# Patient Record
Sex: Female | Born: 1950
Health system: Southern US, Community
[De-identification: ages and names within clinical notes are randomized; demographics above are authoritative.]

## PROBLEM LIST (undated history)

## (undated) DIAGNOSIS — I10 Essential (primary) hypertension: Secondary | ICD-10-CM

## (undated) DIAGNOSIS — I509 Heart failure, unspecified: Secondary | ICD-10-CM

## (undated) HISTORY — PX: COLONOSCOPY: SHX174

## (undated) HISTORY — PX: ROTATOR CUFF REPAIR: SHX139

---

## 2000-04-16 DIAGNOSIS — I119 Hypertensive heart disease without heart failure: Secondary | ICD-10-CM | POA: Insufficient documentation

## 2003-12-29 ENCOUNTER — Ambulatory Visit: Payer: Self-pay | Admitting: Unknown Physician Specialty

## 2004-04-03 ENCOUNTER — Emergency Department: Payer: Self-pay | Admitting: Emergency Medicine

## 2004-06-02 ENCOUNTER — Emergency Department: Payer: Self-pay | Admitting: Unknown Physician Specialty

## 2004-06-03 ENCOUNTER — Emergency Department: Payer: Self-pay | Admitting: Emergency Medicine

## 2004-08-29 ENCOUNTER — Emergency Department: Payer: Self-pay | Admitting: Emergency Medicine

## 2004-09-04 ENCOUNTER — Emergency Department: Payer: Self-pay | Admitting: Emergency Medicine

## 2004-12-11 ENCOUNTER — Emergency Department: Payer: Self-pay | Admitting: Emergency Medicine

## 2004-12-20 ENCOUNTER — Other Ambulatory Visit: Payer: Self-pay

## 2005-01-31 ENCOUNTER — Ambulatory Visit: Payer: Self-pay | Admitting: General Practice

## 2005-04-20 ENCOUNTER — Emergency Department: Payer: Self-pay | Admitting: Emergency Medicine

## 2006-08-27 ENCOUNTER — Emergency Department: Payer: Self-pay

## 2006-08-27 ENCOUNTER — Other Ambulatory Visit: Payer: Self-pay

## 2006-10-29 ENCOUNTER — Ambulatory Visit: Payer: Self-pay | Admitting: Family Medicine

## 2006-11-06 ENCOUNTER — Ambulatory Visit: Payer: Self-pay | Admitting: Family Medicine

## 2007-04-27 ENCOUNTER — Other Ambulatory Visit: Payer: Self-pay

## 2007-04-27 ENCOUNTER — Emergency Department: Payer: Self-pay | Admitting: Emergency Medicine

## 2007-04-28 ENCOUNTER — Emergency Department: Payer: Self-pay | Admitting: Emergency Medicine

## 2007-09-18 ENCOUNTER — Other Ambulatory Visit: Payer: Self-pay

## 2007-09-18 ENCOUNTER — Emergency Department: Payer: Self-pay | Admitting: Emergency Medicine

## 2007-12-21 ENCOUNTER — Emergency Department: Payer: Self-pay | Admitting: Unknown Physician Specialty

## 2009-01-26 ENCOUNTER — Ambulatory Visit: Payer: Self-pay

## 2009-03-04 ENCOUNTER — Emergency Department: Payer: Self-pay | Admitting: Emergency Medicine

## 2009-03-07 ENCOUNTER — Emergency Department: Payer: Self-pay | Admitting: Emergency Medicine

## 2009-05-24 ENCOUNTER — Emergency Department: Payer: Self-pay | Admitting: Emergency Medicine

## 2009-06-28 ENCOUNTER — Ambulatory Visit: Payer: Self-pay | Admitting: Orthopedic Surgery

## 2010-03-11 ENCOUNTER — Ambulatory Visit: Payer: Self-pay

## 2010-05-24 ENCOUNTER — Emergency Department: Payer: Self-pay | Admitting: Emergency Medicine

## 2010-10-17 DIAGNOSIS — I5022 Chronic systolic (congestive) heart failure: Secondary | ICD-10-CM | POA: Insufficient documentation

## 2011-01-16 ENCOUNTER — Emergency Department: Payer: Self-pay | Admitting: *Deleted

## 2011-01-16 LAB — COMPREHENSIVE METABOLIC PANEL
Albumin: 3.7 g/dL (ref 3.4–5.0)
Alkaline Phosphatase: 86 U/L (ref 50–136)
Anion Gap: 5 — ABNORMAL LOW (ref 7–16)
BUN: 20 mg/dL — ABNORMAL HIGH (ref 7–18)
Bilirubin,Total: 0.4 mg/dL (ref 0.2–1.0)
Calcium, Total: 9.2 mg/dL (ref 8.5–10.1)
Chloride: 108 mmol/L — ABNORMAL HIGH (ref 98–107)
Co2: 29 mmol/L (ref 21–32)
Creatinine: 1.03 mg/dL (ref 0.60–1.30)
EGFR (African American): >60
EGFR (Non-African Amer.): 58 — ABNORMAL LOW
Glucose: 98 mg/dL (ref 65–99)
Osmolality: 286 (ref 275–301)
Potassium: 3.9 mmol/L (ref 3.5–5.1)
SGOT(AST): 19 U/L (ref 15–37)
SGPT (ALT): 21 U/L
Sodium: 142 mmol/L (ref 136–145)
Total Protein: 8 g/dL (ref 6.4–8.2)

## 2011-01-16 LAB — CBC
HCT: 33.2 % — ABNORMAL LOW (ref 35.0–47.0)
HGB: 11.1 g/dL — ABNORMAL LOW (ref 12.0–16.0)
MCH: 28.4 pg (ref 26.0–34.0)
MCHC: 33.5 g/dL (ref 32.0–36.0)
MCV: 85 fL (ref 80–100)
Platelet: 296 10*3/uL (ref 150–440)
RBC: 3.92 10*6/uL (ref 3.80–5.20)
RDW: 13.5 % (ref 11.5–14.5)
WBC: 6.9 10*3/uL (ref 3.6–11.0)

## 2011-01-16 LAB — URINALYSIS, COMPLETE
Bilirubin,UR: NEGATIVE
Blood: NEGATIVE
Glucose,UR: NEGATIVE mg/dL (ref 0–75)
Ketone: NEGATIVE
Nitrite: NEGATIVE
Ph: 6 (ref 4.5–8.0)
Protein: NEGATIVE
RBC,UR: 3 /HPF (ref 0–5)
Specific Gravity: 1.028 (ref 1.003–1.030)
Squamous Epithelial: 16
WBC UR: 8 /HPF (ref 0–5)

## 2011-01-16 LAB — TROPONIN I
Troponin-I: <0.02 ng/mL
Troponin-I: <0.02 ng/mL

## 2011-01-16 LAB — CK TOTAL AND CKMB (NOT AT ARMC)
CK, Total: 146 U/L (ref 21–215)
CK, Total: 118 U/L (ref 21–215)
CK-MB: 0.7 ng/mL (ref 0.5–3.6)
CK-MB: 0.8 ng/mL (ref 0.5–3.6)

## 2011-01-25 IMAGING — CT CT OF THE RIGHT SHOULDER WITHOUT CONTRAST
2 series · 10 of 14 positions shown, 12 images · non-contrast
Comparison: none

REASON FOR EXAM: right shoulder pain   eval rotator cuff tear
COMMENTS:

[Series 2: shoulder 3mm · axial · 0.39mm/px · z∈[-464,-388]mm · 4 of 43 slices shown]
[im 9/43  bone]
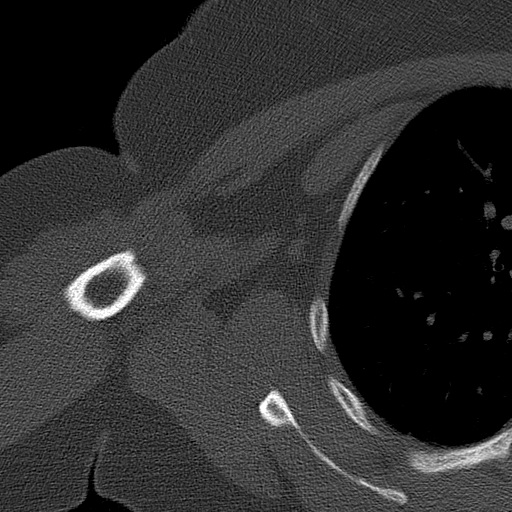
[im 17/43  bone]
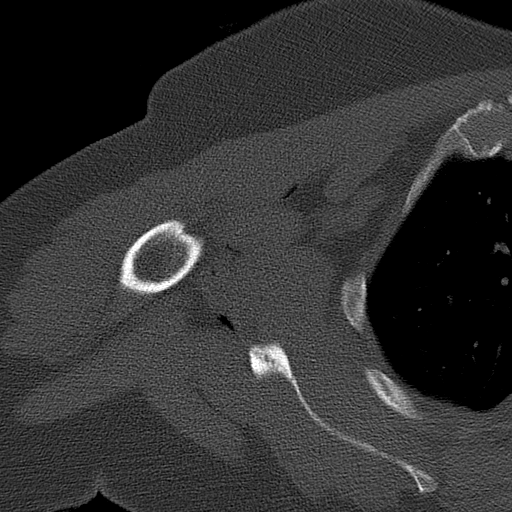
[im 26/43  bone]
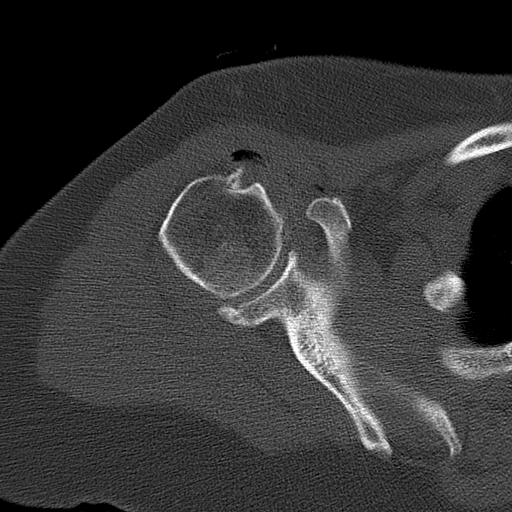
[im 34/43  bone]
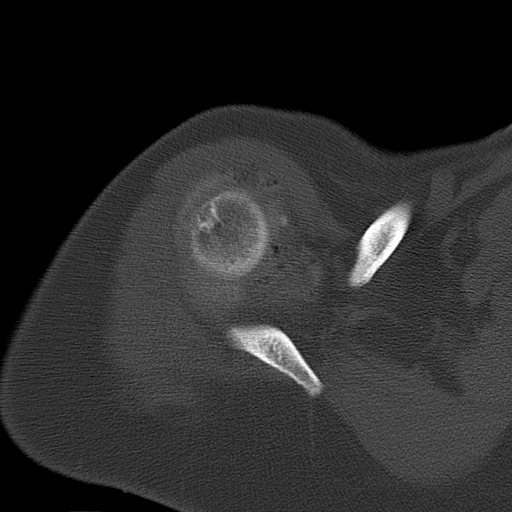

[Series 5: shoulder 2mm · axial · 0.39mm/px · z∈[-399,-320]mm · 6 of 61 slices shown, 8 images]
[im 9/61  soft-tissue]
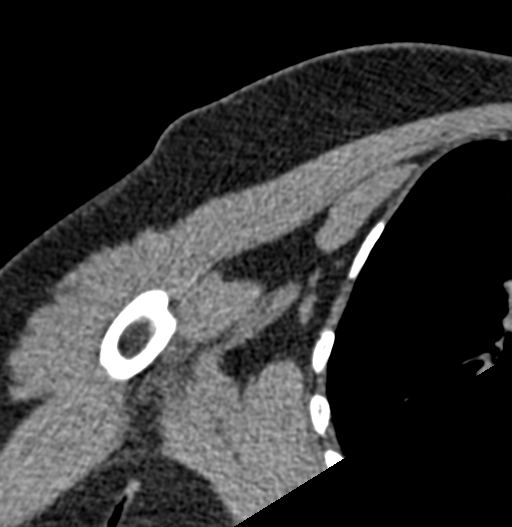
[im 9/61  bone]
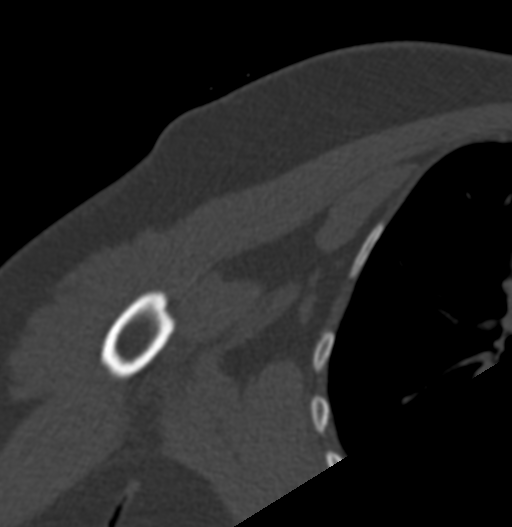
[im 18/61  bone]
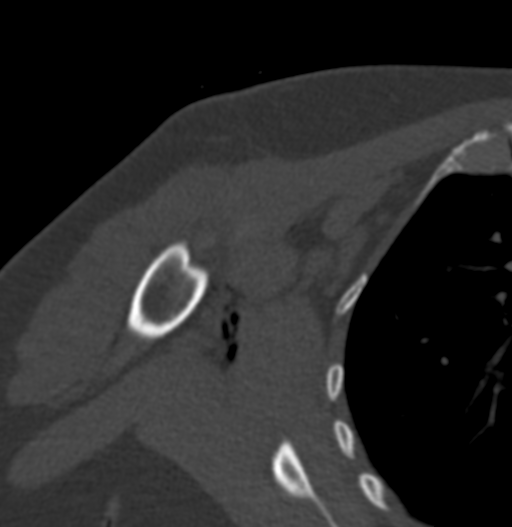
[im 26/61  bone]
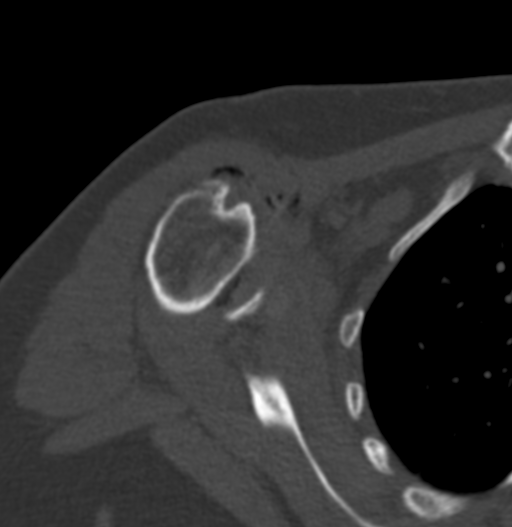
[im 35/61  bone]
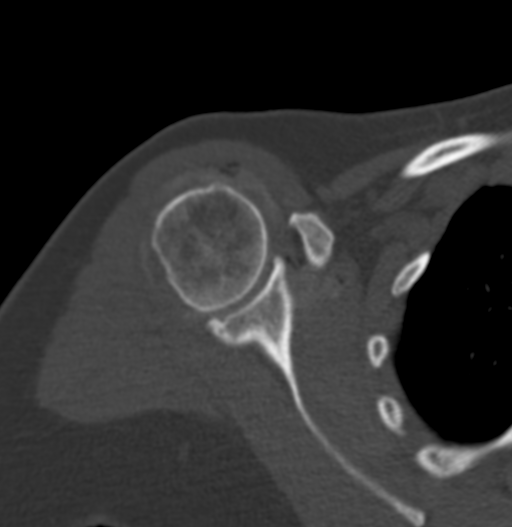
[im 43/61  soft-tissue]
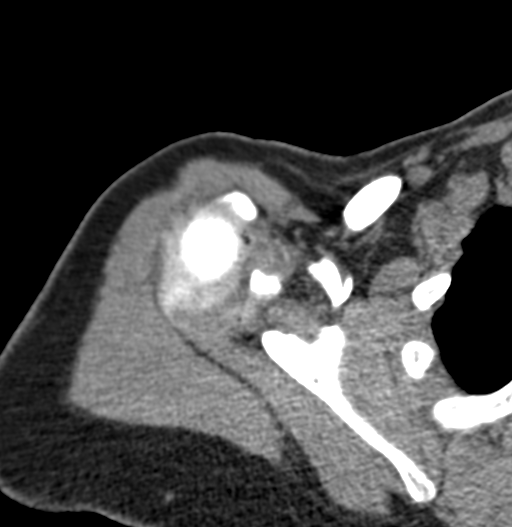
[im 43/61  bone]
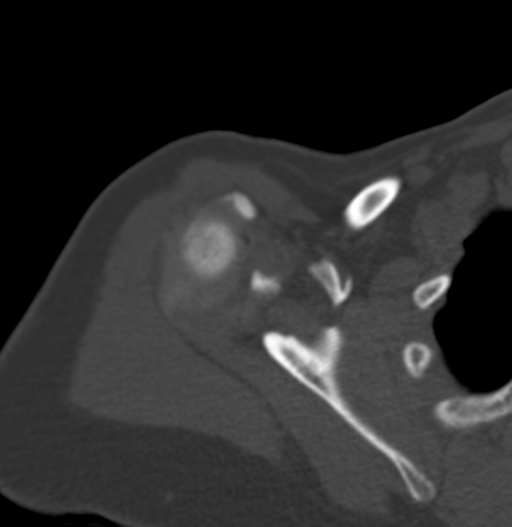
[im 52/61  bone]
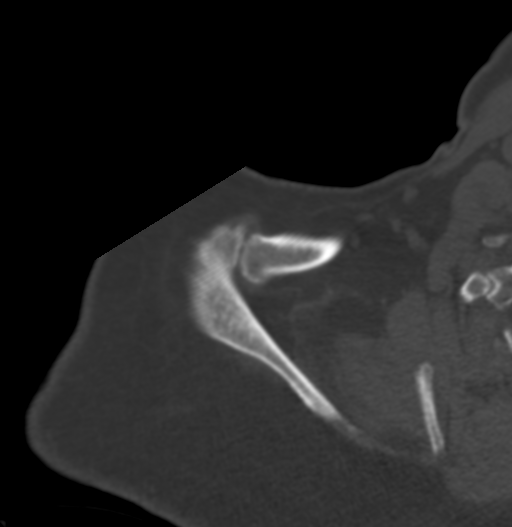

[10 of 14 positions shown; findings below may reference images not displayed]

PROCEDURE:     CT  - CT SHOULDER RIGHT WO  - June 28, 2009 [DATE]

RESULT:     Right shoulder CT was performed after arthrography. There
appears to be a complete rotator cuff tear. Acromioclavicular degenerative
change and subacromial spurring is present. The shoulder is high-riding
suggesting the possibility of chronicity of rotator cuff tear. No acute bony
abnormality identified.
IMPRESSION: Complete rotator cuff tear. Isolation of specific tendon
abnormalities is difficult on this study.

## 2011-09-11 ENCOUNTER — Emergency Department (HOSPITAL_COMMUNITY)
Admission: EM | Admit: 2011-09-11 | Discharge: 2011-09-11 | Disposition: A | Discharge status: Home / Self Care | Payer: Self-pay | Attending: Emergency Medicine | Admitting: Emergency Medicine

## 2011-09-11 ENCOUNTER — Emergency Department (HOSPITAL_COMMUNITY): Payer: Self-pay

## 2011-09-11 ENCOUNTER — Encounter (HOSPITAL_COMMUNITY): Payer: Self-pay | Admitting: *Deleted

## 2011-09-11 DIAGNOSIS — I10 Essential (primary) hypertension: Secondary | ICD-10-CM | POA: Insufficient documentation

## 2011-09-11 DIAGNOSIS — J020 Streptococcal pharyngitis: Secondary | ICD-10-CM | POA: Insufficient documentation

## 2011-09-11 HISTORY — DX: Essential (primary) hypertension: I10

## 2011-09-11 LAB — CBC WITH DIFFERENTIAL/PLATELET
Basophils Absolute: 0 10*3/uL (ref 0.0–0.1)
Basophils Relative: 0 % (ref 0–1)
Eosinophils Absolute: 0 10*3/uL (ref 0.0–0.7)
Eosinophils Relative: 0 % (ref 0–5)
HCT: 36 % (ref 36.0–46.0)
Hemoglobin: 12.4 g/dL (ref 12.0–15.0)
Lymphocytes Relative: 9 % — ABNORMAL LOW (ref 12–46)
Lymphs Abs: 1.1 10*3/uL (ref 0.7–4.0)
MCH: 28.8 pg (ref 26.0–34.0)
MCHC: 34.4 g/dL (ref 30.0–36.0)
MCV: 83.7 fL (ref 78.0–100.0)
Monocytes Absolute: 0.5 10*3/uL (ref 0.1–1.0)
Monocytes Relative: 4 % (ref 3–12)
Neutro Abs: 11.6 10*3/uL — ABNORMAL HIGH (ref 1.7–7.7)
Neutrophils Relative %: 87 % — ABNORMAL HIGH (ref 43–77)
Platelets: 343 10*3/uL (ref 150–400)
RBC: 4.3 MIL/uL (ref 3.87–5.11)
RDW: 13.8 % (ref 11.5–15.5)
WBC: 13.3 10*3/uL — ABNORMAL HIGH (ref 4.0–10.5)

## 2011-09-11 LAB — COMPREHENSIVE METABOLIC PANEL
ALT: 12 U/L (ref 0–35)
AST: 14 U/L (ref 0–37)
Albumin: 3.9 g/dL (ref 3.5–5.2)
Alkaline Phosphatase: 87 U/L (ref 39–117)
BUN: 13 mg/dL (ref 6–23)
CO2: 27 mEq/L (ref 19–32)
Calcium: 10.1 mg/dL (ref 8.4–10.5)
Chloride: 102 mEq/L (ref 96–112)
Creatinine, Ser: 1 mg/dL (ref 0.50–1.10)
GFR calc Af Amer: 70 mL/min — ABNORMAL LOW (ref >90)
GFR calc non Af Amer: 60 mL/min — ABNORMAL LOW (ref >90)
Glucose, Bld: 131 mg/dL — ABNORMAL HIGH (ref 70–99)
Potassium: 3.7 mEq/L (ref 3.5–5.1)
Sodium: 138 mEq/L (ref 135–145)
Total Bilirubin: 0.6 mg/dL (ref 0.3–1.2)
Total Protein: 8.4 g/dL — ABNORMAL HIGH (ref 6.0–8.3)

## 2011-09-11 LAB — RAPID STREP SCREEN (MED CTR MEBANE ONLY): Streptococcus, Group A Screen (Direct): POSITIVE — AB

## 2011-09-11 MED ORDER — ACETAMINOPHEN 325 MG PO TABS
650.0000 mg | ORAL_TABLET | Freq: Once | ORAL | Status: AC
  Administered 2011-09-11: 650 mg via ORAL
  Filled 2011-09-11: qty 2

## 2011-09-11 MED ORDER — PENICILLIN G BENZATHINE 1200000 UNIT/2ML IM SUSP
1.2000 10*6.[IU] | Freq: Once | INTRAMUSCULAR | Status: AC
  Administered 2011-09-11: 1.2 10*6.[IU] via INTRAMUSCULAR
  Filled 2011-09-11: qty 2

## 2011-09-11 MED ORDER — HYDROCODONE-ACETAMINOPHEN 5-325 MG PO TABS: ORAL_TABLET | ORAL | Status: AC

## 2011-09-11 NOTE — ED Notes (Signed)
Pt unable to void at present

## 2011-09-11 NOTE — ED Notes (Signed)
Pt c/o fever and chills, dry cough and a sore throat since Sunday. Pt reports her throat has gotten worse since last night.

## 2011-09-11 NOTE — ED Provider Notes (Signed)
History     CSN: [id number]  Arrival date & time 09/11/11  1610   First MD Initiated Contact with Patient 09/11/11 1848      Chief Complaint  Patient presents with  . cold symptoms     (Consider location/radiation/quality/duration/timing/severity/associated sxs/prior treatment) HPI Comments: Patient presents today with chief complaint of sore throat and chills. These symptoms began Sunday (2 days ago), she reports feeling feverish with chills and night sweats. Her sore throat also began Sunday and has gradually gotten worse. She reports having a non-productive cough without shortness of breath. She denies ear pain, rhinorrhea, congestion, nausea, vomiting, abdominal pain, shortness of breath, diarrhea, constipation and dysuria. She has not tried any medications to help with her symptoms. She has a friend from church with similar symptoms.   The history is provided by the patient.    Past Medical History  Diagnosis Date  . Hypertension     History reviewed. No pertinent past surgical history.  No family history on file.  History  Substance Use Topics  . Smoking status: Never Smoker   . Smokeless tobacco: Not on file  . Alcohol Use: No    OB History    Grav Para Term Preterm Abortions TAB SAB Ect Mult Living                  Review of Systems  Constitutional: Positive for fever (feeling feverish), chills and appetite change (decreased appetite).  HENT: Positive for sore throat and voice change. Negative for ear pain, congestion, rhinorrhea, sneezing and sinus pressure.   Eyes: Negative for redness.  Respiratory: Positive for cough (non productive).   Gastrointestinal: Negative for nausea, vomiting, abdominal pain, diarrhea and constipation.  Genitourinary: Negative for dysuria.  Musculoskeletal: Negative for myalgias.  Skin: Negative for rash.  Neurological: Positive for headaches.    Allergies  Review of patient's allergies indicates no known allergies.  Home  Medications   Current Outpatient Rx  Name Route Sig Dispense Refill  . CARVEDILOL 12.5 MG PO TABS Oral Take 12.5 mg by mouth 2 (two) times daily with a meal.    . HYDROCHLOROTHIAZIDE 25 MG PO TABS Oral Take 25 mg by mouth daily.    Marland Kitchen LISINOPRIL 10 MG PO TABS Oral Take 10 mg by mouth at bedtime.      BP 125/67  Pulse 97  Temp 99 F (37.2 C) (Oral)  Resp 14  SpO2 98%  Physical Exam  Nursing note and vitals reviewed. Constitutional: She appears well-developed and well-nourished.       In no acute distress  HENT:  Head: Normocephalic and atraumatic.  Right Ear: Hearing, tympanic membrane, external ear and ear canal normal.  Left Ear: Hearing, tympanic membrane, external ear and ear canal normal.  Mouth/Throat: Uvula is midline and mucous membranes are normal. Mucous membranes are not dry. No uvula swelling. Posterior oropharyngeal erythema present. No posterior oropharyngeal edema or tonsillar abscesses.  Eyes: Conjunctivae are normal. Right eye exhibits no discharge. Left eye exhibits no discharge.  Neck: Normal range of motion. Neck supple.  Cardiovascular: Normal rate, regular rhythm and normal heart sounds.   No murmur heard.      Tachycardic  Pulmonary/Chest: Effort normal and breath sounds normal. No respiratory distress. She has no wheezes. She has no rales.  Abdominal: Soft. There is no tenderness.  Lymphadenopathy:    She has no cervical adenopathy.  Neurological: She is alert.  Skin: Skin is warm and dry.  Psychiatric: She has a normal  mood and affect.    ED Course  Procedures (including critical care time)  Labs Reviewed  CBC WITH DIFFERENTIAL - Abnormal; Notable for the following:    WBC 13.3 (*)     Neutrophils Relative 87 (*)     Neutro Abs 11.6 (*)     Lymphocytes Relative 9 (*)     All other components within normal limits  COMPREHENSIVE METABOLIC PANEL - Abnormal; Notable for the following:    Glucose, Bld 131 (*)     Total Protein 8.4 (*)     GFR calc  non Af Amer 60 (*)     GFR calc Af Amer 70 (*)     All other components within normal limits  RAPID STREP SCREEN - Abnormal; Notable for the following:    Streptococcus, Group A Screen (Direct) POSITIVE (*)     All other components within normal limits  URINALYSIS, ROUTINE W REFLEX MICROSCOPIC   Dg Chest 2 View  09/11/2011  *RADIOLOGY REPORT*  Clinical Data: Temperature and cough.  CHEST - 2 VIEW  Comparison: None.  Findings: Upper normal heart size.  Mildly tortuous thoracic aorta. Normal pulmonary vascularity.  Low lung volumes.  No focal airspace disease is identified.  There is no pleural effusion or pneumothorax.  No acute osseous abnormality.  IMPRESSION: No acute cardiac pulmonary disease.  Heart size is upper normal.   Original Report Authenticated By: Britta Mccreedy, M.D.      1. Streptococcal pharyngitis     7:26 PM Patient seen and examined. Informed of results. Will treat with penicillin. Medications ordered.   Vital signs reviewed and are as follows: Filed Vitals:   09/11/11 1828  BP: 125/67  Pulse: 97  Temp: 99 F (37.2 C)  Resp: 14   Patient counseled on use of narcotic pain medications. Counseled not to combine these medications with others containing tylenol. Urged not to drink alcohol, drive, or perform any other activities that requires focus while taking these medications. The patient verbalizes understanding and agrees with the plan.  Patient urged to follow up with her primary care physician if not improved in 3 days. Urged to return with worsening symptoms, persistent high fever, trouble breathing, or any other concerns. She verbalizes understanding and agrees with the plan.   MDM  Patient with fever, chills, malaise, sore throat-positive strep test. Will treat for streptococcal pharyngitis. Patient has a cough, chest x-ray negative. No concern for pulmonary edema or congestive heart failure.        Renne Crigler, [place] 09/11/11 1941

## 2011-09-11 NOTE — ED Notes (Signed)
The pt  Is c/o being hot and cold since Sunday.  She has a cough and her throat is sore.  Non-productive cough

## 2011-09-13 NOTE — ED Provider Notes (Signed)
Medical screening examination/treatment/procedure(s) were performed by non-physician practitioner and as supervising physician I was immediately available for consultation/collaboration.   Dione Booze, MD 09/13/11 [phone]

## 2012-04-11 ENCOUNTER — Emergency Department (HOSPITAL_COMMUNITY)
Admission: EM | Admit: 2012-04-11 | Discharge: 2012-04-11 | Disposition: A | Discharge status: Home / Self Care | Payer: Medicare Other | Attending: Emergency Medicine | Admitting: Emergency Medicine

## 2012-04-11 ENCOUNTER — Emergency Department (HOSPITAL_COMMUNITY): Payer: Medicare Other

## 2012-04-11 ENCOUNTER — Encounter (HOSPITAL_COMMUNITY): Payer: Self-pay | Admitting: *Deleted

## 2012-04-11 DIAGNOSIS — Y9289 Other specified places as the place of occurrence of the external cause: Secondary | ICD-10-CM | POA: Insufficient documentation

## 2012-04-11 DIAGNOSIS — W010XXA Fall on same level from slipping, tripping and stumbling without subsequent striking against object, initial encounter: Secondary | ICD-10-CM | POA: Insufficient documentation

## 2012-04-11 DIAGNOSIS — I10 Essential (primary) hypertension: Secondary | ICD-10-CM | POA: Insufficient documentation

## 2012-04-11 DIAGNOSIS — Z79899 Other long term (current) drug therapy: Secondary | ICD-10-CM | POA: Insufficient documentation

## 2012-04-11 DIAGNOSIS — W19XXXA Unspecified fall, initial encounter: Secondary | ICD-10-CM

## 2012-04-11 DIAGNOSIS — Y9389 Activity, other specified: Secondary | ICD-10-CM | POA: Insufficient documentation

## 2012-04-11 DIAGNOSIS — M549 Dorsalgia, unspecified: Secondary | ICD-10-CM

## 2012-04-11 DIAGNOSIS — IMO0002 Reserved for concepts with insufficient information to code with codable children: Secondary | ICD-10-CM | POA: Insufficient documentation

## 2012-04-11 MED ORDER — METHOCARBAMOL 500 MG PO TABS: 500.0000 mg | ORAL_TABLET | Freq: Two times a day (BID) | ORAL | Status: DC

## 2012-04-11 MED ORDER — HYDROCODONE-ACETAMINOPHEN 5-325 MG PO TABS
2.0000 | ORAL_TABLET | Freq: Once | ORAL | Status: AC
  Administered 2012-04-11: 2 via ORAL
  Filled 2012-04-11: qty 2

## 2012-04-11 NOTE — ED Provider Notes (Signed)
Medical screening examination/treatment/procedure(s) were performed by non-physician practitioner and as supervising physician I was immediately available for consultation/collaboration.  Martha K Linker, MD 04/11/12 2339 

## 2012-04-11 NOTE — ED Notes (Signed)
The pt fell in the kitchen this am  She tripped over carpet and now has pain in her coccyx

## 2012-04-11 NOTE — ED Provider Notes (Signed)
History    This chart was scribed for non-physician practitioner working with Deborah Chick, MD by [name], ED Scribe. This patient was seen in room TR05C/TR05C and the patient's care was started at 8:51 PM.  CSN: [id number]  Arrival date & time 04/11/12  2026   First MD Initiated Contact with Patient 04/11/12 2038      Chief Complaint  Patient presents with  . Tailbone Pain    The history is provided by the patient. No language interpreter was used.    Deborah Holmes is a 62 y.o. female with h/o HTN, who presents to the ED c/o 4 hours of sudden onset, constant, severe coccyx pain. Pain is 10/10, worse with pressure, and alleviated by nothing. Per Pt, onset occurred after pushing a heavy object at work. Pain has increased acutely since onset. No loss of bowel or bladder function. Symptoms have not been treated PTA. No fever, chills, cough, congestion, rhinorrhea, chest pain, SOB, or n/v/d. Pt denies use of tobacco, alcohol, and illicit drug use. No pertinent surgical Hx is denoted.    Past Medical History  Diagnosis Date  . Hypertension     History reviewed. No pertinent past surgical history.  No family history on file.  History  Substance Use Topics  . Smoking status: Never Smoker   . Smokeless tobacco: Not on file  . Alcohol Use: No    Review of Systems  Musculoskeletal: Positive for back pain.  All other systems reviewed and are negative.    Allergies  Review of patient's allergies indicates no known allergies.  Home Medications   Current Outpatient Rx  Name  Route  Sig  Dispense  Refill  . Aspirin-Salicylamide-Caffeine (ARTHRITIS STRENGTH BC POWDER PO)   Oral   Take 1 packet by mouth daily as needed. For pain         . carvedilol (COREG) 12.5 MG tablet   Oral   Take 12.5 mg by mouth 2 (two) times daily with a meal.         . hydrochlorothiazide (HYDRODIURIL) 25 MG tablet   Oral   Take 25 mg by mouth daily.         Marland Kitchen lisinopril  (PRINIVIL,ZESTRIL) 10 MG tablet   Oral   Take 10 mg by mouth at bedtime.           BP 116/65  Pulse 100  Temp(Src) 98 F (36.7 C) (Oral)  Resp 16  SpO2 98%  Physical Exam  Nursing note and vitals reviewed. Constitutional: She is oriented to person, place, and time. She appears well-developed and well-nourished. No distress.  HENT:  Head: Normocephalic and atraumatic.  Eyes: EOM are normal.  Neck: Neck supple. No tracheal deviation present.  Cardiovascular: Normal rate.   Pulmonary/Chest: Effort normal. No respiratory distress.  Musculoskeletal: Normal range of motion.  No bony tenderness of the spine. No step-offs or deformities. Left hip mildly tender to palpation over the lateral aspect.   Neurological: She is alert and oriented to person, place, and time.  Skin: Skin is warm and dry.  Psychiatric: She has a normal mood and affect. Her behavior is normal.    ED Course  Procedures DIAGNOSTIC STUDIES: Oxygen Saturation is 98% on room air, normal by my interpretation.    COORDINATION OF CARE: 20:50- Evaluated Pt. Pt is awake, alert, and without distress. 20:53- Patient understand and agree with initial ED impression and plan with expectations set for ED visit. 20:54- Ordered DG Sacrum/Coccyx and DG  Hip complete left 1 time imaging.  Plan: Home Medications- Muscle relaxer ; Home Treatments- Cold compress; Recommended follow up- PCP if symptoms worsen.   Labs Reviewed - No data to display Dg Sacrum/coccyx  04/11/2012  *RADIOLOGY REPORT*  Clinical Data: Fall.  Sacrococcygeal pain.  SACRUM AND COCCYX - 2+ VIEW  Comparison: None.  Findings: No acute fracture identified.  No other significant bone abnormality demonstrated.  IMPRESSION: No acute findings.   Original Report Authenticated By: Myles Rosenthal, M.D.    Dg Hip Complete Left  04/11/2012  *RADIOLOGY REPORT*  Clinical Data: Fall.  Left hip injury and pain.  LEFT HIP - COMPLETE 2+ VIEW  Comparison: None.  Findings: No  evidence of fracture or dislocation.  Mild degenerative spurring is seen without significant joint space narrowing.  No other significant bone abnormality identified involving the hip.  Lower lumbar spine degenerative changes noted.  IMPRESSION:  1.  No acute findings. 2.  Mild left hip osteoarthritis. 3.  Lower lumbar spine degenerative changes.   Original Report Authenticated By: Myles Rosenthal, M.D.      1. Fall, initial encounter   2. Back pain       MDM  Patient with back pain.  No neurological deficits and normal neuro exam.  Patient can walk but states is painful.  No loss of bowel or bladder control.  No concern for cauda equina.  No fever, night sweats, weight loss, h/o cancer, IVDU.  RICE protocol and pain medicine indicated and discussed with patient.        I personally performed the services described in this documentation, which was scribed in my presence. The recorded information has been reviewed and is accurate.    Roxy Horseman, PA-C 04/11/12 2332

## 2012-04-26 DIAGNOSIS — M75101 Unspecified rotator cuff tear or rupture of right shoulder, not specified as traumatic: Secondary | ICD-10-CM | POA: Insufficient documentation

## 2012-04-26 DIAGNOSIS — Z90710 Acquired absence of both cervix and uterus: Secondary | ICD-10-CM | POA: Insufficient documentation

## 2012-04-26 DIAGNOSIS — Z9079 Acquired absence of other genital organ(s): Secondary | ICD-10-CM | POA: Insufficient documentation

## 2012-04-26 DIAGNOSIS — E669 Obesity, unspecified: Secondary | ICD-10-CM | POA: Insufficient documentation

## 2014-04-20 ENCOUNTER — Emergency Department (HOSPITAL_COMMUNITY): Payer: Medicare Other

## 2014-04-20 ENCOUNTER — Encounter (HOSPITAL_COMMUNITY): Payer: Self-pay | Admitting: *Deleted

## 2014-04-20 ENCOUNTER — Emergency Department (HOSPITAL_COMMUNITY)
Admission: EM | Admit: 2014-04-20 | Discharge: 2014-04-20 | Disposition: A | Discharge status: Home / Self Care | Payer: Medicare Other | Attending: Emergency Medicine | Admitting: Emergency Medicine

## 2014-04-20 DIAGNOSIS — Z79899 Other long term (current) drug therapy: Secondary | ICD-10-CM | POA: Diagnosis not present

## 2014-04-20 DIAGNOSIS — I509 Heart failure, unspecified: Secondary | ICD-10-CM | POA: Diagnosis not present

## 2014-04-20 DIAGNOSIS — Z7982 Long term (current) use of aspirin: Secondary | ICD-10-CM | POA: Insufficient documentation

## 2014-04-20 DIAGNOSIS — I10 Essential (primary) hypertension: Secondary | ICD-10-CM | POA: Insufficient documentation

## 2014-04-20 DIAGNOSIS — R0602 Shortness of breath: Secondary | ICD-10-CM | POA: Diagnosis present

## 2014-04-20 LAB — BASIC METABOLIC PANEL
Anion gap: 7 (ref 5–15)
BUN: 12 mg/dL (ref 6–23)
CO2: 27 mmol/L (ref 19–32)
Calcium: 9.1 mg/dL (ref 8.4–10.5)
Chloride: 108 mmol/L (ref 96–112)
Creatinine, Ser: 0.84 mg/dL (ref 0.50–1.10)
GFR calc Af Amer: 84 mL/min — ABNORMAL LOW (ref >90)
GFR calc non Af Amer: 72 mL/min — ABNORMAL LOW (ref >90)
Glucose, Bld: 106 mg/dL — ABNORMAL HIGH (ref 70–99)
Potassium: 4 mmol/L (ref 3.5–5.1)
Sodium: 142 mmol/L (ref 135–145)

## 2014-04-20 LAB — CBC
HCT: 32 % — ABNORMAL LOW (ref 36.0–46.0)
Hemoglobin: 10.4 g/dL — ABNORMAL LOW (ref 12.0–15.0)
MCH: 27.4 pg (ref 26.0–34.0)
MCHC: 32.5 g/dL (ref 30.0–36.0)
MCV: 84.4 fL (ref 78.0–100.0)
Platelets: 280 10*3/uL (ref 150–400)
RBC: 3.79 MIL/uL — ABNORMAL LOW (ref 3.87–5.11)
RDW: 13.6 % (ref 11.5–15.5)
WBC: 4.7 10*3/uL (ref 4.0–10.5)

## 2014-04-20 LAB — I-STAT TROPONIN, ED: Troponin i, poc: 0.01 ng/mL (ref 0.00–0.08)

## 2014-04-20 LAB — BRAIN NATRIURETIC PEPTIDE: B Natriuretic Peptide: 838.8 pg/mL — ABNORMAL HIGH (ref 0.0–100.0)

## 2014-04-20 MED ORDER — ASPIRIN 81 MG PO CHEW
324.0000 mg | CHEWABLE_TABLET | Freq: Once | ORAL | Status: AC
  Administered 2014-04-20: 324 mg via ORAL
  Filled 2014-04-20: qty 4

## 2014-04-20 MED ORDER — FUROSEMIDE 20 MG PO TABS: 20.0000 mg | ORAL_TABLET | Freq: Every day | ORAL | Status: DC

## 2014-04-20 MED ORDER — FUROSEMIDE 10 MG/ML IJ SOLN
40.0000 mg | Freq: Once | INTRAMUSCULAR | Status: AC
  Administered 2014-04-20: 40 mg via INTRAVENOUS
  Filled 2014-04-20: qty 4

## 2014-04-20 NOTE — ED Notes (Signed)
Pt. Reports SOB is getting better after lasix.

## 2014-04-20 NOTE — Discharge Instructions (Signed)
Please follow with your primary care doctor in the next 2 days for a check-up. They must obtain records for further management.  ° °Do not hesitate to return to the Emergency Department for any new, worsening or concerning symptoms.  ° °

## 2014-04-20 NOTE — ED Notes (Signed)
Pt staets that she has had cough, SOB and burning in chest for several day. Pt states that that SOB is made worse when flat.

## 2014-04-20 NOTE — ED Provider Notes (Signed)
CSN: [id number]     Arrival date & time 04/20/14  1152 History   First MD Initiated Contact with Patient 04/20/14 1324     Chief Complaint  Patient presents with  . Cough  . Shortness of Breath     (Consider location/radiation/quality/duration/timing/severity/associated sxs/prior Treatment) HPI  Deborah Holmes is a 64 y.o. female complaining of dry cough, wheezing when she lies down shortness of breath over the course of several weeks significantly worsening over the last 24 hours. Patient states that she has a burning sensation in her upper chest when she coughs but denies chest pain, increasing peripheral edema, paroxysmal nocturnal dyspnea, orthopnea, fever, chills, abdominal pain, nausea, vomiting, change in bowel or bladder habits. Reports single episode of dyspnea on exertion when walking a long distance yesterday and required a wheelchair. She is seen at [facility] for CHF (last EF was 35- 40% in 2015).  PCP [place]  Past Medical History  Diagnosis Date  . Hypertension    History reviewed. No pertinent past surgical history. No family history on file. History  Substance Use Topics  . Smoking status: Never Smoker   . Smokeless tobacco: Not on file  . Alcohol Use: No   OB History    No data available     Review of Systems  10 systems reviewed and found to be negative, except as noted in the HPI.   Allergies  Review of patient's allergies indicates no known allergies.  Home Medications   Prior to Admission medications   Medication Sig Start Date End Date Taking? Authorizing Provider  aspirin EC 81 MG tablet Take 81 mg by mouth daily.   Yes Historical Provider, MD  carvedilol (COREG) 12.5 MG tablet Take 12.5 mg by mouth 2 (two) times daily with a meal.   Yes Historical Provider, MD  lisinopril (PRINIVIL,ZESTRIL) 10 MG tablet Take 10 mg by mouth at bedtime.   Yes Historical Provider, MD  Menthol, Topical Analgesic, (BENGAY EX) Apply 1 application topically daily as  needed (pain).   Yes Historical Provider, MD  naproxen sodium (ANAPROX) 220 MG tablet Take 440 mg by mouth daily as needed (knee pain).   Yes Historical Provider, MD  OVER THE COUNTER MEDICATION Apply 1 application topically daily as needed (knee pain). OTC pain cream   Yes Historical Provider, MD  furosemide (LASIX) 20 MG tablet Take 1 tablet (20 mg total) by mouth daily. 04/20/14   Harrison Paulson, PA-C  methocarbamol (ROBAXIN) 500 MG tablet Take 1 tablet (500 mg total) by mouth 2 (two) times daily. Patient not taking: Reported on 04/20/2014 04/11/12   Montine Circle, PA-C   BP 156/94 mmHg  Pulse 92  Temp(Src) 98.2 F (36.8 C) (Oral)  Resp 21  SpO2 97% Physical Exam  Constitutional: She is oriented to person, place, and time. She appears well-developed and well-nourished. No distress.  HENT:  Head: Normocephalic.  Mouth/Throat: Oropharynx is clear and moist.  Eyes: Conjunctivae and EOM are normal. Pupils are equal, round, and reactive to light.  Cardiovascular: Normal rate, regular rhythm and intact distal pulses.   Pulmonary/Chest: Effort normal and breath sounds normal. No stridor. No respiratory distress. She has no wheezes. She has no rales. She exhibits no tenderness.  Abdominal: Soft. Bowel sounds are normal. She exhibits no distension and no mass. There is no tenderness. There is no rebound and no guarding.  Musculoskeletal: Normal range of motion.  Neurological: She is alert and oriented to person, place, and time.  Psychiatric: She has a  normal mood and affect.  Nursing note and vitals reviewed.   ED Course  Procedures (including critical care time) Labs Review Labs Reviewed  CBC - Abnormal; Notable for the following:    RBC 3.79 (*)    Hemoglobin 10.4 (*)    HCT 32.0 (*)    All other components within normal limits  BASIC METABOLIC PANEL - Abnormal; Notable for the following:    Glucose, Bld 106 (*)    GFR calc non Af Amer 72 (*)    GFR calc Af Amer 84 (*)    All  other components within normal limits  BRAIN NATRIURETIC PEPTIDE - Abnormal; Notable for the following:    B Natriuretic Peptide 838.8 (*)    All other components within normal limits  I-STAT TROPOININ, ED    Imaging Review Dg Chest 2 View  04/20/2014   CLINICAL DATA:  Dry cough, shortness of breath, wheezing, sternal chest pain and chest tightness for 3 weeks, extreme shortness of breath with exertion, history hypertension  EXAM: CHEST  2 VIEW  COMPARISON:  09/11/2011  FINDINGS: Enlargement of cardiac silhouette.  Slight pulmonary vascular congestion.  Mild tortuosity of thoracic aorta.  Minimal peribronchial thickening.  No definite infiltrate, pleural effusion or pneumothorax.  Osseous structures unremarkable.  IMPRESSION: Enlargement of cardiac silhouette.  Minimal bronchitic changes without infiltrate.   Electronically Signed   By: Lavonia Dana M.D.   On: 04/20/2014 13:17     EKG Interpretation   Date/Time:  Tuesday April 20 2014 12:13:06 EDT Ventricular Rate:  87 PR Interval:  148 QRS Duration: 88 QT Interval:  394 QTC Calculation: 474 R Axis:   22 Text Interpretation:  Normal sinus rhythm Left ventricular hypertrophy  with repolarization abnormality Abnormal ECG Sinus rhythm T wave  abnormality Abnormal ekg Confirmed by Carmin Muskrat  MD ([id number]) on  04/20/2014 12:21:50 PM      MDM   Final diagnoses:  CHF exacerbation    Filed Vitals:   04/20/14 1445 04/20/14 1503 04/20/14 1530 04/20/14 1555  BP: 151/96 154/95 141/85 156/94  Pulse: 103 90 89 92  Temp:      TempSrc:      Resp: 26 20 18 21   SpO2: 98% 96% 100% 97%    Medications  aspirin chewable tablet 324 mg (324 mg Oral Given 04/20/14 1408)  furosemide (LASIX) injection 40 mg (40 mg Intravenous Given 04/20/14 1408)    [name] is a pleasant 64 y.o. female presenting with shortness of breath worsening over the course of 2 weeks, patient had a severe episode of dyspnea on exertion yesterday. Clinically  patient is not volume overloaded. Chest x-ray shows cardiomegaly with no frank pulmonary edema. Pro BNP is elevated at over 800. Discussed case with attending her recommend Lasix, Lasix is administered and patient has reduced a significant amount of urine and she reports improvement in her shortness of breath. Attending physician Dr. Mingo Amber has talked to her cardiologist at [facility] Dr. Ishmael Holter who has been advised of the patient's clinical condition and that we are starting her on Lasix to go home with. He will expedite an appointment.  This is a shared visit with the attending physician who personally evaluated the patient and agrees with the care plan.   Evaluation does not show pathology that would require ongoing emergent intervention or inpatient treatment. Pt is hemodynamically stable and mentating appropriately. Discussed findings and plan with patient/guardian, who agrees with care plan. All questions answered. Return precautions discussed and outpatient follow  up given.   New Prescriptions   FUROSEMIDE (LASIX) 20 MG TABLET    Take 1 tablet (20 mg total) by mouth daily.         Monico Blitz, PA-C 04/20/14 1601  Evelina Bucy, MD 04/20/14 [phone]

## 2014-04-20 NOTE — ED Notes (Signed)
Pt. Reports progressively increased SOB especially with exertion and laying flat

## 2014-05-26 ENCOUNTER — Encounter (HOSPITAL_COMMUNITY): Payer: Self-pay | Admitting: Emergency Medicine

## 2014-05-26 ENCOUNTER — Emergency Department (HOSPITAL_COMMUNITY)
Admission: EM | Admit: 2014-05-26 | Discharge: 2014-05-26 | Disposition: A | Discharge status: Home / Self Care | Payer: Medicare Other | Attending: Emergency Medicine | Admitting: Emergency Medicine

## 2014-05-26 ENCOUNTER — Emergency Department (HOSPITAL_COMMUNITY): Payer: Medicare Other

## 2014-05-26 DIAGNOSIS — Z79899 Other long term (current) drug therapy: Secondary | ICD-10-CM | POA: Diagnosis not present

## 2014-05-26 DIAGNOSIS — R05 Cough: Secondary | ICD-10-CM | POA: Diagnosis present

## 2014-05-26 DIAGNOSIS — I10 Essential (primary) hypertension: Secondary | ICD-10-CM | POA: Insufficient documentation

## 2014-05-26 DIAGNOSIS — Z7982 Long term (current) use of aspirin: Secondary | ICD-10-CM | POA: Diagnosis not present

## 2014-05-26 DIAGNOSIS — J4520 Mild intermittent asthma, uncomplicated: Secondary | ICD-10-CM

## 2014-05-26 DIAGNOSIS — J4521 Mild intermittent asthma with (acute) exacerbation: Secondary | ICD-10-CM | POA: Insufficient documentation

## 2014-05-26 DIAGNOSIS — R059 Cough, unspecified: Secondary | ICD-10-CM

## 2014-05-26 MED ORDER — ACETAMINOPHEN 500 MG PO TABS
1000.0000 mg | ORAL_TABLET | Freq: Once | ORAL | Status: AC
  Administered 2014-05-26: 1000 mg via ORAL
  Filled 2014-05-26: qty 2

## 2014-05-26 MED ORDER — DEXAMETHASONE SODIUM PHOSPHATE 10 MG/ML IJ SOLN
10.0000 mg | Freq: Once | INTRAMUSCULAR | Status: AC
  Administered 2014-05-26: 10 mg via INTRAMUSCULAR
  Filled 2014-05-26: qty 1

## 2014-05-26 MED ORDER — ALBUTEROL SULFATE (2.5 MG/3ML) 0.083% IN NEBU
5.0000 mg | INHALATION_SOLUTION | Freq: Once | RESPIRATORY_TRACT | Status: AC
  Administered 2014-05-26: 5 mg via RESPIRATORY_TRACT
  Filled 2014-05-26: qty 6

## 2014-05-26 MED ORDER — AZITHROMYCIN 250 MG PO TABS: ORAL_TABLET | ORAL | Status: DC

## 2014-05-26 MED ORDER — PREDNISONE 20 MG PO TABS: 40.0000 mg | ORAL_TABLET | Freq: Every day | ORAL | Status: DC

## 2014-05-26 MED ORDER — GUAIFENESIN-CODEINE 100-10 MG/5ML PO SOLN: 5.0000 mL | Freq: Four times a day (QID) | ORAL | Status: DC | PRN

## 2014-05-26 NOTE — Discharge Instructions (Signed)
Cough, Adult  A cough is a reflex that helps clear your throat and airways. It can help heal the body or may be a reaction to an irritated airway. A cough may only last 2 or 3 weeks (acute) or may last more than 8 weeks (chronic).  CAUSES Acute cough: 1. Viral or bacterial infections. Chronic cough: 1. Infections. 2. Allergies. 3. Asthma. 4. Post-nasal drip. 5. Smoking. 6. Heartburn or acid reflux. 7. Some medicines. 8. Chronic lung problems (COPD). 9. Cancer. SYMPTOMS   Cough.  Fever.  Chest pain.  Increased breathing rate.  High-pitched whistling sound when breathing (wheezing).  Colored mucus that you cough up (sputum). TREATMENT   A bacterial cough may be treated with antibiotic medicine.  A viral cough must run its course and will not respond to antibiotics.  Your caregiver may recommend other treatments if you have a chronic cough. HOME CARE INSTRUCTIONS   Only take over-the-counter or prescription medicines for pain, discomfort, or fever as directed by your caregiver. Use cough suppressants only as directed by your caregiver.  Use a cold steam vaporizer or humidifier in your bedroom or home to help loosen secretions.  Sleep in a semi-upright position if your cough is worse at night.  Rest as needed.  Stop smoking if you smoke. SEEK IMMEDIATE MEDICAL CARE IF:   You have pus in your sputum.  Your cough starts to worsen.  You cannot control your cough with suppressants and are losing sleep.  You begin coughing up blood.  You have difficulty breathing.  You develop pain which is getting worse or is uncontrolled with medicine.  You have a fever. MAKE SURE YOU:   Understand these instructions.  Will watch your condition.  Will get help right away if you are not doing well or get worse. Document Released: 06/16/2010 Document Revised: 03/12/2011 Document Reviewed: 06/16/2010 [facility] Patient Information 2015 [place], [place]. This information is not  intended to replace advice given to you by your health care provider. Make sure you discuss any questions you have with your health care provider.  Fever, Adult A fever is a higher than normal body temperature. In an adult, an oral temperature around 98.6 F (37 C) is considered normal. A temperature of 100.4 F (38 C) or higher is generally considered a fever. Mild or moderate fevers generally have no long-term effects and often do not require treatment. Extreme fever (greater than or equal to 106 F or 41.1 C) can cause seizures. The sweating that may occur with repeated or prolonged fever may cause dehydration. Elderly people can develop confusion during a fever. A measured temperature can vary with: 2. Age. 3. Time of day. 4. Method of measurement (mouth, underarm, rectal, or ear). The fever is confirmed by taking a temperature with a thermometer. Temperatures can be taken different ways. Some methods are accurate and some are not. 10. An oral temperature is used most commonly. Electronic thermometers are fast and accurate. 11. An ear temperature will only be accurate if the thermometer is positioned as recommended by the manufacturer. 12. A rectal temperature is accurate and done for those adults who have a condition where an oral temperature cannot be taken. 13. An underarm (axillary) temperature is not accurate and not recommended. Fever is a symptom, not a disease.  CAUSES   Infections commonly cause fever.  Some noninfectious causes for fever include:  Some arthritis conditions.  Some thyroid or adrenal gland conditions.  Some immune system conditions.  Some types of cancer.  A medicine reaction.  High doses of certain street drugs such as methamphetamine.  Dehydration.  Exposure to high outside or room temperatures.  Occasionally, the source of a fever cannot be determined. This is sometimes called a "fever of unknown origin" (FUO).  Some situations may lead to a  temporary rise in body temperature that may go away on its own. Examples are:  Childbirth.  Surgery.  Intense exercise. HOME CARE INSTRUCTIONS   Take appropriate medicines for fever. Follow dosing instructions carefully. If you use acetaminophen to reduce the fever, be careful to avoid taking other medicines that also contain acetaminophen. Do not take aspirin for a fever if you are younger than age 39. There is an association with Reye's syndrome. Reye's syndrome is a rare but potentially deadly disease.  If an infection is present and antibiotics have been prescribed, take them as directed. Finish them even if you start to feel better.  Rest as needed.  Maintain an adequate fluid intake. To prevent dehydration during an illness with prolonged or recurrent fever, you may need to drink extra fluid.Drink enough fluids to keep your urine clear or pale yellow.  Sponging or bathing with room temperature water may help reduce body temperature. Do not use ice water or alcohol sponge baths.  Dress comfortably, but do not over-bundle. SEEK MEDICAL CARE IF:   You are unable to keep fluids down.  You develop vomiting or diarrhea.  You are not feeling at least partly better after 3 days.  You develop new symptoms or problems. SEEK IMMEDIATE MEDICAL CARE IF:   You have shortness of breath or trouble breathing.  You develop excessive weakness.  You are dizzy or you faint.  You are extremely thirsty or you are making little or no urine.  You develop new pain that was not there before (such as in the head, neck, chest, back, or abdomen).  You have persistent vomiting and diarrhea for more than 1 to 2 days.  You develop a stiff neck or your eyes become sensitive to light.  You develop a skin rash.  You have a fever or persistent symptoms for more than 2 to 3 days.  You have a fever and your symptoms suddenly get worse. MAKE SURE YOU:   Understand these instructions.  Will watch  your condition.  Will get help right away if you are not doing well or get worse. Document Released: 06/13/2000 Document Revised: 05/04/2013 Document Reviewed: 10/19/2010 [facility] Patient Information 2015 [place], [place]. This information is not intended to replace advice given to you by your health care provider. Make sure you discuss any questions you have with your health care provider.  How to Use an Inhaler Proper inhaler technique is very important. Good technique ensures that the medicine reaches the lungs. Poor technique results in depositing the medicine on the tongue and back of the throat rather than in the airways. If you do not use the inhaler with good technique, the medicine will not help you. STEPS TO FOLLOW IF USING AN INHALER WITHOUT AN EXTENSION TUBE 5. Remove the cap from the inhaler. 6. If you are using the inhaler for the first time, you will need to prime it. Shake the inhaler for 5 seconds and release four puffs into the air, away from your face. Ask your health care provider or pharmacist if you have questions about priming your inhaler. 7. Shake the inhaler for 5 seconds before each breath in (inhalation). 8. Position the inhaler so that the top  of the canister faces up. 9. Put your index finger on the top of the medicine canister. Your thumb supports the bottom of the inhaler. 10. Open your mouth. 11. Either place the inhaler between your teeth and place your lips tightly around the mouthpiece, or hold the inhaler 1-2 inches away from your open mouth. If you are unsure of which technique to use, ask your health care provider. 12. Breathe out (exhale) normally and as completely as possible. 13. Press the canister down with your index finger to release the medicine. 14. At the same time as the canister is pressed, inhale deeply and slowly until your lungs are completely filled. This should take 4-6 seconds. Keep your tongue down. 15. Hold the medicine in your lungs for  5-10 seconds (10 seconds is best). This helps the medicine get into the small airways of your lungs. 16. Breathe out slowly, through pursed lips. Whistling is an example of pursed lips. 17. Wait at least 15-30 seconds between puffs. Continue with the above steps until you have taken the number of puffs your health care provider has ordered. Do not use the inhaler more than your health care provider tells you. 18. Replace the cap on the inhaler. 19. Follow the directions from your health care provider or the inhaler insert for cleaning the inhaler. STEPS TO FOLLOW IF USING AN INHALER WITH AN EXTENSION (SPACER) 14. Remove the cap from the inhaler. 15. If you are using the inhaler for the first time, you will need to prime it. Shake the inhaler for 5 seconds and release four puffs into the air, away from your face. Ask your health care provider or pharmacist if you have questions about priming your inhaler. 16. Shake the inhaler for 5 seconds before each breath in (inhalation). 17. Place the open end of the spacer onto the mouthpiece of the inhaler. 18. Position the inhaler so that the top of the canister faces up and the spacer mouthpiece faces you. 19. Put your index finger on the top of the medicine canister. Your thumb supports the bottom of the inhaler and the spacer. 20. Breathe out (exhale) normally and as completely as possible. 21. Immediately after exhaling, place the spacer between your teeth and into your mouth. Close your lips tightly around the spacer. 22. Press the canister down with your index finger to release the medicine. 23. At the same time as the canister is pressed, inhale deeply and slowly until your lungs are completely filled. This should take 4-6 seconds. Keep your tongue down and out of the way. 24. Hold the medicine in your lungs for 5-10 seconds (10 seconds is best). This helps the medicine get into the small airways of your lungs. Exhale. 25. Repeat inhaling deeply  through the spacer mouthpiece. Again hold that breath for up to 10 seconds (10 seconds is best). Exhale slowly. If it is difficult to take this second deep breath through the spacer, breathe normally several times through the spacer. Remove the spacer from your mouth. 26. Wait at least 15-30 seconds between puffs. Continue with the above steps until you have taken the number of puffs your health care provider has ordered. Do not use the inhaler more than your health care provider tells you. 27. Remove the spacer from the inhaler, and place the cap on the inhaler. 28. Follow the directions from your health care provider or the inhaler insert for cleaning the inhaler and spacer. If you are using different kinds of inhalers, use your  quick relief medicine to open the airways 10-15 minutes before using a steroid if instructed to do so by your health care provider. If you are unsure which inhalers to use and the order of using them, ask your health care provider, nurse, or respiratory therapist. If you are using a steroid inhaler, always rinse your mouth with water after your last puff, then gargle and spit out the water. Do not swallow the water. AVOID:  Inhaling before or after starting the spray of medicine. It takes practice to coordinate your breathing with triggering the spray.  Inhaling through the nose (rather than the mouth) when triggering the spray. HOW TO DETERMINE IF YOUR INHALER IS FULL OR NEARLY EMPTY You cannot know when an inhaler is empty by shaking it. A few inhalers are now being made with dose counters. Ask your health care provider for a prescription that has a dose counter if you feel you need that extra help. If your inhaler does not have a counter, ask your health care provider to help you determine the date you need to refill your inhaler. Write the refill date on a calendar or your inhaler canister. Refill your inhaler 7-10 days before it runs out. Be sure to keep an adequate supply  of medicine. This includes making sure it is not expired, and that you have a spare inhaler.  SEEK MEDICAL CARE IF:   Your symptoms are only partially relieved with your inhaler.  You are having trouble using your inhaler.  You have some increase in phlegm. SEEK IMMEDIATE MEDICAL CARE IF:   You feel little or no relief with your inhalers. You are still wheezing and are feeling shortness of breath or tightness in your chest or both.  You have dizziness, headaches, or a fast heart rate.  You have chills, fever, or night sweats.  You have a noticeable increase in phlegm production, or there is blood in the phlegm. MAKE SURE YOU:   Understand these instructions.  Will watch your condition.  Will get help right away if you are not doing well or get worse. Document Released: 12/16/1999 Document Revised: 10/08/2012 Document Reviewed: 07/17/2012 [facility] Patient Information 2015 [place], [place]. This information is not intended to replace advice given to you by your health care provider. Make sure you discuss any questions you have with your health care provider.

## 2014-05-26 NOTE — ED Notes (Signed)
Pt. reports persistent dry cough with chills and low grade fever onset last week .

## 2014-05-26 NOTE — ED Provider Notes (Signed)
CSN: [id number]     Arrival date & time 05/26/14  1945 History  This chart was scribed for non-physician practitioner, Margarita Mail, PA-C working with Ernestina Patches, MD by Hansel Feinstein, ED scribe. This patient was seen in room TR05C/TR05C and the patient's care was started at 9:00 PM     Chief Complaint  Patient presents with  . Cough   The history is provided by the patient. No language interpreter was used.    HPI Comments: Deborah Holmes is a 64 y.o. female with no Hx of asthma who presents to the Emergency Department complaining of intermittent dry cough onset 4 days PTA. She also reports associated subjective fever. She states she is not a smoker, but she has smoke contact at home. She states she has sick contact at home from a grandchild with similar Sx. She states she has a Hx of HTN and CHF. She denies Hx of DM.   Past Medical History  Diagnosis Date  . Hypertension    Past Surgical History  Procedure Laterality Date  . Rotator cuff repair     No family history on file. History  Substance Use Topics  . Smoking status: Never Smoker   . Smokeless tobacco: Not on file  . Alcohol Use: No   OB History    No data available     Review of Systems  Constitutional: Positive for fever.  Respiratory: Positive for cough.   Ten systems reviewed and are negative for acute change, except as noted in the HPI.      Allergies  Review of patient's allergies indicates no known allergies.  Home Medications   Prior to Admission medications   Medication Sig Start Date End Date Taking? Authorizing Provider  aspirin EC 81 MG tablet Take 81 mg by mouth daily.    Historical Provider, MD  carvedilol (COREG) 12.5 MG tablet Take 12.5 mg by mouth 2 (two) times daily with a meal.    Historical Provider, MD  furosemide (LASIX) 20 MG tablet Take 1 tablet (20 mg total) by mouth daily. 04/20/14   Nicole Pisciotta, PA-C  lisinopril (PRINIVIL,ZESTRIL) 10 MG tablet Take 10 mg by mouth at bedtime.     Historical Provider, MD  Menthol, Topical Analgesic, (BENGAY EX) Apply 1 application topically daily as needed (pain).    Historical Provider, MD  methocarbamol (ROBAXIN) 500 MG tablet Take 1 tablet (500 mg total) by mouth 2 (two) times daily. Patient not taking: Reported on 04/20/2014 04/11/12   Montine Circle, PA-C  naproxen sodium (ANAPROX) 220 MG tablet Take 440 mg by mouth daily as needed (knee pain).    Historical Provider, MD  OVER THE COUNTER MEDICATION Apply 1 application topically daily as needed (knee pain). OTC pain cream    Historical Provider, MD   BP 147/88 mmHg  Pulse 112  Temp(Src) 100.1 F (37.8 C) (Oral)  Resp 18  SpO2 96% Physical Exam  Constitutional: She is oriented to person, place, and time. She appears well-developed and well-nourished. No distress.  HENT:  Head: Normocephalic and atraumatic.  Eyes: Conjunctivae and EOM are normal. No scleral icterus.  Neck: Normal range of motion. Neck supple.  Cardiovascular: Normal rate, regular rhythm and normal heart sounds.  Exam reveals no gallop and no friction rub.   No murmur heard. Pulmonary/Chest: Effort normal. No respiratory distress. She has wheezes.  Abdominal: Soft. Bowel sounds are normal. She exhibits no distension and no mass. There is no tenderness. There is no guarding.  Neurological: She  is alert and oriented to person, place, and time.  Skin: Skin is warm and dry. She is not diaphoretic.  Psychiatric: Her behavior is normal.  Nursing note and vitals reviewed.   ED Course  Procedures (including critical care time) DIAGNOSTIC STUDIES: Oxygen Saturation is 96% on RA, adequate by my interpretation.    COORDINATION OF CARE: 9:06 PM Discussed treatment plan with pt at bedside which includes pain relief and nebulizer treatments. Pt agreed to plan.   Labs Review Labs Reviewed - No data to display  Imaging Review No results found.   EKG Interpretation None      MDM   Final diagnoses:  RAD  (reactive airway disease), mild intermittent, uncomplicated    Patient with out signs of pneumonia on her chest x-ray. She appears to have mild reactive airway. Her granddaughter has similar symptoms. Fever. Treated him emergency department. She will be discharged with antibiotics, prednisone and albuterol. She appears safe for discharge at this time. Follow up with PCP in the next 2 days. I discussed return precautions with the patient. Seen in shared visit with Dr. Tawnya Crook I personally performed the services described in this documentation, which was scribed in my presence. The recorded information has been reviewed and is accurate.      Margarita Mail, PA-C 06/01/14 2015  Ernestina Patches, MD 06/03/14 1055

## 2014-10-14 ENCOUNTER — Emergency Department (HOSPITAL_COMMUNITY)
Admission: EM | Admit: 2014-10-14 | Discharge: 2014-10-14 | Disposition: A | Discharge status: Home / Self Care | Payer: Medicare Other | Attending: Emergency Medicine | Admitting: Emergency Medicine

## 2014-10-14 ENCOUNTER — Emergency Department (HOSPITAL_COMMUNITY): Payer: Medicare Other

## 2014-10-14 ENCOUNTER — Encounter (HOSPITAL_COMMUNITY): Payer: Self-pay | Admitting: Emergency Medicine

## 2014-10-14 DIAGNOSIS — J40 Bronchitis, not specified as acute or chronic: Secondary | ICD-10-CM

## 2014-10-14 DIAGNOSIS — I10 Essential (primary) hypertension: Secondary | ICD-10-CM | POA: Insufficient documentation

## 2014-10-14 DIAGNOSIS — J209 Acute bronchitis, unspecified: Secondary | ICD-10-CM | POA: Insufficient documentation

## 2014-10-14 DIAGNOSIS — Z79899 Other long term (current) drug therapy: Secondary | ICD-10-CM | POA: Insufficient documentation

## 2014-10-14 DIAGNOSIS — Z7982 Long term (current) use of aspirin: Secondary | ICD-10-CM | POA: Diagnosis not present

## 2014-10-14 DIAGNOSIS — R0602 Shortness of breath: Secondary | ICD-10-CM | POA: Diagnosis present

## 2014-10-14 DIAGNOSIS — R079 Chest pain, unspecified: Secondary | ICD-10-CM | POA: Diagnosis not present

## 2014-10-14 DIAGNOSIS — Z7952 Long term (current) use of systemic steroids: Secondary | ICD-10-CM | POA: Diagnosis not present

## 2014-10-14 LAB — CBC
HCT: 33.2 % — ABNORMAL LOW (ref 36.0–46.0)
Hemoglobin: 10.6 g/dL — ABNORMAL LOW (ref 12.0–15.0)
MCH: 27.4 pg (ref 26.0–34.0)
MCHC: 31.9 g/dL (ref 30.0–36.0)
MCV: 85.8 fL (ref 78.0–100.0)
Platelets: 265 10*3/uL (ref 150–400)
RBC: 3.87 MIL/uL (ref 3.87–5.11)
RDW: 13.4 % (ref 11.5–15.5)
WBC: 5.8 10*3/uL (ref 4.0–10.5)

## 2014-10-14 LAB — BASIC METABOLIC PANEL
Anion gap: 7 (ref 5–15)
BUN: 10 mg/dL (ref 6–20)
CO2: 27 mmol/L (ref 22–32)
Calcium: 9.2 mg/dL (ref 8.9–10.3)
Chloride: 106 mmol/L (ref 101–111)
Creatinine, Ser: 0.82 mg/dL (ref 0.44–1.00)
GFR calc Af Amer: >60 mL/min (ref >60)
GFR calc non Af Amer: >60 mL/min (ref >60)
Glucose, Bld: 102 mg/dL — ABNORMAL HIGH (ref 65–99)
Potassium: 4.1 mmol/L (ref 3.5–5.1)
Sodium: 140 mmol/L (ref 135–145)

## 2014-10-14 LAB — I-STAT TROPONIN, ED: Troponin i, poc: 0 ng/mL (ref 0.00–0.08)

## 2014-10-14 LAB — D-DIMER, QUANTITATIVE (NOT AT ARMC): D-Dimer, Quant: 0.74 ug/mL-FEU — ABNORMAL HIGH (ref 0.00–0.48)

## 2014-10-14 LAB — BRAIN NATRIURETIC PEPTIDE: B Natriuretic Peptide: 512.7 pg/mL — ABNORMAL HIGH (ref 0.0–100.0)

## 2014-10-14 MED ORDER — IOHEXOL 350 MG/ML SOLN
100.0000 mL | Freq: Once | INTRAVENOUS | Status: AC | PRN
  Administered 2014-10-14: 100 mL via INTRAVENOUS

## 2014-10-14 MED ORDER — ALBUTEROL SULFATE (2.5 MG/3ML) 0.083% IN NEBU
5.0000 mg | INHALATION_SOLUTION | Freq: Once | RESPIRATORY_TRACT | Status: DC
  Filled 2014-10-14: qty 6

## 2014-10-14 MED ORDER — AZITHROMYCIN 250 MG PO TABS: 250.0000 mg | ORAL_TABLET | Freq: Every day | ORAL | Status: DC

## 2014-10-14 MED ORDER — IPRATROPIUM-ALBUTEROL 0.5-2.5 (3) MG/3ML IN SOLN
3.0000 mL | Freq: Once | RESPIRATORY_TRACT | Status: AC
  Administered 2014-10-14: 3 mL via RESPIRATORY_TRACT
  Filled 2014-10-14: qty 3

## 2014-10-14 NOTE — ED Notes (Signed)
Pt reports SOB, wheezing and chest pains for the last 3 weeks. States has tried nothing to help the pain. Lungs CTA, pt NAD at present.

## 2014-10-14 NOTE — ED Notes (Signed)
Pt called for triage x3

## 2014-10-14 NOTE — ED Provider Notes (Signed)
No pulmonary embolism found, patient given copy of her results, informed of lung nodule and need for follow-up  Noemi Chapel, MD 10/14/14 [phone]

## 2014-10-14 NOTE — ED Provider Notes (Signed)
CSN: [id number]     Arrival date & time 10/14/14  1352 History   First MD Initiated Contact with Patient 10/14/14 1422     Chief Complaint  Patient presents with  . Shortness of Breath     (Consider location/radiation/quality/duration/timing/severity/associated sxs/prior Treatment) Patient is a 64 y.o. female presenting with shortness of breath. The history is provided by the patient.  Shortness of Breath Severity:  Mild Onset quality:  Gradual Duration:  3 weeks Timing:  Constant Progression:  Unchanged Chronicity:  New Context: not URI   Relieved by:  Nothing Worsened by:  Nothing tried Associated symptoms: chest pain and cough (dry, nonproductive)   Associated symptoms: no abdominal pain, no fever, no rash, no vomiting and no wheezing     Past Medical History  Diagnosis Date  . Hypertension    Past Surgical History  Procedure Laterality Date  . Rotator cuff repair     History reviewed. No pertinent family history. Social History  Substance Use Topics  . Smoking status: Never Smoker   . Smokeless tobacco: None  . Alcohol Use: No   OB History    No data available     Review of Systems  Constitutional: Negative for fever.  Respiratory: Positive for cough (dry, nonproductive) and shortness of breath. Negative for wheezing.   Cardiovascular: Positive for chest pain.  Gastrointestinal: Negative for vomiting and abdominal pain.  Skin: Negative for rash.  All other systems reviewed and are negative.     Allergies  Review of patient's allergies indicates no known allergies.  Home Medications   Prior to Admission medications   Medication Sig Start Date End Date Taking? Authorizing Provider  aspirin EC 81 MG tablet Take 81 mg by mouth daily.    Historical Provider, MD  azithromycin (ZITHROMAX Z-PAK) 250 MG tablet 2 po day one, then 1 daily x 4 days 05/26/14   Margarita Mail, PA-C  carvedilol (COREG) 12.5 MG tablet Take 12.5 mg by mouth 2 (two) times daily with  a meal.    Historical Provider, MD  furosemide (LASIX) 20 MG tablet Take 1 tablet (20 mg total) by mouth daily. 04/20/14   Nicole Pisciotta, PA-C  guaiFENesin-codeine 100-10 MG/5ML syrup Take 5-10 mLs by mouth every 6 (six) hours as needed for cough. 05/26/14   Margarita Mail, PA-C  lisinopril (PRINIVIL,ZESTRIL) 10 MG tablet Take 10 mg by mouth at bedtime.    Historical Provider, MD  Menthol, Topical Analgesic, (BENGAY EX) Apply 1 application topically daily as needed (pain).    Historical Provider, MD  methocarbamol (ROBAXIN) 500 MG tablet Take 1 tablet (500 mg total) by mouth 2 (two) times daily. Patient not taking: Reported on 04/20/2014 04/11/12   Montine Circle, PA-C  naproxen sodium (ANAPROX) 220 MG tablet Take 440 mg by mouth daily as needed (knee pain).    Historical Provider, MD  OVER THE COUNTER MEDICATION Apply 1 application topically daily as needed (knee pain). OTC pain cream    Historical Provider, MD  predniSONE (DELTASONE) 20 MG tablet Take 2 tablets (40 mg total) by mouth daily. 05/26/14   Abigail Harris, PA-C   BP 157/81 mmHg  Pulse 95  Temp(Src) 98.6 F (37 C) (Oral)  Resp 20  SpO2 97% Physical Exam  Constitutional: She is oriented to person, place, and time. She appears well-developed and well-nourished. No distress.  HENT:  Head: Normocephalic and atraumatic.  Mouth/Throat: Oropharynx is clear and moist.  Eyes: EOM are normal. Pupils are equal, round, and reactive to light.  Neck: Normal range of motion. Neck supple.  Cardiovascular: Normal rate and regular rhythm.  Exam reveals no friction rub.   No murmur heard. Pulmonary/Chest: Effort normal and breath sounds normal. No respiratory distress. She has no wheezes. She has no rales.  Abdominal: Soft. She exhibits no distension. There is no tenderness. There is no rebound.  Musculoskeletal: Normal range of motion. She exhibits no edema.  Neurological: She is alert and oriented to person, place, and time. No cranial nerve  deficit. She exhibits normal muscle tone. Coordination normal.  Skin: No rash noted. She is not diaphoretic.  Nursing note and vitals reviewed.   ED Course  Procedures (including critical care time) Labs Review Labs Reviewed  CBC  BASIC METABOLIC PANEL  BRAIN NATRIURETIC PEPTIDE  D-DIMER, QUANTITATIVE (NOT AT [facility])  Randolm Idol, ED    Imaging Review Dg Chest 2 View  10/14/2014  CLINICAL DATA:  Cough and shortness of breath for 3 weeks EXAM: CHEST  2 VIEW COMPARISON:  May 26, 2014 FINDINGS: The heart size and mediastinal contours are stable. The aorta is tortuous. There is no focal infiltrate, pulmonary edema, or pleural effusion. The visualized skeletal structures are stable. IMPRESSION: No active cardiopulmonary disease. Electronically Signed   By: Abelardo Diesel M.D.   On: 10/14/2014 15:14   I have personally reviewed and evaluated these images and lab results as part of my medical decision-making.   EKG Interpretation   Date/Time:  Thursday October 14 2014 14:20:21 EDT Ventricular Rate:  97 PR Interval:  144 QRS Duration: 114 QT Interval:  382 QTC Calculation: 485 R Axis:   -24 Text Interpretation:  Normal sinus rhythm Left ventricular hypertrophy ST  \T\ T wave abnormality, consider inferior ischemia Prolonged QT Abnormal  ECG No significant change since last tracing Confirmed by Mingo Amber  MD,  [place] ([id number]) on 10/14/2014 2:27:30 PM      MDM   Final diagnoses:  Bronchitis    8F here with SOB, cough, chest pain. SOB and cough for 3 weeks, no fevers, no productivity to cough. L sided, non-radiating chest pain. Not sharp, describes as "hurting." Lungs clear. Legs without edema, no JVD, no signs of edema. Will give a breathing treatment. Will check labs, CXR, d-dimer.  D-dimer elevated. Will obtain PE study.   BNP 512, no signs of volume overload on exam, she is already on CHF meds, will have her f/u with Cards.  Dr. Sabra Heck to f/u on PE scan.  Evelina Bucy,  MD 10/14/14 [phone]

## 2014-12-03 ENCOUNTER — Inpatient Hospital Stay (HOSPITAL_COMMUNITY)
Admission: EM | Admit: 2014-12-03 | Discharge: 2014-12-05 | DRG: 293 | Disposition: A | Discharge status: Home / Self Care | Payer: Medicare Other | Attending: Internal Medicine | Admitting: Internal Medicine

## 2014-12-03 ENCOUNTER — Emergency Department (HOSPITAL_COMMUNITY): Payer: Medicare Other

## 2014-12-03 ENCOUNTER — Encounter (HOSPITAL_COMMUNITY): Payer: Self-pay | Admitting: Family Medicine

## 2014-12-03 DIAGNOSIS — I10 Essential (primary) hypertension: Secondary | ICD-10-CM | POA: Diagnosis not present

## 2014-12-03 DIAGNOSIS — Z23 Encounter for immunization: Secondary | ICD-10-CM

## 2014-12-03 DIAGNOSIS — D638 Anemia in other chronic diseases classified elsewhere: Secondary | ICD-10-CM | POA: Diagnosis present

## 2014-12-03 DIAGNOSIS — E876 Hypokalemia: Secondary | ICD-10-CM | POA: Diagnosis present

## 2014-12-03 DIAGNOSIS — I110 Hypertensive heart disease with heart failure: Secondary | ICD-10-CM | POA: Diagnosis not present

## 2014-12-03 DIAGNOSIS — I5031 Acute diastolic (congestive) heart failure: Secondary | ICD-10-CM | POA: Diagnosis not present

## 2014-12-03 DIAGNOSIS — I5043 Acute on chronic combined systolic (congestive) and diastolic (congestive) heart failure: Secondary | ICD-10-CM | POA: Diagnosis present

## 2014-12-03 DIAGNOSIS — Z7952 Long term (current) use of systemic steroids: Secondary | ICD-10-CM

## 2014-12-03 DIAGNOSIS — D649 Anemia, unspecified: Secondary | ICD-10-CM | POA: Diagnosis present

## 2014-12-03 DIAGNOSIS — Z79899 Other long term (current) drug therapy: Secondary | ICD-10-CM

## 2014-12-03 DIAGNOSIS — Z9114 Patient's other noncompliance with medication regimen: Secondary | ICD-10-CM

## 2014-12-03 DIAGNOSIS — T502X5A Adverse effect of carbonic-anhydrase inhibitors, benzothiadiazides and other diuretics, initial encounter: Secondary | ICD-10-CM | POA: Diagnosis present

## 2014-12-03 DIAGNOSIS — Z7982 Long term (current) use of aspirin: Secondary | ICD-10-CM

## 2014-12-03 DIAGNOSIS — Z7722 Contact with and (suspected) exposure to environmental tobacco smoke (acute) (chronic): Secondary | ICD-10-CM | POA: Diagnosis present

## 2014-12-03 DIAGNOSIS — I509 Heart failure, unspecified: Secondary | ICD-10-CM

## 2014-12-03 HISTORY — DX: Heart failure, unspecified: I50.9

## 2014-12-03 LAB — BRAIN NATRIURETIC PEPTIDE: B Natriuretic Peptide: 884 pg/mL — ABNORMAL HIGH (ref 0.0–100.0)

## 2014-12-03 LAB — I-STAT TROPONIN, ED: Troponin i, poc: 0.01 ng/mL (ref 0.00–0.08)

## 2014-12-03 LAB — BASIC METABOLIC PANEL
Anion gap: 7 (ref 5–15)
BUN: 16 mg/dL (ref 6–20)
CO2: 26 mmol/L (ref 22–32)
Calcium: 9.2 mg/dL (ref 8.9–10.3)
Chloride: 110 mmol/L (ref 101–111)
Creatinine, Ser: 1.05 mg/dL — ABNORMAL HIGH (ref 0.44–1.00)
GFR calc Af Amer: >60 mL/min (ref >60)
GFR calc non Af Amer: 55 mL/min — ABNORMAL LOW (ref >60)
Glucose, Bld: 109 mg/dL — ABNORMAL HIGH (ref 65–99)
Potassium: 4.1 mmol/L (ref 3.5–5.1)
Sodium: 143 mmol/L (ref 135–145)

## 2014-12-03 LAB — CBC
HCT: 32 % — ABNORMAL LOW (ref 36.0–46.0)
Hemoglobin: 10.1 g/dL — ABNORMAL LOW (ref 12.0–15.0)
MCH: 27.6 pg (ref 26.0–34.0)
MCHC: 31.6 g/dL (ref 30.0–36.0)
MCV: 87.4 fL (ref 78.0–100.0)
Platelets: 244 10*3/uL (ref 150–400)
RBC: 3.66 MIL/uL — ABNORMAL LOW (ref 3.87–5.11)
RDW: 14 % (ref 11.5–15.5)
WBC: 5.9 10*3/uL (ref 4.0–10.5)

## 2014-12-03 MED ORDER — ONDANSETRON HCL 4 MG/2ML IJ SOLN
4.0000 mg | Freq: Four times a day (QID) | INTRAMUSCULAR | Status: DC | PRN
Start: 2014-12-03 — End: 2014-12-05

## 2014-12-03 MED ORDER — FUROSEMIDE 10 MG/ML IJ SOLN: 40.0000 mg | Freq: Every day | INTRAMUSCULAR | Status: DC

## 2014-12-03 MED ORDER — FUROSEMIDE 10 MG/ML IJ SOLN
60.0000 mg | Freq: Once | INTRAMUSCULAR | Status: AC
  Administered 2014-12-03: 60 mg via INTRAVENOUS
  Filled 2014-12-03: qty 6

## 2014-12-03 MED ORDER — ASPIRIN EC 81 MG PO TBEC
81.0000 mg | DELAYED_RELEASE_TABLET | Freq: Every day | ORAL | Status: DC
  Administered 2014-12-04 – 2014-12-05 (×2): 81 mg via ORAL
  Filled 2014-12-03 (×2): qty 1

## 2014-12-03 MED ORDER — LISINOPRIL 2.5 MG PO TABS: 2.5000 mg | ORAL_TABLET | Freq: Every day | ORAL | Status: DC

## 2014-12-03 MED ORDER — SODIUM CHLORIDE 0.9 % IJ SOLN: 3.0000 mL | INTRAMUSCULAR | Status: DC | PRN

## 2014-12-03 MED ORDER — ACETAMINOPHEN 325 MG PO TABS
650.0000 mg | ORAL_TABLET | ORAL | Status: DC | PRN
  Administered 2014-12-04: 650 mg via ORAL

## 2014-12-03 MED ORDER — CARVEDILOL 12.5 MG PO TABS: 12.5000 mg | ORAL_TABLET | Freq: Two times a day (BID) | ORAL | Status: DC

## 2014-12-03 MED ORDER — SODIUM CHLORIDE 0.9 % IV SOLN
250.0000 mL | INTRAVENOUS | Status: DC | PRN
Start: 2014-12-03 — End: 2014-12-05

## 2014-12-03 MED ORDER — ALBUTEROL SULFATE (2.5 MG/3ML) 0.083% IN NEBU: 2.5000 mg | INHALATION_SOLUTION | RESPIRATORY_TRACT | Status: DC | PRN

## 2014-12-03 MED ORDER — ENOXAPARIN SODIUM 40 MG/0.4ML ~~LOC~~ SOLN
40.0000 mg | Freq: Every day | SUBCUTANEOUS | Status: DC
  Administered 2014-12-04 (×2): 40 mg via SUBCUTANEOUS
  Filled 2014-12-03 (×2): qty 0.4

## 2014-12-03 MED ORDER — SODIUM CHLORIDE 0.9 % IJ SOLN
3.0000 mL | Freq: Two times a day (BID) | INTRAMUSCULAR | Status: DC
  Administered 2014-12-04 – 2014-12-05 (×4): 3 mL via INTRAVENOUS

## 2014-12-03 MED ORDER — IPRATROPIUM BROMIDE 0.02 % IN SOLN: 0.5000 mg | Freq: Four times a day (QID) | RESPIRATORY_TRACT | Status: DC

## 2014-12-03 NOTE — ED Provider Notes (Signed)
CSN: [id number]     Arrival date & time 12/03/14  1845 History   First MD Initiated Contact with Patient 12/03/14 1949     Chief Complaint  Patient presents with  . Shortness of Breath  . Cough  . Wheezing     (Consider location/radiation/quality/duration/timing/severity/associated sxs/prior Treatment) HPI Comments: Patient here complaining of one month of worsening dyspnea which is worse with ambulation as well as lying flat. History of CHF and this feels similar. Has not seen her provider for this. States that she has been compliant with her medications. Denies any anginal type chest pain. Has had nonproductive cough without fever or chills. Today things became worse when she went to [facility]. Denies any syncope or near syncope. No palpitations noted. No treatment for this used prior to arrival  Patient is a 65 y.o. female presenting with shortness of breath, cough, and wheezing. The history is provided by the patient and a relative.  Shortness of Breath Associated symptoms: cough and wheezing   Cough Associated symptoms: shortness of breath and wheezing   Wheezing Associated symptoms: cough and shortness of breath     Past Medical History  Diagnosis Date  . Hypertension   . CHF (congestive heart failure) [facility])    Past Surgical History  Procedure Laterality Date  . Rotator cuff repair     History reviewed. No pertinent family history. Social History  Substance Use Topics  . Smoking status: Passive Smoke Exposure - Never Smoker    Types: Cigarettes  . Smokeless tobacco: None  . Alcohol Use: No   OB History    No data available     Review of Systems  Respiratory: Positive for cough, shortness of breath and wheezing.   All other systems reviewed and are negative.     Allergies  Review of patient's allergies indicates no known allergies.  Home Medications   Prior to Admission medications   Medication Sig Start Date End Date Taking? Authorizing Provider  aspirin  EC 81 MG tablet Take 81 mg by mouth daily.    Historical Provider, MD  azithromycin (ZITHROMAX Z-PAK) 250 MG tablet 2 po day one, then 1 daily x 4 days 05/26/14   Margarita Mail, PA-C  azithromycin (ZITHROMAX) 250 MG tablet Take 1 tablet (250 mg total) by mouth daily. Take first 2 tablets together, then 1 every day until finished. 10/14/14   Evelina Bucy, MD  carvedilol (COREG) 12.5 MG tablet Take 12.5 mg by mouth 2 (two) times daily with a meal.    Historical Provider, MD  furosemide (LASIX) 20 MG tablet Take 1 tablet (20 mg total) by mouth daily. 04/20/14   Nicole Pisciotta, PA-C  guaiFENesin-codeine 100-10 MG/5ML syrup Take 5-10 mLs by mouth every 6 (six) hours as needed for cough. 05/26/14   Margarita Mail, PA-C  lisinopril (PRINIVIL,ZESTRIL) 10 MG tablet Take 10 mg by mouth at bedtime.    Historical Provider, MD  Menthol, Topical Analgesic, (BENGAY EX) Apply 1 application topically daily as needed (pain).    Historical Provider, MD  methocarbamol (ROBAXIN) 500 MG tablet Take 1 tablet (500 mg total) by mouth 2 (two) times daily. Patient not taking: Reported on 04/20/2014 04/11/12   Montine Circle, PA-C  naproxen sodium (ANAPROX) 220 MG tablet Take 440 mg by mouth daily as needed (knee pain).    Historical Provider, MD  OVER THE COUNTER MEDICATION Apply 1 application topically daily as needed (knee pain). OTC pain cream    Historical Provider, MD  predniSONE (DELTASONE) 20  MG tablet Take 2 tablets (40 mg total) by mouth daily. 05/26/14   Abigail Harris, PA-C   BP 147/79 mmHg  Pulse 95  Temp(Src) 97.9 F (36.6 C) (Oral)  Resp 18  Wt 119.466 kg  SpO2 97% Physical Exam  Constitutional: She is oriented to person, place, and time. She appears well-developed and well-nourished.  Non-toxic appearance. No distress.  HENT:  Head: Normocephalic and atraumatic.  Eyes: Conjunctivae, EOM and lids are normal. Pupils are equal, round, and reactive to light.  Neck: Normal range of motion. Neck supple. No  tracheal deviation present. No thyroid mass present.  Cardiovascular: Normal rate, regular rhythm and normal heart sounds.  Exam reveals no gallop.   No murmur heard. Pulmonary/Chest: Effort normal. No stridor. No respiratory distress. She has decreased breath sounds. She has no wheezes. She has rhonchi. She has no rales.  Abdominal: Soft. Normal appearance and bowel sounds are normal. She exhibits no distension. There is no tenderness. There is no rebound and no CVA tenderness.  Musculoskeletal: Normal range of motion. She exhibits no edema or tenderness.  Neurological: She is alert and oriented to person, place, and time. She has normal strength. No cranial nerve deficit or sensory deficit. GCS eye subscore is 4. GCS verbal subscore is 5. GCS motor subscore is 6.  Skin: Skin is warm and dry. No abrasion and no rash noted.  Psychiatric: She has a normal mood and affect. Her speech is normal and behavior is normal.  Nursing note and vitals reviewed.   ED Course  Procedures (including critical care time) Labs Review Labs Reviewed  BASIC METABOLIC PANEL - Abnormal; Notable for the following:    Glucose, Bld 109 (*)    Creatinine, Ser 1.05 (*)    GFR calc non Af Amer 55 (*)    All other components within normal limits  CBC - Abnormal; Notable for the following:    RBC 3.66 (*)    Hemoglobin 10.1 (*)    HCT 32.0 (*)    All other components within normal limits  BRAIN NATRIURETIC PEPTIDE - Abnormal; Notable for the following:    B Natriuretic Peptide 884.0 (*)    All other components within normal limits  I-STAT TROPOININ, ED    Imaging Review No results found. I have personally reviewed and evaluated these images and lab results as part of my medical decision-making.   EKG Interpretation   Date/Time:  Friday December 03 2014 18:54:09 EST Ventricular Rate:  102 PR Interval:  142 QRS Duration: 90 QT Interval:  376 QTC Calculation: 490 R Axis:   31 Text Interpretation:  Sinus  tachycardia Left ventricular hypertrophy with  repolarization abnormality Abnormal ECG No significant change since last  tracing Confirmed by Isayah Ignasiak  MD, Jauan Wohl ([id number]) on 12/03/2014 7:51:49 PM      MDM   Final diagnoses:  None   patient elevated BNP here and symptoms consistent with CHF. Was given Lasix 60 mg IV push and will be admitted to the hospitalist service    Lacretia Leigh, MD 12/03/14 2206

## 2014-12-03 NOTE — ED Notes (Addendum)
Pt here for SOB cough, wheezing x month and half. sts hx of CHF.

## 2014-12-03 NOTE — H&P (Addendum)
PCP: Philis Fendt, MD    Referring provider Zenia Resides   Chief Complaint:  Shortness of breath and orthopnea  HPI: Deborah Holmes is a 64 y.o. female   has a past medical history of Hypertension and CHF (congestive heart failure) ([place]).   Presented with  Shortness of breath cough and some wheezing for past one month has been getting worse progressively. Patient states she has history of heart failure. No echo gram in the system. Patient has not had any chest pain. No fevers no chills. Cough is nonproductive. Today symptoms have had worsening shortness of breath at her baseline and she had some activity while walking for [employer]. She does endorse worsening shortness of breath with laying flat. No lower extremity swelling but has not been checking. BNP 884 states she is being followed by cardiology at [facility] and been told that she has heart failure. Patient is on Lasix but states that he has not been taking it because it makes her urinate She has history of chronic anemia with baseline hemoglobin around 10 denies any black stools no blood in stools no vaginal bleeding and hemoglobin stable.  This x-ray today showing stable cardiomegaly no pulmonary edema no acute pulmonary disease. Patient required 2 L of oxygen. In emergency from she was given Lasix 60 mg IV and started to [place] was called for admission for CHF exacerbation  Review of Systems:    Pertinent positives include: shortness of breath at rest. dyspnea on exertion,  Constitutional:  No weight loss, night sweats, Fevers, chills, fatigue, weight loss  HEENT:  No headaches, Difficulty swallowing,Tooth/dental problems,Sore throat,  No sneezing, itching, ear ache, nasal congestion, post nasal drip,  Cardio-vascular:  No chest pain, Orthopnea, PND, anasarca, dizziness, palpitations.no Bilateral lower extremity swelling  GI:  No heartburn, indigestion, abdominal pain, nausea, vomiting, diarrhea, change in  bowel habits, loss of appetite, melena, blood in stool, hematemesis Resp:  no  No  No excess mucus, no productive cough, No non-productive cough, No coughing up of blood.No change in color of mucus.No wheezing. Skin:  no rash or lesions. No jaundice GU:  no dysuria, change in color of urine, no urgency or frequency. No straining to urinate.  No flank pain.  Musculoskeletal:  No joint pain or no joint swelling. No decreased range of motion. No back pain.  Psych:  No change in mood or affect. No depression or anxiety. No memory loss.  Neuro: no localizing neurological complaints, no tingling, no weakness, no double vision, no gait abnormality, no slurred speech, no confusion  Otherwise ROS are negative except for above, 10 systems were reviewed  Past Medical History: Past Medical History  Diagnosis Date  . Hypertension   . CHF (congestive heart failure) [facility])    Past Surgical History  Procedure Laterality Date  . Rotator cuff repair       Medications: Prior to Admission medications   Medication Sig Start Date End Date Taking? Authorizing Provider  aspirin EC 81 MG tablet Take 81 mg by mouth daily.   Yes Historical Provider, MD  carvedilol (COREG) 12.5 MG tablet Take 12.5 mg by mouth 2 (two) times daily with a meal.   Yes Historical Provider, MD  furosemide (LASIX) 20 MG tablet Take 1 tablet (20 mg total) by mouth daily. 04/20/14  Yes Nicole Pisciotta, PA-C  Menthol, Topical Analgesic, (BENGAY EX) Apply 1 application topically daily as needed (pain).   Yes Historical Provider, MD  naproxen sodium (ANAPROX) 220 MG tablet  Take 440 mg by mouth daily as needed (knee pain).   Yes Historical Provider, MD  OVER THE COUNTER MEDICATION Apply 1 application topically daily as needed (knee pain). OTC pain cream   Yes Historical Provider, MD  predniSONE (DELTASONE) 20 MG tablet Take 2 tablets (40 mg total) by mouth daily. 05/26/14  Yes Margarita Mail, PA-C  azithromycin (ZITHROMAX Z-PAK) 250 MG  tablet 2 po day one, then 1 daily x 4 days Patient not taking: Reported on 12/03/2014 05/26/14   Margarita Mail, PA-C  azithromycin (ZITHROMAX) 250 MG tablet Take 1 tablet (250 mg total) by mouth daily. Take first 2 tablets together, then 1 every day until finished. Patient not taking: Reported on 12/03/2014 10/14/14   Evelina Bucy, MD  guaiFENesin-codeine 100-10 MG/5ML syrup Take 5-10 mLs by mouth every 6 (six) hours as needed for cough. Patient not taking: Reported on 12/03/2014 05/26/14   Margarita Mail, PA-C  methocarbamol (ROBAXIN) 500 MG tablet Take 1 tablet (500 mg total) by mouth 2 (two) times daily. Patient not taking: Reported on 04/20/2014 04/11/12   Montine Circle, PA-C    Allergies:  No Known Allergies  Social History:  Ambulatory independently   Lives at home   With family     reports that she has been passively smoking Cigarettes.  She does not have any smokeless tobacco history on file. She reports that she does not drink alcohol or use illicit drugs.    Family History: family history includes CAD in her father and mother; Diabetes type II in her father; Sickle cell anemia in her mother.    Physical Exam: Patient Vitals for the past 24 hrs:  BP Temp Temp src Pulse Resp SpO2 Weight  12/03/14 2059 135/80 mmHg - - 94 24 98 % -  12/03/14 1854 147/79 mmHg 97.9 F (36.6 C) Oral 95 18 97 % 119.466 kg (263 lb 6 oz)    1. General:  in No Acute distress 2. Psychological: Alert and  Oriented 3. Head/ENT:   Moist   Mucous Membranes                          Head Non traumatic, neck supple                          Normal  Dentition 4. SKIN:   decreased Skin turgor,  Skin clean Dry and intact no rash 5. Heart: Regular rate and rhythm no Murmur, Rub or gallop 6. Lungs no wheezes mild crackles   7. Abdomen: Soft, non-tender, Non distended 8. Lower extremities: no clubbing, cyanosis, or edema 9. Neurologically Grossly intact, moving all 4 extremities equally 10. MSK: Normal range  of motion  body mass index is unknown because there is no height on file.   Labs on Admission:   Results for orders placed or performed during the hospital encounter of 12/03/14 (from the past 24 hour(s))  I-stat troponin, ED (not at [facility])     Status: None   Collection Time: 12/03/14  7:18 PM  Result Value Ref Range   Troponin i, poc 0.01 0.00 - 0.08 ng/mL   Comment 3          Basic metabolic panel     Status: Abnormal   Collection Time: 12/03/14  7:19 PM  Result Value Ref Range   Sodium 143 135 - 145 mmol/L   Potassium 4.1 3.5 - 5.1 mmol/L   Chloride  110 101 - 111 mmol/L   CO2 26 22 - 32 mmol/L   Glucose, Bld 109 (H) 65 - 99 mg/dL   BUN 16 6 - 20 mg/dL   Creatinine, Ser 1.05 (H) 0.44 - 1.00 mg/dL   Calcium 9.2 8.9 - 10.3 mg/dL   GFR calc non Af Amer 55 (L) >60 mL/min   GFR calc Af Amer >60 >60 mL/min   Anion gap 7 5 - 15  CBC     Status: Abnormal   Collection Time: 12/03/14  7:19 PM  Result Value Ref Range   WBC 5.9 4.0 - 10.5 K/uL   RBC 3.66 (L) 3.87 - 5.11 MIL/uL   Hemoglobin 10.1 (L) 12.0 - 15.0 g/dL   HCT 32.0 (L) 36.0 - 46.0 %   MCV 87.4 78.0 - 100.0 fL   MCH 27.6 26.0 - 34.0 pg   MCHC 31.6 30.0 - 36.0 g/dL   RDW 14.0 11.5 - 15.5 %   Platelets 244 150 - 400 K/uL  Brain natriuretic peptide     Status: Abnormal   Collection Time: 12/03/14  7:19 PM  Result Value Ref Range   B Natriuretic Peptide 884.0 (H) 0.0 - 100.0 pg/mL    UA not obtained will order  No results found for: HGBA1C  CrCl cannot be calculated (Unknown ideal weight.).  BNP (last 3 results) No results for input(s): PROBNP in the last 8760 hours.  Other results:  I have pearsonaly reviewed this: ECG REPORT  Rate: 102  Rhythm: ST ST&T Change: T wave inversion in V3-V6 and ST depression in II and III QTC 490  Filed Weights   12/03/14 1854  Weight: 119.466 kg (263 lb 6 oz)     Cultures: No results found for: SDES, SPECREQUEST, CULT, REPTSTATUS   Radiological Exams on  Admission: Dg Chest 2 View  12/03/2014  CLINICAL DATA:  Shortness of breath EXAM: CHEST  2 VIEW COMPARISON:  10/14/1958 chest radiograph. FINDINGS: Stable cardiomediastinal silhouette with mild cardiomegaly. No pneumothorax. No pleural effusion. No overt pulmonary edema. No focal lung opacity. Surgical clips are noted in the right upper abdomen. IMPRESSION: Stable mild cardiomegaly without overt pulmonary edema. No active pulmonary disease. Electronically Signed   By: Ilona Sorrel M.D.   On: 12/03/2014 20:54    Chart has been reviewed  Family   at  Bedside  plan of care was discussed with Daughter   Assessment/Plan  64 year old female with history of hypertension and heart failure presents with worsening shortness of breath  Present on Admission:  Dyspnea multifactorial possibly mild CHF exacerbation versus bronchitis. Given past history of CHF and poor medical compliance will observe overnight. Continues with diuresis and monitor creatinine and urine output. Cycle cardiac enzymes will obtain echogram. Make sure patient is on ACE inhibitor.  Given possible bronchitis contributing to his symptoms provided history of coughing and exposure to tobacco smoke with occasional episodic wheezing we'll give albuterol as needed and Atrovent to see if that helps  . Anemia - chronic unchanged obtain anemia panel to see if patient is to be on iron supplementation  . Hypertension chronic continue Coreg and add ACE inhibitor   Prophylaxis:  Lovenox   CODE STATUS:  FULL CODE as per patient    Disposition:   To home once workup is complete and patient is stable expected discharge  in the morning  Other plan as per orders.  I have spent a total of 55 min on this admission  Khalifa Knecht 12/03/2014, 10:23 PM  [facility]  Pager [phone]   after 2 AM please page floor coverage PA If 7AM-7PM, please contact the day team taking care of the patient  [email or web address]  Password [email or web address]

## 2014-12-03 NOTE — ED Notes (Signed)
Admitting MD at bedside.

## 2014-12-04 ENCOUNTER — Other Ambulatory Visit (HOSPITAL_COMMUNITY): Payer: Medicare Other

## 2014-12-04 DIAGNOSIS — I509 Heart failure, unspecified: Secondary | ICD-10-CM | POA: Diagnosis not present

## 2014-12-04 DIAGNOSIS — I5031 Acute diastolic (congestive) heart failure: Secondary | ICD-10-CM | POA: Diagnosis present

## 2014-12-04 DIAGNOSIS — Z7722 Contact with and (suspected) exposure to environmental tobacco smoke (acute) (chronic): Secondary | ICD-10-CM | POA: Diagnosis present

## 2014-12-04 DIAGNOSIS — Z23 Encounter for immunization: Secondary | ICD-10-CM | POA: Diagnosis not present

## 2014-12-04 DIAGNOSIS — I10 Essential (primary) hypertension: Secondary | ICD-10-CM

## 2014-12-04 DIAGNOSIS — I110 Hypertensive heart disease with heart failure: Secondary | ICD-10-CM | POA: Diagnosis present

## 2014-12-04 DIAGNOSIS — E876 Hypokalemia: Secondary | ICD-10-CM | POA: Diagnosis present

## 2014-12-04 DIAGNOSIS — I5043 Acute on chronic combined systolic (congestive) and diastolic (congestive) heart failure: Secondary | ICD-10-CM | POA: Diagnosis present

## 2014-12-04 DIAGNOSIS — Z79899 Other long term (current) drug therapy: Secondary | ICD-10-CM | POA: Diagnosis not present

## 2014-12-04 DIAGNOSIS — Z7982 Long term (current) use of aspirin: Secondary | ICD-10-CM | POA: Diagnosis not present

## 2014-12-04 DIAGNOSIS — Z7952 Long term (current) use of systemic steroids: Secondary | ICD-10-CM | POA: Diagnosis not present

## 2014-12-04 DIAGNOSIS — T502X5A Adverse effect of carbonic-anhydrase inhibitors, benzothiadiazides and other diuretics, initial encounter: Secondary | ICD-10-CM | POA: Diagnosis present

## 2014-12-04 DIAGNOSIS — D638 Anemia in other chronic diseases classified elsewhere: Secondary | ICD-10-CM

## 2014-12-04 DIAGNOSIS — Z9114 Patient's other noncompliance with medication regimen: Secondary | ICD-10-CM | POA: Diagnosis not present

## 2014-12-04 LAB — RETICULOCYTES
RBC.: 4 MIL/uL (ref 3.87–5.11)
Retic Count, Absolute: 64 10*3/uL (ref 19.0–186.0)
Retic Ct Pct: 1.6 % (ref 0.4–3.1)

## 2014-12-04 LAB — URINALYSIS, ROUTINE W REFLEX MICROSCOPIC
Bilirubin Urine: NEGATIVE
Glucose, UA: NEGATIVE mg/dL
Hgb urine dipstick: NEGATIVE
Ketones, ur: NEGATIVE mg/dL
Leukocytes, UA: NEGATIVE
Nitrite: NEGATIVE
Protein, ur: NEGATIVE mg/dL
Specific Gravity, Urine: 1.005 (ref 1.005–1.030)
pH: 7 (ref 5.0–8.0)

## 2014-12-04 LAB — BASIC METABOLIC PANEL
Anion gap: 8 (ref 5–15)
BUN: 12 mg/dL (ref 6–20)
CO2: 29 mmol/L (ref 22–32)
Calcium: 9.4 mg/dL (ref 8.9–10.3)
Chloride: 108 mmol/L (ref 101–111)
Creatinine, Ser: 0.86 mg/dL (ref 0.44–1.00)
GFR calc Af Amer: >60 mL/min (ref >60)
GFR calc non Af Amer: >60 mL/min (ref >60)
Glucose, Bld: 102 mg/dL — ABNORMAL HIGH (ref 65–99)
Potassium: 3.4 mmol/L — ABNORMAL LOW (ref 3.5–5.1)
Sodium: 145 mmol/L (ref 135–145)

## 2014-12-04 LAB — TROPONIN I
Troponin I: <0.03 ng/mL (ref <0.031)
Troponin I: <0.03 ng/mL (ref <0.031)
Troponin I: <0.03 ng/mL (ref <0.031)

## 2014-12-04 LAB — CBC
HCT: 34.3 % — ABNORMAL LOW (ref 36.0–46.0)
Hemoglobin: 10.9 g/dL — ABNORMAL LOW (ref 12.0–15.0)
MCH: 27.7 pg (ref 26.0–34.0)
MCHC: 31.8 g/dL (ref 30.0–36.0)
MCV: 87.3 fL (ref 78.0–100.0)
Platelets: 272 10*3/uL (ref 150–400)
RBC: 3.93 MIL/uL (ref 3.87–5.11)
RDW: 14.1 % (ref 11.5–15.5)
WBC: 5.8 10*3/uL (ref 4.0–10.5)

## 2014-12-04 LAB — IRON AND TIBC
Iron: 40 ug/dL (ref 28–170)
Saturation Ratios: 10 % — ABNORMAL LOW (ref 10.4–31.8)
TIBC: 398 ug/dL (ref 250–450)
UIBC: 358 ug/dL

## 2014-12-04 LAB — MAGNESIUM: Magnesium: 1.9 mg/dL (ref 1.7–2.4)

## 2014-12-04 LAB — CREATININE, SERUM
Creatinine, Ser: 0.93 mg/dL (ref 0.44–1.00)
GFR calc Af Amer: >60 mL/min (ref >60)
GFR calc non Af Amer: >60 mL/min (ref >60)

## 2014-12-04 LAB — TSH: TSH: 1.665 u[IU]/mL (ref 0.350–4.500)

## 2014-12-04 LAB — VITAMIN B12: Vitamin B-12: 199 pg/mL (ref 180–914)

## 2014-12-04 LAB — FERRITIN: Ferritin: 81 ng/mL (ref 11–307)

## 2014-12-04 LAB — FOLATE: Folate: 12.2 ng/mL (ref >5.9)

## 2014-12-04 MED ORDER — IPRATROPIUM BROMIDE 0.02 % IN SOLN: 0.5000 mg | RESPIRATORY_TRACT | Status: DC | PRN

## 2014-12-04 MED ORDER — FUROSEMIDE 10 MG/ML IJ SOLN
40.0000 mg | Freq: Two times a day (BID) | INTRAMUSCULAR | Status: DC
  Administered 2014-12-04 (×2): 40 mg via INTRAVENOUS
  Filled 2014-12-04 (×2): qty 4

## 2014-12-04 MED ORDER — LOSARTAN POTASSIUM 25 MG PO TABS
25.0000 mg | ORAL_TABLET | Freq: Every day | ORAL | Status: DC
  Administered 2014-12-04 – 2014-12-05 (×2): 25 mg via ORAL
  Filled 2014-12-04 (×2): qty 1

## 2014-12-04 MED ORDER — VITAMIN B-12 1000 MCG PO TABS
1000.0000 ug | ORAL_TABLET | Freq: Every day | ORAL | Status: DC
  Administered 2014-12-04 – 2014-12-05 (×2): 1000 ug via ORAL
  Filled 2014-12-04 (×2): qty 1

## 2014-12-04 MED ORDER — INFLUENZA VAC SPLIT QUAD 0.5 ML IM SUSY
0.5000 mL | PREFILLED_SYRINGE | INTRAMUSCULAR | Status: AC
  Administered 2014-12-05: 0.5 mL via INTRAMUSCULAR
  Filled 2014-12-04: qty 0.5

## 2014-12-04 MED ORDER — PNEUMOCOCCAL VAC POLYVALENT 25 MCG/0.5ML IJ INJ
0.5000 mL | INJECTION | INTRAMUSCULAR | Status: AC
  Administered 2014-12-05: 0.5 mL via INTRAMUSCULAR
  Filled 2014-12-04: qty 0.5

## 2014-12-04 MED ORDER — CARVEDILOL 12.5 MG PO TABS
12.5000 mg | ORAL_TABLET | Freq: Two times a day (BID) | ORAL | Status: DC
  Administered 2014-12-04 – 2014-12-05 (×3): 12.5 mg via ORAL
  Filled 2014-12-04 (×3): qty 1

## 2014-12-04 NOTE — Progress Notes (Addendum)
[facility] PROGRESS NOTE  Concettina Krupnick [facility] [id number] DOB: [birth year] DOA: 12/03/2014 PCP: Philis Fendt, MD  Brief Summary  Deborah Holmes is a 64 y.o. female with Hypertension and CHF (congestive heart failure) ([place]) who presented with shortness of breath, orthopnea, but no LEE.  CXR in emergency department demonstrated no overt pulmonary edema.  She was given lasix in the ER.    Assessment/Plan  Acute on chronic systolic heart failure with ejection fraction of 30-35%.  Reviewed records from [facility] and determined that she is on carvedilol 12.5 mg twice a day, losartan 25 mg once daily, Lasix 20 mg once daily. -  Dry weight is approximately 245 pounds -  -2.69 L -  Continue Lasix 40 mg IV twice a day -  Discontinue lisinopril and resume losartan which is a home medication -  ECHO -  troponins negative  Hypertension, blood pressure stable -  Continue carvedilol and start losartan  Anemia of chronic disease, however vitamin B12 is borderline low -  Start oral vitamin B12 supplementation -  Iron level and ferritin wnl, B12 199, folate 12.2 wnl -  TSH 1.665 wnl  Hypokalemia due to diuresis -  Start oral potassium supplementation  Diet:  Low sodium, 1.2 L fluid restriction Access:  PIV IVF:  Off Proph:  Lovenox  Code Status: Full code Family Communication: Patient alone Disposition Plan: Pending further improvement in dyspnea, must be able to complete basic ADLs without severe dyspnea prior to discharge. Current weight is 257 pounds, her dry weight is 245 pounds.   Consultants:  None  Procedures:  Echocardiogram pending  Antibiotics:  None   HPI/Subjective:  Patient states that she still feels somewhat Khadeem Rockett of breath at rest but markedly improved since coming into the hospital yesterday. She voided frequently overnight and feels that her abdomen is less swollen.  Objective: Filed Vitals:   12/03/14 2200 12/03/14 2339 12/04/14 0528 12/04/14 1230   BP: 139/85 156/96 139/98 117/68  Pulse: 93 98 95 80  Temp:  97.8 F (36.6 C) 98 F (36.7 C) 97.5 F (36.4 C)  TempSrc:  Oral Oral Oral  Resp: 22 18 19 18   Weight:  116.983 kg (257 lb 14.4 oz) 116.97 kg (257 lb 14 oz)   SpO2: 100% 96% 95% 91%    Intake/Output Summary (Last 24 hours) at 12/04/14 1424 Last data filed at 12/04/14 1257  Gross per 24 hour  Intake    538 ml  Output   3475 ml  Net  -2937 ml   Filed Weights   12/03/14 1854 12/03/14 2339 12/04/14 0528  Weight: 119.466 kg (263 lb 6 oz) 116.983 kg (257 lb 14.4 oz) 116.97 kg (257 lb 14 oz)   There is no height on file to calculate BMI.  Exam:   General:  Adult female, No acute distress  HEENT:  NCAT, MMM  Cardiovascular:  RRR, nl S1, S2 no mrg, 2+ pulses, warm extremities, telemetry: Sinus tachycardia that is trended down to the 90s  Respiratory:  Diminished at the bilateral bases, faint rales at the left base, no wheezes, rhonchi, no increased WOB at rest  Abdomen:   NABS, soft, mildly distended with some tenderness to palpation near the umbilical area without rebound or guarding  MSK:   Normal tone and bulk, no LEE  Neuro:  Grossly intact  Data Reviewed: Basic Metabolic Panel:  Recent Labs Lab 12/03/14 1919 12/04/14 0021 12/04/14 0532  NA 143  --  145  K 4.1  --  3.4*  CL 110  --  108  CO2 26  --  29  GLUCOSE 109*  --  102*  BUN 16  --  12  CREATININE 1.05* 0.93 0.86  CALCIUM 9.2  --  9.4  MG  --   --  1.9   Liver Function Tests: No results for input(s): AST, ALT, ALKPHOS, BILITOT, PROT, ALBUMIN in the last 168 hours. No results for input(s): LIPASE, AMYLASE in the last 168 hours. No results for input(s): AMMONIA in the last 168 hours. CBC:  Recent Labs Lab 12/03/14 1919 12/04/14 0021  WBC 5.9 5.8  HGB 10.1* 10.9*  HCT 32.0* 34.3*  MCV 87.4 87.3  PLT 244 272    No results found for this or any previous visit (from the past 240 hour(s)).   Studies: Dg Chest 2 View  12/03/2014   CLINICAL DATA:  Shortness of breath EXAM: CHEST  2 VIEW COMPARISON:  10/14/1958 chest radiograph. FINDINGS: Stable cardiomediastinal silhouette with mild cardiomegaly. No pneumothorax. No pleural effusion. No overt pulmonary edema. No focal lung opacity. Surgical clips are noted in the right upper abdomen. IMPRESSION: Stable mild cardiomegaly without overt pulmonary edema. No active pulmonary disease. Electronically Signed   By: Ilona Sorrel M.D.   On: 12/03/2014 20:54    Scheduled Meds: . aspirin EC  81 mg Oral Daily  . carvedilol  12.5 mg Oral BID WC  . enoxaparin (LOVENOX) injection  40 mg Subcutaneous QHS  . furosemide  40 mg Intravenous BID  . [START ON 12/05/2014] Influenza vac split quadrivalent PF  0.5 mL Intramuscular Tomorrow-1000  . losartan  25 mg Oral Daily  . [START ON 12/05/2014] pneumococcal 23 valent vaccine  0.5 mL Intramuscular Tomorrow-1000  . sodium chloride  3 mL Intravenous Q12H   Continuous Infusions:   Active Problems:   Anemia   CHF exacerbation ([place])   Hypertension    Time spent: 30 min    Ikey Omary, [facility] Pager [phone]. If 7PM-7AM, please contact night-coverage at [email or web address], password [facility] 12/04/2014, 2:24 PM

## 2014-12-05 ENCOUNTER — Inpatient Hospital Stay (HOSPITAL_COMMUNITY): Payer: Medicare Other

## 2014-12-05 ENCOUNTER — Other Ambulatory Visit (HOSPITAL_COMMUNITY): Payer: Medicare Other

## 2014-12-05 DIAGNOSIS — I509 Heart failure, unspecified: Secondary | ICD-10-CM

## 2014-12-05 DIAGNOSIS — I110 Hypertensive heart disease with heart failure: Secondary | ICD-10-CM | POA: Diagnosis not present

## 2014-12-05 LAB — BASIC METABOLIC PANEL WITH GFR
Anion gap: 11 (ref 5–15)
BUN: 22 mg/dL — ABNORMAL HIGH (ref 6–20)
CO2: 28 mmol/L (ref 22–32)
Calcium: 9.8 mg/dL (ref 8.9–10.3)
Chloride: 103 mmol/L (ref 101–111)
Creatinine, Ser: 1.03 mg/dL — ABNORMAL HIGH (ref 0.44–1.00)
GFR calc Af Amer: >60 mL/min
GFR calc non Af Amer: 57 mL/min — ABNORMAL LOW
Glucose, Bld: 120 mg/dL — ABNORMAL HIGH (ref 65–99)
Potassium: 3.7 mmol/L (ref 3.5–5.1)
Sodium: 142 mmol/L (ref 135–145)

## 2014-12-05 LAB — MAGNESIUM: Magnesium: 2.1 mg/dL (ref 1.7–2.4)

## 2014-12-05 MED ORDER — FUROSEMIDE 20 MG PO TABS: 20.0000 mg | ORAL_TABLET | Freq: Every day | ORAL | Status: DC

## 2014-12-05 MED ORDER — LOSARTAN POTASSIUM 25 MG PO TABS: 25.0000 mg | ORAL_TABLET | Freq: Every day | ORAL | Status: DC

## 2014-12-05 MED ORDER — CYANOCOBALAMIN 1000 MCG PO TABS: 1000.0000 ug | ORAL_TABLET | Freq: Every day | ORAL | Status: AC

## 2014-12-05 MED ORDER — FUROSEMIDE 20 MG PO TABS
20.0000 mg | ORAL_TABLET | Freq: Every day | ORAL | Status: DC
  Administered 2014-12-05: 20 mg via ORAL
  Filled 2014-12-05: qty 1

## 2014-12-05 MED ORDER — CARVEDILOL 12.5 MG PO TABS
12.5000 mg | ORAL_TABLET | Freq: Two times a day (BID) | ORAL | Status: DC
Start: 2014-12-05 — End: 2019-01-07

## 2014-12-05 NOTE — Discharge Summary (Signed)
Physician Discharge Summary  Deborah Holmes [facility] [id number] DOB: 1950-05-19 DOA: 12/03/2014  PCP: Philis Fendt, MD  Admit date: 12/03/2014 Discharge date: 12/05/2014  Recommendations for Outpatient Follow-up:  1. Discharge to home 2. Follow up with cardiologist at already scheduled appointment next week.  Repeat BMP at next visit 3. Anemia reevaluation in 1 month by PCP  Discharge Diagnoses:  Principal Problem:   Acute on chronic combined systolic and diastolic heart failure (HCC) Active Problems:   Anemia   CHF exacerbation ([place])   Hypertension   Discharge Condition: Stable, improved  Diet recommendation: Low-sodium  Wt Readings from Last 3 Encounters:  12/05/14 110.133 kg (242 lb 12.8 oz)    History of present illness:   The patient is a 64 year old female with history of hypertension and chronic systolic heart failure with ejection fraction of 30-35%, followed at [facility]. She presented with persistent progressive shortness of breath, orthopnea without lower extremity edema.  She had been noncompliant with her medications which is likely the cause for heart failure exacerbation. In the emergency department, her chest x-ray demonstrated cardiomegaly without pulmonary edema, however she appeared to be volume overloaded on exam and she was given 60 mg of IV Lasix in the emergency department which helped her feel better.  Hospital Course:   Acute on chronic systolic heart failure with previous ejection fraction of 30-35%.  Per records from [facility] this was thought to be due to hypertensive cardiomyopathy. Her echocardiogram here demonstrated severely reduced systolic function with ejection fraction of 25-30% with akinesis of the mid apical lateral septal and inferoseptal myocardium.  Grade 1 diastolic dysfunction.  She continued her beta blocker and ARB and was diuresed with Lasix 40 mg IV twice a day.  Overall for hospitalization she was -3.7 L and her weight at the time of discharge was  110 kg.  She was advised to resume her Lasix at the current dose and she will follow-up with her primary cardiologist in approximately 1 week for reevaluation. She was given instructions about 2000 mg sodium diet, daily weights, and medication compliance. She was advised to call her cardiologist if she gains more than 3 pounds in 1 day or 5 pounds from the time of discharge. Would recommend evaluation for spironolactone at her next visit. Given the wall motion abnormalities on her echocardiogram, would reevaluate the need for a stress test or heart catheterization if these focal wall motion abnormalities were not present at the time of her previous testing.  She denied any chest pain.    Hypertension, her blood pressure remained stable on carvedilol and losartan.  Anemia of chronic disease with borderline low vitamin B12 level of 199. Her iron and folate levels were within normal limits and her TSH was 1.665. Her hemoglobin remained stable in the 10 mg/dL range. She was started on oral vitamin B12 supplementation and should have additional testing by her primary care doctor at her next visit next month.  Hypokalemia secondary to diuretics. Resolved with oral potassium to limitation.  Consultants:  None  Procedures:  Echocardiogram  Antibiotics:  None  Discharge Exam: Filed Vitals:   12/05/14 0900 12/05/14 1200  BP: 127/74 121/70  Pulse: 79 89  Temp: 97.5 F (36.4 C) 97.9 F (36.6 C)  Resp: 18 18   Filed Vitals:   12/04/14 2016 12/05/14 0557 12/05/14 0900 12/05/14 1200  BP: 114/71 121/66 127/74 121/70  Pulse: 89 80 79 89  Temp: 97.8 F (36.6 C) 98 F (36.7 C) 97.5 F (36.4 C) 97.9  F (36.6 C)  TempSrc: Oral Oral Oral Oral  Resp: 18 16 18 18   Weight:  110.133 kg (242 lb 12.8 oz)    SpO2: 98% 99% 95% 100%     General: Adult female, No acute distress  Cardiovascular: RRR, nl S1, S2 no mrg, 2+ pulses, warm extremities, telemetry: Sinus rhythm 90s  Respiratory: CTAB, no  increased WOB  Abdomen: NABS, soft, NT/ND  MSK: Normal tone and bulk, no LEE  Neuro: Grossly intact  Discharge Instructions      Discharge Instructions    (HEART FAILURE PATIENTS) Call MD:  Anytime you have any of the following symptoms: 1) 3 pound weight gain in 24 hours or 5 pounds in 1 week 2) shortness of breath, with or without a dry hacking cough 3) swelling in the hands, feet or stomach 4) if you have to sleep on extra pillows at night in order to breathe.    Complete by:  As directed      Call MD for:  difficulty breathing, headache or visual disturbances    Complete by:  As directed      Call MD for:  extreme fatigue    Complete by:  As directed      Call MD for:  hives    Complete by:  As directed      Call MD for:  persistant dizziness or light-headedness    Complete by:  As directed      Call MD for:  persistant nausea and vomiting    Complete by:  As directed      Call MD for:  severe uncontrolled pain    Complete by:  As directed      Call MD for:  temperature >100.4    Complete by:  As directed      Diet - low sodium heart healthy    Complete by:  As directed      Increase activity slowly    Complete by:  As directed             Medication List    STOP taking these medications        azithromycin 250 MG tablet  Commonly known as:  ZITHROMAX     guaiFENesin-codeine 100-10 MG/5ML syrup     methocarbamol 500 MG tablet  Commonly known as:  ROBAXIN     naproxen sodium 220 MG tablet  Commonly known as:  ANAPROX     OVER THE COUNTER MEDICATION      TAKE these medications        aspirin EC 81 MG tablet  Take 81 mg by mouth daily.     BENGAY EX  Apply 1 application topically daily as needed (pain).     carvedilol 12.5 MG tablet  Commonly known as:  COREG  Take 1 tablet (12.5 mg total) by mouth 2 (two) times daily with a meal.     cyanocobalamin 1000 MCG tablet  Take 1 tablet (1,000 mcg total) by mouth daily.     furosemide 20 MG tablet   Commonly known as:  LASIX  Take 1 tablet (20 mg total) by mouth daily.     losartan 25 MG tablet  Commonly known as:  COZAAR  Take 1 tablet (25 mg total) by mouth daily.       Follow-up Information    Follow up with AVBUERE,EDWIN A, MD. Schedule an appointment as soon as possible for a visit in 1 month.   Specialty:  Internal Medicine  Contact information:   [address] [place] [address] [phone]       Follow up with Sol Passer, MD. Schedule an appointment as soon as possible for a visit in 8 days.   Specialty:  Cardiology   Contact information:   [address] [place]  [address] [phone]        The results of significant diagnostics from this hospitalization (including imaging, microbiology, ancillary and laboratory) are listed below for reference.    Significant Diagnostic Studies: Dg Chest 2 View  12/03/2014  CLINICAL DATA:  Shortness of breath EXAM: CHEST  2 VIEW COMPARISON:  10/14/1958 chest radiograph. FINDINGS: Stable cardiomediastinal silhouette with mild cardiomegaly. No pneumothorax. No pleural effusion. No overt pulmonary edema. No focal lung opacity. Surgical clips are noted in the right upper abdomen. IMPRESSION: Stable mild cardiomegaly without overt pulmonary edema. No active pulmonary disease. Electronically Signed   By: Ilona Sorrel M.D.   On: 12/03/2014 20:54    Microbiology: No results found for this or any previous visit (from the past 240 hour(s)).   Labs: Basic Metabolic Panel:  Recent Labs Lab 12/03/14 1919 12/04/14 0021 12/04/14 0532 12/05/14 0351  NA 143  --  145 142  K 4.1  --  3.4* 3.7  CL 110  --  108 103  CO2 26  --  29 28  GLUCOSE 109*  --  102* 120*  BUN 16  --  12 22*  CREATININE 1.05* 0.93 0.86 1.03*  CALCIUM 9.2  --  9.4 9.8  MG  --   --  1.9 2.1   Liver Function Tests: No results for input(s): AST, ALT, ALKPHOS, BILITOT, PROT, ALBUMIN in the last 168 hours. No results for input(s): LIPASE,  AMYLASE in the last 168 hours. No results for input(s): AMMONIA in the last 168 hours. CBC:  Recent Labs Lab 12/03/14 1919 12/04/14 0021  WBC 5.9 5.8  HGB 10.1* 10.9*  HCT 32.0* 34.3*  MCV 87.4 87.3  PLT 244 272   Cardiac Enzymes:  Recent Labs Lab 12/04/14 0021 12/04/14 0532 12/04/14 1200  TROPONINI <0.03 <0.03 <0.03   BNP: BNP (last 3 results)  Recent Labs  04/20/14 1220 10/14/14 1514 12/03/14 1919  BNP 838.8* 512.7* 884.0*    ProBNP (last 3 results) No results for input(s): PROBNP in the last 8760 hours.  CBG: No results for input(s): GLUCAP in the last 168 hours.  Time coordinating discharge: 35 minutes  Signed:  Nathaniel Yaden  [facility] 12/05/2014, 2:27 PM

## 2014-12-05 NOTE — Progress Notes (Signed)
  Echocardiogram 2D Echocardiogram has been performed.  Johny Chess 12/05/2014, 10:30 AM

## 2015-01-06 ENCOUNTER — Emergency Department (HOSPITAL_COMMUNITY)
Admission: EM | Admit: 2015-01-06 | Discharge: 2015-01-06 | Disposition: A | Discharge status: Home / Self Care | Payer: Medicare HMO | Attending: Emergency Medicine | Admitting: Emergency Medicine

## 2015-01-06 ENCOUNTER — Emergency Department (HOSPITAL_COMMUNITY): Payer: Medicare HMO

## 2015-01-06 ENCOUNTER — Encounter (HOSPITAL_COMMUNITY): Payer: Self-pay | Admitting: *Deleted

## 2015-01-06 DIAGNOSIS — Z7982 Long term (current) use of aspirin: Secondary | ICD-10-CM | POA: Insufficient documentation

## 2015-01-06 DIAGNOSIS — R059 Cough, unspecified: Secondary | ICD-10-CM

## 2015-01-06 DIAGNOSIS — Z79899 Other long term (current) drug therapy: Secondary | ICD-10-CM | POA: Diagnosis not present

## 2015-01-06 DIAGNOSIS — J40 Bronchitis, not specified as acute or chronic: Secondary | ICD-10-CM | POA: Diagnosis not present

## 2015-01-06 DIAGNOSIS — R05 Cough: Secondary | ICD-10-CM | POA: Diagnosis not present

## 2015-01-06 DIAGNOSIS — I509 Heart failure, unspecified: Secondary | ICD-10-CM | POA: Insufficient documentation

## 2015-01-06 DIAGNOSIS — R51 Headache: Secondary | ICD-10-CM | POA: Diagnosis present

## 2015-01-06 DIAGNOSIS — R519 Headache, unspecified: Secondary | ICD-10-CM

## 2015-01-06 DIAGNOSIS — R350 Frequency of micturition: Secondary | ICD-10-CM | POA: Diagnosis not present

## 2015-01-06 DIAGNOSIS — I10 Essential (primary) hypertension: Secondary | ICD-10-CM | POA: Diagnosis not present

## 2015-01-06 DIAGNOSIS — R1030 Lower abdominal pain, unspecified: Secondary | ICD-10-CM | POA: Insufficient documentation

## 2015-01-06 LAB — COMPREHENSIVE METABOLIC PANEL
ALT: 12 U/L — ABNORMAL LOW (ref 14–54)
AST: 16 U/L (ref 15–41)
Albumin: 3.6 g/dL (ref 3.5–5.0)
Alkaline Phosphatase: 69 U/L (ref 38–126)
Anion gap: 8 (ref 5–15)
BUN: 10 mg/dL (ref 6–20)
CO2: 28 mmol/L (ref 22–32)
Calcium: 9.4 mg/dL (ref 8.9–10.3)
Chloride: 108 mmol/L (ref 101–111)
Creatinine, Ser: 0.91 mg/dL (ref 0.44–1.00)
GFR calc Af Amer: >60 mL/min (ref >60)
GFR calc non Af Amer: >60 mL/min (ref >60)
Glucose, Bld: 106 mg/dL — ABNORMAL HIGH (ref 65–99)
Potassium: 4 mmol/L (ref 3.5–5.1)
Sodium: 144 mmol/L (ref 135–145)
Total Bilirubin: 0.5 mg/dL (ref 0.3–1.2)
Total Protein: 7.7 g/dL (ref 6.5–8.1)

## 2015-01-06 LAB — CBC WITH DIFFERENTIAL/PLATELET
Basophils Absolute: 0 10*3/uL (ref 0.0–0.1)
Basophils Relative: 0 %
Eosinophils Absolute: 0.1 10*3/uL (ref 0.0–0.7)
Eosinophils Relative: 3 %
HCT: 33.8 % — ABNORMAL LOW (ref 36.0–46.0)
Hemoglobin: 11.1 g/dL — ABNORMAL LOW (ref 12.0–15.0)
Lymphocytes Relative: 30 %
Lymphs Abs: 1.6 10*3/uL (ref 0.7–4.0)
MCH: 28 pg (ref 26.0–34.0)
MCHC: 32.8 g/dL (ref 30.0–36.0)
MCV: 85.4 fL (ref 78.0–100.0)
Monocytes Absolute: 0.3 10*3/uL (ref 0.1–1.0)
Monocytes Relative: 5 %
Neutro Abs: 3.3 10*3/uL (ref 1.7–7.7)
Neutrophils Relative %: 62 %
Platelets: 276 10*3/uL (ref 150–400)
RBC: 3.96 MIL/uL (ref 3.87–5.11)
RDW: 13.5 % (ref 11.5–15.5)
WBC: 5.3 10*3/uL (ref 4.0–10.5)

## 2015-01-06 MED ORDER — IBUPROFEN 800 MG PO TABS
800.0000 mg | ORAL_TABLET | Freq: Once | ORAL | Status: AC
  Administered 2015-01-06: 800 mg via ORAL
  Filled 2015-01-06: qty 1

## 2015-01-06 MED ORDER — PREDNISONE 10 MG PO TABS: 50.0000 mg | ORAL_TABLET | Freq: Every day | ORAL | Status: AC

## 2015-01-06 MED ORDER — HYDROCODONE-ACETAMINOPHEN 5-325 MG PO TABS: 1.0000 | ORAL_TABLET | ORAL | Status: DC | PRN

## 2015-01-06 MED ORDER — ALBUTEROL SULFATE (2.5 MG/3ML) 0.083% IN NEBU
2.5000 mg | INHALATION_SOLUTION | Freq: Once | RESPIRATORY_TRACT | Status: AC
  Administered 2015-01-06: 2.5 mg via RESPIRATORY_TRACT
  Filled 2015-01-06: qty 3

## 2015-01-06 NOTE — Discharge Instructions (Signed)
Abdominal Pain, Adult °Many things can cause abdominal pain. Usually, abdominal pain is not caused by a disease and will improve without treatment. It can often be observed and treated at home. Your health care provider will do a physical exam and possibly order blood tests and X-rays to help determine the seriousness of your pain. However, in many cases, more time must pass before a clear cause of the pain can be found. Before that point, your health care provider may not know if you need more testing or further treatment. °HOME CARE INSTRUCTIONS °Monitor your abdominal pain for any changes. The following actions may help to alleviate any discomfort you are experiencing: °· Only take over-the-counter or prescription medicines as directed by your health care provider. °· Do not take laxatives unless directed to do so by your health care provider. °· Try a clear liquid diet (broth, tea, or water) as directed by your health care provider. Slowly move to a bland diet as tolerated. °SEEK MEDICAL CARE IF: °· You have unexplained abdominal pain. °· You have abdominal pain associated with nausea or diarrhea. °· You have pain when you urinate or have a bowel movement. °· You experience abdominal pain that wakes you in the night. °· You have abdominal pain that is worsened or improved by eating food. °· You have abdominal pain that is worsened with eating fatty foods. °· You have a fever. °SEEK IMMEDIATE MEDICAL CARE IF: °· Your pain does not go away within 2 hours. °· You keep throwing up (vomiting). °· Your pain is felt only in portions of the abdomen, such as the right side or the left lower portion of the abdomen. °· You pass bloody or black tarry stools. °MAKE SURE YOU: °· Understand these instructions. °· Will watch your condition. °· Will get help right away if you are not doing well or get worse. °  °This information is not intended to replace advice given to you by your health care provider. Make sure you discuss  any questions you have with your health care provider. °  °Document Released: 09/27/2004 Document Revised: 09/08/2014 Document Reviewed: 08/27/2012 °Elsevier Interactive Patient Education ©2016 Elsevier Inc. ° °Cough, Adult °Coughing is a reflex that clears your throat and your airways. Coughing helps to heal and protect your lungs. It is normal to cough occasionally, but a cough that happens with other symptoms or lasts a long time may be a sign of a condition that needs treatment. A cough may last only 2-3 weeks (acute), or it may last longer than 8 weeks (chronic). °CAUSES °Coughing is commonly caused by: °· Breathing in substances that irritate your lungs. °· A viral or bacterial respiratory infection. °· Allergies. °· Asthma. °· Postnasal drip. °· Smoking. °· Acid backing up from the stomach into the esophagus (gastroesophageal reflux). °· Certain medicines. °· Chronic lung problems, including COPD (or rarely, lung cancer). °· Other medical conditions such as heart failure. °HOME CARE INSTRUCTIONS  °Pay attention to any changes in your symptoms. Take these actions to help with your discomfort: °· Take medicines only as told by your health care provider. °¨ If you were prescribed an antibiotic medicine, take it as told by your health care provider. Do not stop taking the antibiotic even if you start to feel better. °¨ Talk with your health care provider before you take a cough suppressant medicine. °· Drink enough fluid to keep your urine clear or pale yellow. °· If the air is dry, use a cold steam vaporizer   or humidifier in your bedroom or your home to help loosen secretions.  Avoid anything that causes you to cough at work or at home.  If your cough is worse at night, try sleeping in a semi-upright position.  Avoid cigarette smoke. If you smoke, quit smoking. If you need help quitting, ask your health care provider.  Avoid caffeine.  Avoid alcohol.  Rest as needed. SEEK MEDICAL CARE IF:   You  have new symptoms.  You cough up pus.  Your cough does not get better after 2-3 weeks, or your cough gets worse.  You cannot control your cough with suppressant medicines and you are losing sleep.  You develop pain that is getting worse or pain that is not controlled with pain medicines.  You have a fever.  You have unexplained weight loss.  You have night sweats. SEEK IMMEDIATE MEDICAL CARE IF:  You cough up blood.  You have difficulty breathing.  Your heartbeat is very fast.   This information is not intended to replace advice given to you by your health care provider. Make sure you discuss any questions you have with your health care provider.   Document Released: 06/16/2010 Document Revised: 09/08/2014 Document Reviewed: 02/24/2014 Elsevier Interactive Patient Education 2016 [place] Headache Without Cause A headache is pain or discomfort felt around the head or neck area. The specific cause of a headache may not be found. There are many causes and types of headaches. A few common ones are:  Tension headaches.  Migraine headaches.  Cluster headaches.  Chronic daily headaches. HOME CARE INSTRUCTIONS  Watch your condition for any changes. Take these steps to help with your condition: Managing Pain  Take over-the-counter and prescription medicines only as told by your health care provider.  Lie down in a dark, quiet room when you have a headache.  If directed, apply ice to the head and neck area:  Put ice in a plastic bag.  Place a towel between your skin and the bag.  Leave the ice on for 20 minutes, 2-3 times per day.  Use a heating pad or hot shower to apply heat to the head and neck area as told by your health care provider.  Keep lights dim if bright lights bother you or make your headaches worse. Eating and Drinking  Eat meals on a regular schedule.  Limit alcohol use.  Decrease the amount of caffeine you drink, or stop drinking  caffeine. General Instructions  Keep all follow-up visits as told by your health care provider. This is important.  Keep a headache journal to help find out what may trigger your headaches. For example, write down:  What you eat and drink.  How much sleep you get.  Any change to your diet or medicines.  Try massage or other relaxation techniques.  Limit stress.  Sit up straight, and do not tense your muscles.  Do not use tobacco products, including cigarettes, chewing tobacco, or e-cigarettes. If you need help quitting, ask your health care provider.  Exercise regularly as told by your health care provider.  Sleep on a regular schedule. Get 7-9 hours of sleep, or the amount recommended by your health care provider. SEEK MEDICAL CARE IF:   Your symptoms are not helped by medicine.  You have a headache that is different from the usual headache.  You have nausea or you vomit.  You have a fever. SEEK IMMEDIATE MEDICAL CARE IF:   Your headache becomes severe.  You have  repeated vomiting.  You have a stiff neck.  You have a loss of vision.  You have problems with speech.  You have pain in the eye or ear.  You have muscular weakness or loss of muscle control.  You lose your balance or have trouble walking.  You feel faint or pass out.  You have confusion.   This information is not intended to replace advice given to you by your health care provider. Make sure you discuss any questions you have with your health care provider.   Document Released: 12/18/2004 Document Revised: 09/08/2014 Document Reviewed: 04/12/2014 Elsevier Interactive Patient Education 2016 [employer].   Emergency Department Resource Guide 1) Find a Doctor and Pay Out of Pocket Although you won't have to find out who is covered by your insurance plan, it is a good idea to ask around and get recommendations. You will then need to call the office and see if the doctor you have chosen will  accept you as a new patient and what types of options they offer for patients who are self-pay. Some doctors offer discounts or will set up payment plans for their patients who do not have insurance, but you will need to ask so you aren't surprised when you get to your appointment.  2) Contact Your Local Health Department Not all health departments have doctors that can see patients for sick visits, but many do, so it is worth a call to see if yours does. If you don't know where your local health department is, you can check in your phone book. The CDC also has a tool to help you locate your state's health department, and many state websites also have listings of all of their local health departments.  3) Find a [facility] If your illness is not likely to be very severe or complicated, you may want to try a walk in clinic. These are popping up all over the country in pharmacies, drugstores, and shopping centers. They're usually staffed by nurse practitioners or physician assistants that have been trained to treat common illnesses and complaints. They're usually fairly quick and inexpensive. However, if you have serious medical issues or chronic medical problems, these are probably not your best option.  No Primary Care Doctor: - Call Health Connect at  [phone] - they can help you locate a primary care doctor that  accepts your insurance, provides certain services, etc. - Physician Referral Service- [phone]  Chronic Pain Problems: Organization         Address  Phone   Notes  [facility]  [phone] Patients need to be referred by their primary care doctor.   Medication Assistance: Organization         Address  Phone   Notes  [facility] Medication [facility] [address] [place], [place] [address] [phone] --Must be a resident of [facility] -- Must have NO insurance coverage whatsoever (no Medicaid/ Medicare, etc.) -- The pt.  MUST have a primary care doctor that directs their care regularly and follows them in the community   MedAssist  [phone]   [employer]  [phone]    Agencies that provide inexpensive medical care: Organization         Address  Phone   Notes  [place]  [phone]   [facility]    [phone]   [facility] [place], [place] [address] [phone]   [place] [address] [place] [phone]   Planned Parenthood    [phone]   [facility]    [phone]   [place] and [place], [place] Phone:  [phone], Fax:  [phone] Hours of Operation:  9 am - 6 pm, M-F.  Also accepts Medicaid/Medicare and self-pay.  [facility] for [place] [address], [address], [place] Phone: [phone], Fax: [phone]. Hours of Operation:  8:30 am - 5:30 pm, M-F.  Also accepts Medicaid and self-pay.  [facility] [address], [place] Phone: [phone]   [place], [place], [place] [phone], Ext. [phone] Mondays & Thursdays: 7-9 AM.  First 15 patients are seen on a first come, first serve basis.    [place] Providers:  Organization         Address  Phone   Notes  [facility] [address],  [phone] Also accepts self-pay patients.  [facility] [id number] [place], [place]  [phone]   [place], [address], [place] [phone]   [facility] [address] [place] [phone]   Lucianne Lei [address], [place]   [phone] Only accepts [place] Access [place] patients after they have their name applied to their card.   Self-Pay (no insurance) in  [facility]:  Organization         Address  Phone   Notes  Sickle Cell Patients, [facility] [place] [phone]   [facility] [place] [phone]   [facility] [place]  [place], [place], [place] [phone]   [facility]/Dr. Osei-Bonsu  [address] [place] or [address], [place] [phone] Phone number for both [place] and [place] locations is the same.  Urgent Medical and [facility] [address], [place] [phone]   [facility] [address] [place] or [address] [phone]   [facility] [address], [place] [phone], phone; [phone], fax Sees patients 1st and 3rd Saturday of every month.  Must not qualify for public or private insurance (i.e. Medicaid, Medicare,  Health Choice, Veterans' Benefits)  Household income should be no more than 200% of the poverty level The clinic cannot treat you if you are pregnant or think you are pregnant  Sexually transmitted diseases are not treated at the clinic.    Dental Care: Organization         Address  Phone  Notes  [facility] Department of [facility] [place] [phone] Accepts children up to age 44 who are enrolled in [place] or [place]; pregnant women with a Medicaid card; and children who have applied for Medicaid or  Health Choice, but were declined, whose parents can pay a reduced fee at time of service.  [facility] Department of [facility]  [address], [place] [phone] Accepts children up to age 52 who are enrolled in [place] or [place]; pregnant women with a  Medicaid card; and children who have applied for Medicaid or [place] Health Choice, but were declined, whose  parents can pay a reduced fee at time of service.  [place] Adult [facility] Access PROGRAM  [place] [phone] Patients are seen by appointment only. Walk-ins are not accepted. [place] will see patients 87 years of age and older. Monday - Tuesday (8am-5pm) Most Wednesdays (8:30-5pm) $30 per visit, cash only  [facility] Adult Dental Access PROGRAM  [address], [facility] [phone] Patients are seen by appointment only. Walk-ins are not accepted. [place] will see patients 62 years of age and older. One Wednesday Evening (Monthly: Volunteer Based).  $30 per visit, cash only  [place]  [phone] for adults; Children under age 41, call Graduate Pediatric Dentistry at [phone]. Children aged 36-14, please call [phone] to request a pediatric application.  Dental services are provided in all areas of dental care including fillings, crowns and bridges, complete and partial dentures, implants, gum treatment, root canals, and extractions. Preventive care is also provided. Treatment is provided to both adults and children. Patients are selected via a lottery and there is often a waiting list.   [facility] [address] [place]  [phone] [email or web address]   [facility] [address] [place], [place] [phone], Ext. [phone] Second and Fourth Thursday of each month, opens at 6:30 AM; Clinic ends at 9 AM.  Patients are seen on a first-come first-served basis, and a limited number are seen during each clinic.   [facility]  [address] Hillard Danker [place], [place] [phone]   Eligibility Requirements You must have lived in [place], [place], or [place] counties for at least the last three months.   You cannot be eligible for state or federal sponsored [employer], including [employer], [place], or [employer].   You generally cannot be eligible for  healthcare insurance through your employer.    How to apply: Eligibility screenings are held every Tuesday and Wednesday afternoon from 1:00 pm until 4:00 pm. You do not need an appointment for the interview!  [facility] [address] [place], [place]   [place]  [place] Department  [place]  [phone]    Behavioral Health Resources in the Community: Intensive Outpatient Programs Organization         Address  Phone  Notes  [employer] [address] [place], [place] [phone]   [facility] Outpatient [address] [place], [place]   ADS: [facility] Drug [facility] [address], [place], [place]   [place] [address]  [place], [place] or [phone]   Substance Abuse Resources Organization         Address  Phone  Notes  Alcohol and Drug Services  [phone]   [place]  [phone]   [facility] [place]   Chinita Pester  [phone]   Residential & Outpatient Substance Abuse Program  [phone]   Psychological Services Organization         Address  Phone  Notes  [facility] [place]  [place]  [phone]   [place] [address] [place] or [phone]    Mobile Crisis Teams Organization  Address  Phone  Notes  [facility], Mobile Crisis Care Unit  [phone]   [facility]  [address] [place], [place]   [facility] [address], [place] [phone]    Self-Help/Support Groups Organization         Address  Phone             Notes  [place]. of [place] - variety of support groups  [place] Call for more information  Narcotics  Anonymous (NA), Caring Services [address], [employer] [place]  2 meetings at this location   Special educational needs teacher         Address  Phone  Notes  [facility] [place],    [place]  [phone]   [facility]  [address], [place] [id number], [place], [place]   [place], [place] [phone] Admissions: 8am-3pm M-F  Incentives Substance [place] [address]    [place], [place] [phone]   [facility] [address] [place], [place], [place]   [facility] [address],  [place], [place]   [facility] - Intensive Outpatient [address] Kristeen [address], [place], [place]   [facility] ([place].) [place].,  [place], [place] [phone] or [phone]   [facility] (RTS) [address] [place], [place] Accepts Medicaid  [facility] [place] [address],  [place] [phone] Substance Abuse/Addiction Treatment   [facility] Organization         Address  Phone  Notes  [facility]  [phone]   Domenic Schwab, PhD [address] Arlis Porta [place], [place]   [phone] or [phone]   [place], [place] [phone]   [facility] [address] [place], [place] [phone] Insurance/Medicaid/sponsorship through [facility] and Families [address] [place]                                    [place], [place] [phone] [place] [address], [place] [phone]    Dr. Adele Schilder  [phone]   [facility] Dept. 1) [address] [place] 2) [place] 3)  [place] [address], [place] [phone]  [phone]   [place] [phone] or [phone] (After Hours)

## 2015-01-06 NOTE — ED Notes (Signed)
Pt developed cough with fever.  Pt reports headache that starts with coughing and feels like face was splitting. No headache now and states her lower abdomen hurts with coughing.

## 2015-01-06 NOTE — ED Provider Notes (Signed)
CSN: [id number]     Arrival date & time 01/06/15  1212 History   First MD Initiated Contact with Patient 01/06/15 1819     Chief Complaint  Patient presents with  . Headache  . Cough  . Abdominal Pain     (Consider location/radiation/quality/duration/timing/severity/associated sxs/prior Treatment) Patient is a 65 y.o. female presenting with cough and abdominal pain. The history is provided by the patient.  Cough Cough characteristics:  Dry, hacking and non-productive Severity:  Moderate Onset quality:  Gradual Duration:  7 days Timing:  Intermittent Progression:  Waxing and waning Chronicity:  New Smoker: no   Context: weather changes   Context: not animal exposure, not exposure to allergens, not sick contacts and not smoke exposure   Relieved by:  Nothing Worsened by:  Environmental changes Ineffective treatments:  Rest, decongestant and cough suppressants Associated symptoms: chills (Pt reports chills a week ago which resolved several days ago), fever (Pt reports subjective fever 1 week ago) and headaches (with cough)   Associated symptoms: no chest pain, no diaphoresis, no ear fullness, no ear pain, no eye discharge, no myalgias, no rash, no rhinorrhea, no shortness of breath, no sinus congestion, no sore throat and no wheezing   Risk factors: no recent infection and no recent travel   Abdominal Pain Associated symptoms: chills (Pt reports chills a week ago which resolved several days ago), cough and fever (Pt reports subjective fever 1 week ago)   Associated symptoms: no chest pain, no constipation, no diarrhea, no dysuria, no nausea, no shortness of breath, no sore throat, no vaginal bleeding, no vaginal discharge and no vomiting     Past Medical History  Diagnosis Date  . Hypertension   . CHF (congestive heart failure) [facility])    Past Surgical History  Procedure Laterality Date  . Rotator cuff repair     Family History  Problem Relation Age of Onset  . CAD Mother   .  Sickle cell anemia Mother   . CAD Father   . Diabetes type II Father    Social History  Substance Use Topics  . Smoking status: Passive Smoke Exposure - Never Smoker    Types: Cigarettes  . Smokeless tobacco: None  . Alcohol Use: No   OB History    No data available     Review of Systems  Constitutional: Positive for fever (Pt reports subjective fever 1 week ago) and chills (Pt reports chills a week ago which resolved several days ago). Negative for diaphoresis.  HENT: Negative for congestion, ear pain, rhinorrhea and sore throat.   Eyes: Negative for pain and discharge.  Respiratory: Positive for cough. Negative for chest tightness, shortness of breath and wheezing.   Cardiovascular: Negative for chest pain.  Gastrointestinal: Positive for abdominal pain. Negative for nausea, vomiting, diarrhea, constipation, blood in stool, abdominal distention and rectal pain.  Genitourinary: Positive for frequency. Negative for dysuria, vaginal bleeding, vaginal discharge and vaginal pain.  Musculoskeletal: Negative for myalgias.  Skin: Negative for rash.  Neurological: Positive for headaches (with cough). Negative for dizziness, tremors, speech difficulty, weakness, light-headedness and numbness.  Psychiatric/Behavioral: Negative for confusion.      Allergies  Review of patient's allergies indicates no known allergies.  Home Medications   Prior to Admission medications   Medication Sig Start Date End Date Taking? Authorizing Provider  aspirin EC 81 MG tablet Take 81 mg by mouth daily.   Yes Historical Provider, MD  carvedilol (COREG) 12.5 MG tablet Take 1 tablet (12.5 mg  total) by mouth 2 (two) times daily with a meal. 12/05/14  Yes Janece Canterbury, MD  Chlorphen-Pseudoephed-APAP [facility] FLU/COLD PO) Take 1 packet by mouth every 8 (eight) hours as needed (cold symptoms).   Yes Historical Provider, MD  furosemide (LASIX) 20 MG tablet Take 1 tablet (20 mg total) by mouth daily. 12/05/14   Yes Janece Canterbury, MD  losartan (COZAAR) 25 MG tablet Take 1 tablet (25 mg total) by mouth daily. 12/05/14  Yes Janece Canterbury, MD  Menthol, Topical Analgesic, (BENGAY EX) Apply 1 application topically daily as needed (pain).   Yes Historical Provider, MD  Multiple Vitamins-Minerals (AIRBORNE PO) Take 1 tablet by mouth every 4 (four) hours as needed (immune support).   Yes Historical Provider, MD  vitamin B-12 1000 MCG tablet Take 1 tablet (1,000 mcg total) by mouth daily. 12/05/14  Yes Janece Canterbury, MD  HYDROcodone-acetaminophen (NORCO/VICODIN) 5-325 MG tablet Take 1 tablet by mouth every 4 (four) hours as needed (Please take at nightime as needed for cough and pain). 01/06/15   Esaw Grandchild, MD  predniSONE (DELTASONE) 10 MG tablet Take 5 tablets (50 mg total) by mouth daily. 01/06/15 01/10/15  Esaw Grandchild, MD   BP 105/78 mmHg  Pulse 94  Temp(Src) 98.1 F (36.7 C) (Oral)  Resp 18  SpO2 100% Physical Exam  Constitutional: She is oriented to person, place, and time. She appears well-developed and well-nourished. No distress.  HENT:  Head: Normocephalic and atraumatic.  Eyes: Conjunctivae and EOM are normal. Pupils are equal, round, and reactive to light.  Neck: Normal range of motion. Neck supple.  Cardiovascular: Normal rate and regular rhythm.  Exam reveals no gallop and no friction rub.   No murmur heard. Pulmonary/Chest: Effort normal and breath sounds normal. No accessory muscle usage. No tachypnea. No respiratory distress. She has no wheezes. She has no rales. She exhibits no tenderness.  Abdominal: Soft. Bowel sounds are normal. She exhibits no distension and no mass. There is no tenderness. There is no rebound and no guarding.  Neurological: She is alert and oriented to person, place, and time. No cranial nerve deficit. Coordination normal.  Skin: Skin is warm and dry. No rash noted. She is not diaphoretic. No erythema. No pallor.  Psychiatric: She has a normal mood and affect.  Her behavior is normal. Thought content normal.    ED Course  Procedures (including critical care time) Labs Review Labs Reviewed  COMPREHENSIVE METABOLIC PANEL - Abnormal; Notable for the following:    Glucose, Bld 106 (*)    ALT 12 (*)    All other components within normal limits  CBC WITH DIFFERENTIAL/PLATELET - Abnormal; Notable for the following:    Hemoglobin 11.1 (*)    HCT 33.8 (*)    All other components within normal limits    Imaging Review Dg Chest 2 View  01/06/2015  CLINICAL DATA:  Productive cough and congestion for 1 week. History of hypertension and CHF EXAM: CHEST  2 VIEW COMPARISON:  12/03/2014; 10/14/2014; 05/26/2014; chest CT - 10/14/2014 FINDINGS: Grossly unchanged enlarged cardiac silhouette and mediastinal contours with mild tortuosity of the thoracic aorta. Mild pulmonary venous congestion without frank evidence of edema. Minimal bibasilar linear heterogeneous opacities, left greater than right, favored to represent atelectasis. No pleural effusion or pneumothorax. No acute osseus abnormalities. Linear calcification overlying the superior lateral aspect of the humeral head may represent the sequela of calcific tendinitis. Post cholecystectomy. IMPRESSION: Similar findings of cardiomegaly and pulmonary venous congestion without acute cardiopulmonary disease. Electronically Signed  By: Sandi Mariscal M.D.   On: 01/06/2015 14:13   I have personally reviewed and evaluated these images and lab results as part of my medical decision-making.   EKG Interpretation None      MDM   Final diagnoses:  Cough  Nonintractable headache, unspecified chronicity pattern, unspecified headache type  Lower abdominal pain  Bronchitis   65 year old black female with past nuchal history of CHF presents in setting of cough. Patient reports around [place] she developed a cough and chills. She reports she had subjective fever for several days ROM at time. She reports her cough was  nonproductive. She reports is that time she's had continuing cough and last night was unable to sleep due to continued cough. She reports since that time she's developed a headache when she has a bad cough and lower abdominal pain with cough. She additionally reports some mild urinary frequency with cough. Due to difficulty sleeping continued cough she presented to emergency department today. She reports she does not have feelings of fevers for several days. She was afebrile on presentation. Patient additionally denied any vision change, chest pain, shortness of breath, nausea, vomiting, Cox patient, diarrhea, rashes, recent sick contacts, recent travel.  Patient had laboratory analysis performed in triage which does not reveal elevation of white blood cell count. No signs of anemia. No significant intra-abdominal abnormalities. Chest x-ray revealed no new pneumonia or focal consolidation. Patient has signs of pulmonary congestion which are unchanged from baseline in setting of CHF. She has mild cardiomegaly which additionally is unchanged from previous chest x-rays. Physical examination patient's lungs are clear to auscultation bilaterally. She does not appear in distress. Due to urinary complaints we'll await urinalysis. Additionally we'll give patient albuterol nebulized treatment due to possible bronchoconstriction. Patient will receive ibuprofen for headache. Believe this is likely viral upper respiratory infection versus bronchitis versus cough secondary to CHF. Do not believe further analysis is necessary for CHF exacerbation versus ACS or other intra-abdominal pathology. Patient's vital signs within normal limits and she reports complaints of headache and abdominal pain are in context of cough.  On reevaluation patient reported improvement headache with ibuprofen. Patient reported significant improvement in cough with albuterol. After further discussion with patient she denies urinary frequency and  reports some mild urge incontinence with cough. Do not believe urinalysis is indicated at this time. Believe cough is likely secondary to bronchitis. Patient discharged home with short course of steroids and short course of narcotic cough regimen. Patient given strict return precautions and stable at time of discharge. Patient advised to establish and follow up with PCP within one week for reevaluation.  Attending has seen and evaluated patient and Dr. Roxanne Mins is in agreement with plan.    Esaw Grandchild, MD [phone]  Delora Fuel, MD [phone] [id number]

## 2016-02-23 ENCOUNTER — Encounter (HOSPITAL_COMMUNITY): Payer: Self-pay | Admitting: Emergency Medicine

## 2016-02-23 ENCOUNTER — Emergency Department (HOSPITAL_COMMUNITY): Payer: Medicare HMO

## 2016-02-23 ENCOUNTER — Emergency Department (HOSPITAL_COMMUNITY)
Admission: EM | Admit: 2016-02-23 | Discharge: 2016-02-23 | Disposition: A | Discharge status: Home / Self Care | Payer: Medicare HMO | Attending: Emergency Medicine | Admitting: Emergency Medicine

## 2016-02-23 DIAGNOSIS — I110 Hypertensive heart disease with heart failure: Secondary | ICD-10-CM | POA: Diagnosis not present

## 2016-02-23 DIAGNOSIS — M25561 Pain in right knee: Secondary | ICD-10-CM | POA: Diagnosis present

## 2016-02-23 DIAGNOSIS — Y999 Unspecified external cause status: Secondary | ICD-10-CM | POA: Diagnosis not present

## 2016-02-23 DIAGNOSIS — Y929 Unspecified place or not applicable: Secondary | ICD-10-CM | POA: Insufficient documentation

## 2016-02-23 DIAGNOSIS — W19XXXA Unspecified fall, initial encounter: Secondary | ICD-10-CM

## 2016-02-23 DIAGNOSIS — M25461 Effusion, right knee: Secondary | ICD-10-CM

## 2016-02-23 DIAGNOSIS — Y93E5 Activity, floor mopping and cleaning: Secondary | ICD-10-CM | POA: Insufficient documentation

## 2016-02-23 DIAGNOSIS — Z7982 Long term (current) use of aspirin: Secondary | ICD-10-CM | POA: Insufficient documentation

## 2016-02-23 DIAGNOSIS — I5043 Acute on chronic combined systolic (congestive) and diastolic (congestive) heart failure: Secondary | ICD-10-CM | POA: Diagnosis not present

## 2016-02-23 DIAGNOSIS — Z7722 Contact with and (suspected) exposure to environmental tobacco smoke (acute) (chronic): Secondary | ICD-10-CM | POA: Insufficient documentation

## 2016-02-23 DIAGNOSIS — W010XXA Fall on same level from slipping, tripping and stumbling without subsequent striking against object, initial encounter: Secondary | ICD-10-CM | POA: Insufficient documentation

## 2016-02-23 MED ORDER — HYDROCODONE-ACETAMINOPHEN 5-325 MG PO TABS
1.0000 | ORAL_TABLET | Freq: Once | ORAL | Status: AC
  Administered 2016-02-23: 1 via ORAL
  Filled 2016-02-23: qty 1

## 2016-02-23 MED ORDER — HYDROCODONE-ACETAMINOPHEN 5-325 MG PO TABS: 1.0000 | ORAL_TABLET | ORAL | 0 refills | Status: DC | PRN

## 2016-02-23 NOTE — Progress Notes (Signed)
Orthopedic Tech Progress Note Patient Details:  Tischa Coopman [facility] [birth year] [id number]  Ortho Devices Type of Ortho Device: Knee Immobilizer Ortho Device/Splint Location: rle Ortho Device/Splint Interventions: Application   Hildred Priest 02/23/2016, 3:49 PM

## 2016-02-23 NOTE — ED Notes (Signed)
Pt placed in room by Xray

## 2016-02-23 NOTE — ED Notes (Signed)
ED Provider at bedside. 

## 2016-02-23 NOTE — ED Notes (Signed)
Patient transported to X-ray 

## 2016-02-23 NOTE — ED Provider Notes (Signed)
[facility] DEPT Provider Note   CSN: [id number] Arrival date & time: 02/23/16  1228  By signing my name below, I, Evelene Croon, attest that this documentation has been prepared under the direction and in the presence of Isla Pence, MD . Electronically Signed: Evelene Croon, Scribe. 02/23/2016. 2:20 PM.  History   Chief Complaint Chief Complaint  Patient presents with  . Leg Pain  . Fall    The history is provided by the patient. No language interpreter was used.     HPI Comments:  Deborah Holmes is a 66 y.o. female who presents to the Emergency Department s/p fall yesterday evening complaining of 10/10 right knee pain following the incident. She reports difficulty bearing weight on the extremity secondary to pain. Pt slipped on wax and landed on her right side. She reports associated right shoulder pain, and right hip pain. No alleviating factors noted.     Past Medical History:  Diagnosis Date  . CHF (congestive heart failure) ([place])   . Hypertension     Patient Active Problem List   Diagnosis Date Noted  . Anemia 12/03/2014  . CHF exacerbation ([place]) 12/03/2014  . Hypertension 12/03/2014  . Acute on chronic combined systolic and diastolic heart failure [facility])     Past Surgical History:  Procedure Laterality Date  . ROTATOR CUFF REPAIR      OB History    No data available       Home Medications    Prior to Admission medications   Medication Sig Start Date End Date Taking? Authorizing Provider  aspirin EC 81 MG tablet Take 81 mg by mouth daily.    Historical Provider, MD  carvedilol (COREG) 12.5 MG tablet Take 1 tablet (12.5 mg total) by mouth 2 (two) times daily with a meal. 12/05/14   Janece Canterbury, MD  Chlorphen-Pseudoephed-APAP [facility] FLU/COLD PO) Take 1 packet by mouth every 8 (eight) hours as needed (cold symptoms).    Historical Provider, MD  furosemide (LASIX) 20 MG tablet Take 1 tablet (20 mg total) by mouth daily. 12/05/14   Janece Canterbury, MD  HYDROcodone-acetaminophen (NORCO/VICODIN) 5-325 MG tablet Take 1 tablet by mouth every 4 (four) hours as needed. 02/23/16   Isla Pence, MD  losartan (COZAAR) 25 MG tablet Take 1 tablet (25 mg total) by mouth daily. 12/05/14   Janece Canterbury, MD  Menthol, Topical Analgesic, (BENGAY EX) Apply 1 application topically daily as needed (pain).    Historical Provider, MD  Multiple Vitamins-Minerals (AIRBORNE PO) Take 1 tablet by mouth every 4 (four) hours as needed (immune support).    Historical Provider, MD  vitamin B-12 1000 MCG tablet Take 1 tablet (1,000 mcg total) by mouth daily. 12/05/14   Janece Canterbury, MD    Family History Family History  Problem Relation Age of Onset  . CAD Mother   . Sickle cell anemia Mother   . CAD Father   . Diabetes type II Father     Social History Social History  Substance Use Topics  . Smoking status: Passive Smoke Exposure - Never Smoker    Types: Cigarettes  . Smokeless tobacco: Not on file  . Alcohol use No     Allergies   Patient has no known allergies.   Review of Systems Review of Systems  Musculoskeletal: Positive for arthralgias, joint swelling and myalgias.  Neurological: Negative for syncope.  All other systems reviewed and are negative.    Physical Exam Updated Vital Signs BP 156/85 (BP Location: Right Arm)  Pulse 86   Temp 98.4 F (36.9 C) (Oral)   Resp 18   Ht 5\' 6"  (1.676 m)   Wt 240 lb (108.9 kg)   SpO2 99%   BMI 38.74 kg/m   Physical Exam  Constitutional: She is oriented to person, place, and time. She appears well-developed and well-nourished. No distress.  HENT:  Head: Normocephalic and atraumatic.  Eyes: Conjunctivae are normal.  Cardiovascular: Normal rate and regular rhythm.   Pulmonary/Chest: Effort normal and breath sounds normal. No respiratory distress.  Abdominal: Soft. She exhibits no distension. There is no tenderness.  Musculoskeletal: She exhibits tenderness.  Swelling and tenderness  to the right knee   Neurological: She is alert and oriented to person, place, and time.  Skin: Skin is warm and dry.  Psychiatric: She has a normal mood and affect.  Nursing note and vitals reviewed.    ED Treatments / Results  DIAGNOSTIC STUDIES:  Oxygen Saturation is 99% on RA, normal by my interpretation.    COORDINATION OF CARE:  2:11 PM Discussed treatment plan with pt at bedside and pt agreed to plan.  Labs (all labs ordered are listed, but only abnormal results are displayed) Labs Reviewed - No data to display  EKG  EKG Interpretation None       Radiology Dg Shoulder Right  Result Date: 02/23/2016 CLINICAL DATA:  Fall. EXAM: RIGHT SHOULDER - 2+ VIEW COMPARISON:  CT 06/28/2009 . FINDINGS: Prominent acromioclavicular and glenohumeral degenerative change. Subacromial spurring. High-riding shoulder suggesting rotator cuff tear. No evidence of fracture or dislocation. Small metallic density noted over the soft tissues on the Y-view only. IMPRESSION: Prominent acromioclavicular glenohumeral degenerative change. Subacromial spurring. High-riding shoulder suggesting rotator cuff tear. No acute abnormality. No evidence of fracture or dislocation. Electronically Signed   By: Marcello Moores  Register   On: 02/23/2016 14:08   Dg Knee Complete 4 Views Right  Result Date: 02/23/2016 CLINICAL DATA:  [place] yesterday injuring the right knee, painful to bear weight EXAM: RIGHT KNEE - COMPLETE 4+ VIEW COMPARISON:  Right knee films of 03/11/2010 FINDINGS: There is tricompartmental degenerative joint disease of the knee with progression of degenerative change primarily involving the lateral compartment. Laterally there is more loss of joint space with sclerosis and spurring present. No acute fracture is seen. There is a small to moderate size right knee joint effusion present. IMPRESSION: 1. Tricompartmental degenerative joint disease of the right knee with predominant degenerative change involving the  lateral compartment. No fracture. 2. Right knee joint effusion. Electronically Signed   By: Ivar Drape M.D.   On: 02/23/2016 14:03   Dg Hip Unilat W Or Wo Pelvis 2-3 Views Right  Result Date: 02/23/2016 CLINICAL DATA:  Status post fall with right leg pain. EXAM: DG HIP (WITH OR WITHOUT PELVIS) 2-3V RIGHT COMPARISON:  None. FINDINGS: There is no evidence of hip fracture or dislocation. There is no evidence of arthropathy or other focal bone abnormality. IMPRESSION: No acute fracture or dislocation identified about the right hip. Electronically Signed   By: Fidela Salisbury M.D.   On: 02/23/2016 15:21    Procedures Procedures (including critical care time)  Medications Ordered in ED Medications  HYDROcodone-acetaminophen (NORCO/VICODIN) 5-325 MG per tablet 1 tablet (1 tablet Oral Given 02/23/16 1447)     Initial Impression / Assessment and Plan / ED Course  I have reviewed the triage vital signs and the nursing notes.  Pertinent labs & imaging results that were available during my care of the patient were reviewed  by me and considered in my medical decision making (see chart for details).    Pt is feeling better.  She will be placed in a knee immobilizer and instructed to f/u with ortho.  She knows to return if worse.  Final Clinical Impressions(s) / ED Diagnoses   Final diagnoses:  Knee effusion, right  Fall, initial encounter    New Prescriptions New Prescriptions   HYDROCODONE-ACETAMINOPHEN (NORCO/VICODIN) 5-325 MG TABLET    Take 1 tablet by mouth every 4 (four) hours as needed.   I personally performed the services described in this documentation, which was scribed in my presence. The recorded information has been reviewed and is accurate.     Isla Pence, MD 02/23/16 [phone]

## 2016-02-23 NOTE — ED Triage Notes (Addendum)
Pt reports she was cleaning yesterday, slipped on some wax and landed on her right leg/knee. Reports swelling and pain that increased over night. Is able to bear weight on leg. Pt also reports right shoulder pain.

## 2016-02-23 NOTE — ED Notes (Signed)
Ortho tech paged  

## 2016-08-22 ENCOUNTER — Telehealth: Payer: Self-pay | Admitting: Cardiology

## 2016-08-22 NOTE — Telephone Encounter (Signed)
Received records from [facility] and [facility] for appointment on 09/19/16 with Dr Ellyn Hack.  Records put with Dr Allison Quarry schedule for 09/19/16. lp

## 2016-09-19 ENCOUNTER — Ambulatory Visit: Payer: Medicare HMO | Admitting: Cardiology

## 2016-09-19 ENCOUNTER — Encounter: Payer: Self-pay | Admitting: *Deleted

## 2017-01-10 ENCOUNTER — Emergency Department (HOSPITAL_COMMUNITY): Payer: Medicare HMO

## 2017-01-10 ENCOUNTER — Emergency Department (HOSPITAL_COMMUNITY)
Admission: EM | Admit: 2017-01-10 | Discharge: 2017-01-10 | Disposition: A | Discharge status: Home / Self Care | Payer: Medicare HMO | Attending: Emergency Medicine | Admitting: Emergency Medicine

## 2017-01-10 ENCOUNTER — Encounter (HOSPITAL_COMMUNITY): Payer: Self-pay

## 2017-01-10 DIAGNOSIS — Y929 Unspecified place or not applicable: Secondary | ICD-10-CM | POA: Insufficient documentation

## 2017-01-10 DIAGNOSIS — Z7722 Contact with and (suspected) exposure to environmental tobacco smoke (acute) (chronic): Secondary | ICD-10-CM | POA: Insufficient documentation

## 2017-01-10 DIAGNOSIS — Z79899 Other long term (current) drug therapy: Secondary | ICD-10-CM | POA: Diagnosis not present

## 2017-01-10 DIAGNOSIS — W19XXXA Unspecified fall, initial encounter: Secondary | ICD-10-CM

## 2017-01-10 DIAGNOSIS — Y9301 Activity, walking, marching and hiking: Secondary | ICD-10-CM | POA: Insufficient documentation

## 2017-01-10 DIAGNOSIS — I110 Hypertensive heart disease with heart failure: Secondary | ICD-10-CM | POA: Insufficient documentation

## 2017-01-10 DIAGNOSIS — M25551 Pain in right hip: Secondary | ICD-10-CM | POA: Diagnosis not present

## 2017-01-10 DIAGNOSIS — W0110XA Fall on same level from slipping, tripping and stumbling with subsequent striking against unspecified object, initial encounter: Secondary | ICD-10-CM | POA: Diagnosis not present

## 2017-01-10 DIAGNOSIS — I5043 Acute on chronic combined systolic (congestive) and diastolic (congestive) heart failure: Secondary | ICD-10-CM | POA: Insufficient documentation

## 2017-01-10 DIAGNOSIS — Z7982 Long term (current) use of aspirin: Secondary | ICD-10-CM | POA: Diagnosis not present

## 2017-01-10 DIAGNOSIS — Y999 Unspecified external cause status: Secondary | ICD-10-CM | POA: Diagnosis not present

## 2017-01-10 MED ORDER — HYDROCODONE-ACETAMINOPHEN 5-325 MG PO TABS
2.0000 | ORAL_TABLET | Freq: Once | ORAL | Status: AC
  Administered 2017-01-10: 2 via ORAL
  Filled 2017-01-10: qty 2

## 2017-01-10 MED ORDER — HYDROCODONE-ACETAMINOPHEN 5-325 MG PO TABS: 1.0000 | ORAL_TABLET | ORAL | 0 refills | Status: DC | PRN

## 2017-01-10 NOTE — Discharge Instructions (Signed)
Please see the information and instructions below regarding your visit.  Your diagnoses today include:  1. Fall, initial encounter   2. Pain of right hip joint    Your x-rays are reassuring today.  There is no evidence of fracture.  We would like you to follow-up if you are still having symptoms next week.  Please do weightbearing as tolerated on your right hip.  Tests performed today include: See side panel of your discharge paperwork for testing performed today. Vital signs are listed at the bottom of these instructions.   X-ray of your right hip.  X-ray of your cervical spine (neck).  Medications prescribed:    Take any prescribed medications only as prescribed, and any over the counter medications only as directed on the packaging.  You have been prescribed Norco for pain. This is an opioid pain medication. You may take this medication every 4-6 hours as needed for pain. Only take this medication if you need it for breakthrough pain.   Do not combine this medication with Tylenol, as it may increase the risk of liver problems.  Do not combine this medication with alcohol.  Please be advised to avoid driving or operating heavy machinery while taking this medication, as it may make you drowsy or impair judgment.    Home care instructions:  Please follow any educational materials contained in this packet.   Follow-up instructions: Please follow-up with your primary care provider in next week for further evaluation of your symptoms if they are not completely improved.   Please follow up with Orthopedics next week for reevaluation.  Return instructions:  Please return to the Emergency Department if you experience worsening symptoms.  Please return to the emergency department if you have any worsening pain, inability to move your hip, increasing pain in your neck, numbness or weakness in your extremities, difficulty with concentrating, difficulty speaking, or difficulty with  mentation. Please return if you have any other emergent concerns.  Additional Information:   Your vital signs today were: BP (!) 145/76 (BP Location: Right Arm)    Pulse 90    Temp 98 F (36.7 C) (Oral)    Resp 18    SpO2 100%  If your blood pressure (BP) was elevated on multiple readings during this visit above 130 for the top number or above 80 for the bottom number, please have this repeated by your primary care provider within one month. --------------  Thank you for allowing [place] to participate in your care today.

## 2017-01-10 NOTE — ED Triage Notes (Signed)
Pt slipped and fell while mopping floor and landed on right hip and thigh. Did not hit head. Denies LOC. Ambulatory to triage

## 2017-01-10 NOTE — ED Provider Notes (Signed)
[facility] EMERGENCY DEPARTMENT Provider Note   CSN: [id number] Arrival date & time: 01/10/17  1605     History   Chief Complaint Chief Complaint  Patient presents with  . Fall    HPI Deborah Holmes is a 67 y.o. female.  HPI   Patient is a 67 year old female with a history of CHF and hypertension presenting for a fall that occurred at 0730 this morning.  Patient is presenting 12 hours after the incident.  Patient does take 81 mg of aspirin daily for her heart disease.  Patient reports she was mopping the floor and slipped.  Subsequently, she fell onto her right side.  Patient did bump the frontal part of her head on a stereo.  Patient did not lose consciousness.  Patient denies any visual changes, distal paresthesias, numbness, weakness, persistent vomiting.  Patient does have some neck pain.  Patient reports it is mostly on the right side of her neck.  Patient reports she is a symptom medic of headache at this time.  Patient did not take any remedies or apply ice prior to arrival.   Past Medical History:  Diagnosis Date  . CHF (congestive heart failure) ([facility])   . Hypertension     Patient Active Problem List   Diagnosis Date Noted  . Anemia 12/03/2014  . CHF exacerbation ([place]) 12/03/2014  . Hypertension 12/03/2014  . Acute on chronic combined systolic and diastolic heart failure [facility])     Past Surgical History:  Procedure Laterality Date  . ROTATOR CUFF REPAIR      OB History    No data available       Home Medications    Prior to Admission medications   Medication Sig Start Date End Date Taking? Authorizing Provider  aspirin EC 81 MG tablet Take 81 mg by mouth daily.    [provider]  carvedilol (COREG) 12.5 MG tablet Take 1 tablet (12.5 mg total) by mouth 2 (two) times daily with a meal. 12/05/14   Short, Noah Delaine, MD  Chlorphen-Pseudoephed-APAP [facility] FLU/COLD PO) Take 1 packet by mouth every 8 (eight) hours as needed (cold  symptoms).    [provider]  furosemide (LASIX) 20 MG tablet Take 1 tablet (20 mg total) by mouth daily. 12/05/14   Janece Canterbury, MD  HYDROcodone-acetaminophen (NORCO/VICODIN) 5-325 MG tablet Take 1 tablet by mouth every 4 (four) hours as needed. 01/10/17   Langston Masker B, PA-C  losartan (COZAAR) 25 MG tablet Take 1 tablet (25 mg total) by mouth daily. 12/05/14   Janece Canterbury, MD  Menthol, Topical Analgesic, (BENGAY EX) Apply 1 application topically daily as needed (pain).    [provider]  Multiple Vitamins-Minerals (AIRBORNE PO) Take 1 tablet by mouth every 4 (four) hours as needed (immune support).    [provider]  vitamin B-12 1000 MCG tablet Take 1 tablet (1,000 mcg total) by mouth daily. 12/05/14   Janece Canterbury, MD    Family History Family History  Problem Relation Age of Onset  . CAD Mother   . Sickle cell anemia Mother   . CAD Father   . Diabetes type II Father     Social History Social History   Tobacco Use  . Smoking status: Passive Smoke Exposure - Never Smoker  . Smokeless tobacco: Never Used  Substance Use Topics  . Alcohol use: No  . Drug use: No     Allergies   Patient has no known allergies.   Review of  Systems Review of Systems  Eyes: Negative for visual disturbance.  Cardiovascular: Negative for chest pain.  Gastrointestinal: Negative for vomiting.  Musculoskeletal: Positive for arthralgias and neck pain.  Skin: Negative for wound.  Neurological: Negative for headaches.  Psychiatric/Behavioral: Negative for confusion.     Physical Exam Updated Vital Signs BP 127/72   Pulse 84   Temp 98 F (36.7 C)   Resp 18   SpO2 99%   Physical Exam  Constitutional: She appears well-developed and well-nourished. No distress.  Sitting comfortably in bed.  HENT:  Head: Normocephalic and atraumatic.  Eyes: Conjunctivae are normal. Right eye exhibits no discharge. Left eye exhibits no discharge.  EOMs normal to  gross examination.  Neck: Normal range of motion.  Cardiovascular: Normal rate and regular rhythm.  Intact, 2+ radial pulse.  Pulmonary/Chest: Effort normal and breath sounds normal.  Normal respiratory effort. Patient converses comfortably. No audible wheeze or stridor.  Abdominal: She exhibits no distension.  Musculoskeletal: Normal range of motion.  No edema, ecchymosis, or deformity of bilateral hips.  Patient exhibits full range of motion with abduction, adduction, flexion, extension, and internal and external rotation of the right hip. Patient has tenderness over the right ischial tuberosity.  No crepitus.  No step-off.  Compartments of the right lower extremity are soft.  No midline tenderness to palpation of cervical, thoracic, or lumbar spine.  No crepitus.  No step-off.  There is right-sided cervical paraspinal muscular tenderness to palpation.  Neurological: She is alert.  Mental Status:  Alert, oriented, thought content appropriate, able to give a coherent history. Speech fluent without evidence of aphasia. Able to follow 2 step commands without difficulty.  Cranial Nerves:  II:  Peripheral visual fields grossly normal, pupils equal, round, reactive to light III,IV, VI: ptosis not present, extra-ocular motions intact bilaterally  V,VII: smile symmetric, facial light touch sensation equal VIII: hearing grossly normal to voice  X: uvula elevates symmetrically  XI: bilateral shoulder shrug symmetric and strong XII: midline tongue extension without fassiculations Motor:  Normal tone. 5/5 in upper and lower extremities bilaterally including strong and equal grip strength and dorsiflexion/plantar flexion Sensory: Pinprick and light touch normal in all extremities.  Deep Tendon Reflexes: 2+ and symmetric in the biceps and patella. Cerebellar: normal finger-to-nose with bilateral upper extremities Gait: normal gait and balance Stance: No pronator drift and good coordination, strength,  and position sense with tapping of bilateral arms (performed in sitting position). CV: distal pulses palpable throughout   Skin: Skin is warm and dry. She is not diaphoretic.  Psychiatric: She has a normal mood and affect. Her behavior is normal. Judgment and thought content normal.  Nursing note and vitals reviewed.    ED Treatments / Results  Labs (all labs ordered are listed, but only abnormal results are displayed) Labs Reviewed - No data to display  EKG  EKG Interpretation None       Radiology Dg Cervical Spine Complete  Result Date: 01/10/2017 CLINICAL DATA:  Fall with neck pain.  Initial encounter. EXAM: CERVICAL SPINE - COMPLETE 4+ VIEW COMPARISON:  None. FINDINGS: No fracture, subluxation or prevertebral soft tissue swelling is identified. Advanced degenerative disc disease present at C4-5, C5-6 and C6-7. Milder disc disease at C3-4 and C7-T1. Multiple bilateral bony foraminal stenosis on oblique views, left greater than right. No bony lesions. IMPRESSION: No acute injury identified. Advanced degenerative disc disease is present of the cervical spine. Electronically Signed   By: Aletta Edouard M.D.   On: 01/10/2017  20:56   Dg Hip Unilat W Or Wo Pelvis 2-3 Views Right  Result Date: 01/10/2017 CLINICAL DATA:  Fall with right hip pain. EXAM: DG HIP (WITH OR WITHOUT PELVIS) 2-3V RIGHT COMPARISON:  02/23/2016 FINDINGS: No acute fracture or dislocation. Mild proliferative disease of the acetabulum is stable. No bony lesions or destruction. The bony pelvis is intact without evidence of fracture or diastasis. IMPRESSION: No acute fracture identified. Mild degenerative disease of the acetabulum is stable. Electronically Signed   By: Aletta Edouard M.D.   On: 01/10/2017 17:53    Procedures Procedures (including critical care time)  Medications Ordered in ED Medications  HYDROcodone-acetaminophen (NORCO/VICODIN) 5-325 MG per tablet 2 tablet (2 tablets Oral Given 01/10/17 2109)      Initial Impression / Assessment and Plan / ED Course  I have reviewed the triage vital signs and the nursing notes.  Pertinent labs & imaging results that were available during my care of the patient were reviewed by me and considered in my medical decision making (see chart for details).  Clinical Course as of Jan 12 227  Thu Jan 10, 2017  2109 I have reviewed the patient's information in the [facility] for the past 12 months and found them to have Rx 5 months ago.  Opiates were prescribed for an acute, painful condition. The patient was given information on side effects and encouraged to use other, non-opiate pain medication primary, only using opiate medicine sparingly for severe pain.  [AM]    Clinical Course User Index [AM] Albesa Seen, PA-C    Final Clinical Impressions(s) / ED Diagnoses   Final diagnoses:  Fall, initial encounter  Pain of right hip joint   Patient is nontoxic-appearing and in no acute distress.  Patient is asymptomatic of headache at this time.  Given that patient is observed 12+ hours after her injury and is neurologically intact, and patient had a minor head injury, do not suspect intracranial hemorrhage at this time.  This was discussed with Dr. Nat Christen.  Radiography of the cervical spine without acute bony abnormality.  Patient does have degenerative changes of the cervical spine.  Right hip x-rays without acute abnormality.  This is likely a contusion injury.  Patient was ambulated in the emergency department  with a cane.  Pain with ambulation, but otherwise well-appearing while ambulating.  Discussed return precautions for any weakness, numbness, or increasing pain in her extremities.  Patient was looked up in the [facility] and found to have no controlled substance prescriptions in the last 5 months.  Rx for Norco # 8.  Patient and her husband are in understanding and agree with the plan of care.  This is a  shared visit with Dr. Nat Christen. Patient was independently evaluated by this attending physician. Attending physician consulted in evaluation and discharge management.  ED Discharge Orders        Ordered    HYDROcodone-acetaminophen (NORCO/VICODIN) 5-325 MG tablet  Every 4 hours PRN     01/10/17 2113       Albesa Seen, PA-C 01/11/17 [id number]    Nat Christen, MD 01/11/17 1538

## 2017-01-10 NOTE — ED Notes (Signed)
Patient transported to XRAY 

## 2019-01-07 ENCOUNTER — Emergency Department (HOSPITAL_COMMUNITY): Payer: Medicare HMO

## 2019-01-07 ENCOUNTER — Other Ambulatory Visit: Payer: Self-pay

## 2019-01-07 ENCOUNTER — Inpatient Hospital Stay (HOSPITAL_COMMUNITY)
Admission: EM | Admit: 2019-01-07 | Discharge: 2019-01-12 | DRG: 177 | Disposition: A | Discharge status: Home Health | Payer: Medicare HMO | Attending: Internal Medicine | Admitting: Internal Medicine

## 2019-01-07 DIAGNOSIS — U071 COVID-19: Secondary | ICD-10-CM | POA: Diagnosis present

## 2019-01-07 DIAGNOSIS — D649 Anemia, unspecified: Secondary | ICD-10-CM | POA: Diagnosis present

## 2019-01-07 DIAGNOSIS — Z791 Long term (current) use of non-steroidal anti-inflammatories (NSAID): Secondary | ICD-10-CM | POA: Diagnosis not present

## 2019-01-07 DIAGNOSIS — E66813 Obesity, class 3: Secondary | ICD-10-CM | POA: Diagnosis present

## 2019-01-07 DIAGNOSIS — J1282 Pneumonia due to coronavirus disease 2019: Secondary | ICD-10-CM | POA: Diagnosis present

## 2019-01-07 DIAGNOSIS — I110 Hypertensive heart disease with heart failure: Secondary | ICD-10-CM | POA: Diagnosis present

## 2019-01-07 DIAGNOSIS — Z8249 Family history of ischemic heart disease and other diseases of the circulatory system: Secondary | ICD-10-CM

## 2019-01-07 DIAGNOSIS — D638 Anemia in other chronic diseases classified elsewhere: Secondary | ICD-10-CM

## 2019-01-07 DIAGNOSIS — J9601 Acute respiratory failure with hypoxia: Secondary | ICD-10-CM | POA: Diagnosis present

## 2019-01-07 DIAGNOSIS — Z20822 Contact with and (suspected) exposure to covid-19: Secondary | ICD-10-CM | POA: Diagnosis not present

## 2019-01-07 DIAGNOSIS — I10 Essential (primary) hypertension: Secondary | ICD-10-CM | POA: Diagnosis present

## 2019-01-07 DIAGNOSIS — Z79899 Other long term (current) drug therapy: Secondary | ICD-10-CM | POA: Diagnosis not present

## 2019-01-07 DIAGNOSIS — Z66 Do not resuscitate: Secondary | ICD-10-CM | POA: Diagnosis present

## 2019-01-07 DIAGNOSIS — I5042 Chronic combined systolic (congestive) and diastolic (congestive) heart failure: Secondary | ICD-10-CM | POA: Diagnosis present

## 2019-01-07 DIAGNOSIS — Z23 Encounter for immunization: Secondary | ICD-10-CM

## 2019-01-07 DIAGNOSIS — Z6841 Body Mass Index (BMI) 40.0 and over, adult: Secondary | ICD-10-CM

## 2019-01-07 DIAGNOSIS — Z7982 Long term (current) use of aspirin: Secondary | ICD-10-CM

## 2019-01-07 DIAGNOSIS — R0602 Shortness of breath: Secondary | ICD-10-CM | POA: Diagnosis not present

## 2019-01-07 DIAGNOSIS — J189 Pneumonia, unspecified organism: Secondary | ICD-10-CM | POA: Diagnosis not present

## 2019-01-07 DIAGNOSIS — J9600 Acute respiratory failure, unspecified whether with hypoxia or hypercapnia: Secondary | ICD-10-CM | POA: Diagnosis present

## 2019-01-07 LAB — COMPREHENSIVE METABOLIC PANEL
ALT: 18 U/L (ref 0–44)
AST: 32 U/L (ref 15–41)
Albumin: 3.5 g/dL (ref 3.5–5.0)
Alkaline Phosphatase: 54 U/L (ref 38–126)
Anion gap: 11 (ref 5–15)
BUN: 15 mg/dL (ref 8–23)
CO2: 27 mmol/L (ref 22–32)
Calcium: 9 mg/dL (ref 8.9–10.3)
Chloride: 107 mmol/L (ref 98–111)
Creatinine, Ser: 1.06 mg/dL — ABNORMAL HIGH (ref 0.44–1.00)
GFR calc Af Amer: >60 mL/min (ref >60)
GFR calc non Af Amer: 54 mL/min — ABNORMAL LOW (ref >60)
Glucose, Bld: 132 mg/dL — ABNORMAL HIGH (ref 70–99)
Potassium: 3.6 mmol/L (ref 3.5–5.1)
Sodium: 145 mmol/L (ref 135–145)
Total Bilirubin: 1 mg/dL (ref 0.3–1.2)
Total Protein: 8.1 g/dL (ref 6.5–8.1)

## 2019-01-07 LAB — CBC WITH DIFFERENTIAL/PLATELET
Abs Immature Granulocytes: 0.04 10*3/uL (ref 0.00–0.07)
Basophils Absolute: 0 10*3/uL (ref 0.0–0.1)
Basophils Relative: 0 %
Eosinophils Absolute: 0 10*3/uL (ref 0.0–0.5)
Eosinophils Relative: 0 %
HCT: 34 % — ABNORMAL LOW (ref 36.0–46.0)
Hemoglobin: 10.7 g/dL — ABNORMAL LOW (ref 12.0–15.0)
Immature Granulocytes: 1 %
Lymphocytes Relative: 13 %
Lymphs Abs: 0.9 10*3/uL (ref 0.7–4.0)
MCH: 27.9 pg (ref 26.0–34.0)
MCHC: 31.5 g/dL (ref 30.0–36.0)
MCV: 88.8 fL (ref 80.0–100.0)
Monocytes Absolute: 0.3 10*3/uL (ref 0.1–1.0)
Monocytes Relative: 4 %
Neutro Abs: 5.9 10*3/uL (ref 1.7–7.7)
Neutrophils Relative %: 82 %
Platelets: 205 10*3/uL (ref 150–400)
RBC: 3.83 MIL/uL — ABNORMAL LOW (ref 3.87–5.11)
RDW: 13.2 % (ref 11.5–15.5)
WBC: 7.1 10*3/uL (ref 4.0–10.5)
nRBC: 0 % (ref 0.0–0.2)

## 2019-01-07 LAB — LACTIC ACID, PLASMA
Lactic Acid, Venous: 1.1 mmol/L (ref 0.5–1.9)
Lactic Acid, Venous: 0.9 mmol/L (ref 0.5–1.9)

## 2019-01-07 LAB — D-DIMER, QUANTITATIVE: D-Dimer, Quant: 1.23 ug/mL-FEU — ABNORMAL HIGH (ref 0.00–0.50)

## 2019-01-07 LAB — C-REACTIVE PROTEIN: CRP: 16.1 mg/dL — ABNORMAL HIGH (ref <1.0)

## 2019-01-07 LAB — PROCALCITONIN: Procalcitonin: <0.1 ng/mL

## 2019-01-07 LAB — FERRITIN: Ferritin: 643 ng/mL — ABNORMAL HIGH (ref 11–307)

## 2019-01-07 LAB — PROTIME-INR
INR: 1 (ref 0.8–1.2)
Prothrombin Time: 13.3 seconds (ref 11.4–15.2)

## 2019-01-07 LAB — LACTATE DEHYDROGENASE: LDH: 407 U/L — ABNORMAL HIGH (ref 98–192)

## 2019-01-07 LAB — TRIGLYCERIDES: Triglycerides: 89 mg/dL (ref <150)

## 2019-01-07 LAB — TROPONIN I (HIGH SENSITIVITY): Troponin I (High Sensitivity): 17 ng/L (ref <18)

## 2019-01-07 LAB — FIBRINOGEN: Fibrinogen: 665 mg/dL — ABNORMAL HIGH (ref 210–475)

## 2019-01-07 MED ORDER — GUAIFENESIN-DM 100-10 MG/5ML PO SYRP
10.0000 mL | ORAL_SOLUTION | ORAL | Status: DC | PRN
  Administered 2019-01-08 – 2019-01-11 (×5): 10 mL via ORAL
  Filled 2019-01-07 (×5): qty 10

## 2019-01-07 MED ORDER — ONDANSETRON HCL 4 MG PO TABS: 4.0000 mg | ORAL_TABLET | Freq: Four times a day (QID) | ORAL | Status: DC | PRN

## 2019-01-07 MED ORDER — AZITHROMYCIN 250 MG PO TABS
500.0000 mg | ORAL_TABLET | Freq: Once | ORAL | Status: AC
  Administered 2019-01-07: 500 mg via ORAL
  Filled 2019-01-07: qty 2

## 2019-01-07 MED ORDER — AMLODIPINE BESYLATE 5 MG PO TABS
5.0000 mg | ORAL_TABLET | Freq: Every day | ORAL | Status: DC
  Administered 2019-01-08 – 2019-01-12 (×6): 5 mg via ORAL
  Filled 2019-01-07 (×6): qty 1

## 2019-01-07 MED ORDER — HYDROCODONE-ACETAMINOPHEN 5-325 MG PO TABS: 1.0000 | ORAL_TABLET | ORAL | Status: DC | PRN

## 2019-01-07 MED ORDER — SODIUM CHLORIDE 0.9 % IV SOLN: 250.0000 mL | INTRAVENOUS | Status: DC | PRN

## 2019-01-07 MED ORDER — DEXAMETHASONE SODIUM PHOSPHATE 10 MG/ML IJ SOLN
6.0000 mg | INTRAMUSCULAR | Status: DC
  Administered 2019-01-08 – 2019-01-12 (×5): 6 mg via INTRAVENOUS
  Filled 2019-01-07 (×5): qty 1

## 2019-01-07 MED ORDER — LOSARTAN POTASSIUM 50 MG PO TABS
100.0000 mg | ORAL_TABLET | Freq: Every day | ORAL | Status: DC
  Administered 2019-01-08 – 2019-01-12 (×5): 100 mg via ORAL
  Filled 2019-01-07 (×6): qty 2

## 2019-01-07 MED ORDER — SODIUM CHLORIDE 0.9% FLUSH
3.0000 mL | Freq: Two times a day (BID) | INTRAVENOUS | Status: DC
  Administered 2019-01-08 – 2019-01-12 (×6): 3 mL via INTRAVENOUS

## 2019-01-07 MED ORDER — DEXAMETHASONE SODIUM PHOSPHATE 10 MG/ML IJ SOLN
6.0000 mg | Freq: Once | INTRAMUSCULAR | Status: AC
  Administered 2019-01-07: 6 mg via INTRAVENOUS
  Filled 2019-01-07: qty 1

## 2019-01-07 MED ORDER — SODIUM CHLORIDE 0.9% FLUSH
3.0000 mL | Freq: Two times a day (BID) | INTRAVENOUS | Status: DC
  Administered 2019-01-08 – 2019-01-11 (×8): 3 mL via INTRAVENOUS

## 2019-01-07 MED ORDER — ENOXAPARIN SODIUM 60 MG/0.6ML ~~LOC~~ SOLN
60.0000 mg | SUBCUTANEOUS | Status: DC
  Administered 2019-01-08 – 2019-01-11 (×5): 60 mg via SUBCUTANEOUS
  Filled 2019-01-07 (×5): qty 0.6

## 2019-01-07 MED ORDER — CARVEDILOL 25 MG PO TABS
25.0000 mg | ORAL_TABLET | Freq: Two times a day (BID) | ORAL | Status: DC
  Administered 2019-01-08 – 2019-01-12 (×10): 25 mg via ORAL
  Filled 2019-01-07 (×11): qty 1

## 2019-01-07 MED ORDER — ACETAMINOPHEN 325 MG PO TABS: 650.0000 mg | ORAL_TABLET | Freq: Four times a day (QID) | ORAL | Status: DC | PRN

## 2019-01-07 MED ORDER — ONDANSETRON HCL 4 MG/2ML IJ SOLN: 4.0000 mg | Freq: Four times a day (QID) | INTRAMUSCULAR | Status: DC | PRN

## 2019-01-07 MED ORDER — POTASSIUM CHLORIDE CRYS ER 20 MEQ PO TBCR
20.0000 meq | EXTENDED_RELEASE_TABLET | Freq: Once | ORAL | Status: AC
  Administered 2019-01-07: 20 meq via ORAL
  Filled 2019-01-07: qty 1

## 2019-01-07 MED ORDER — ASPIRIN EC 81 MG PO TBEC
81.0000 mg | DELAYED_RELEASE_TABLET | Freq: Every day | ORAL | Status: DC
  Administered 2019-01-08 – 2019-01-12 (×6): 81 mg via ORAL
  Filled 2019-01-07 (×6): qty 1

## 2019-01-07 MED ORDER — SODIUM CHLORIDE 0.9% FLUSH: 3.0000 mL | INTRAVENOUS | Status: DC | PRN

## 2019-01-07 MED ORDER — SODIUM CHLORIDE 0.9 % IV SOLN
1.0000 g | Freq: Once | INTRAVENOUS | Status: AC
  Administered 2019-01-07: 1 g via INTRAVENOUS
  Filled 2019-01-07: qty 10

## 2019-01-07 NOTE — H&P (Signed)
Deborah Holmes [facility] [id number] DOB: [birth year] DOA: 01/07/2019     PCP: Patient, No Pcp Per   Outpatient Specialists:  CARDS:  Gretta Arab, MD     Patient arrived to ER on 01/07/19 at 1527  Patient coming from: home Lives  With family    Chief Complaint:  Chief Complaint  Patient presents with  . Cough  . Dizziness  . Shortness of Breath    HPI: Deborah Holmes is a 69 y.o. female with medical history significant of HTN, chronic combined systolic diastolic CHF, ANEMIA    Presented with   cold-like symptoms since 28 December complains of cough lightheadedness fevers she cannot taste or smell food.  Endorses nausea vomiting diarrhea Noted to be satting 85% on room air started on 2 L of O2 improved to oxygen saturation durations up to 92 Temperature 100.7   Her Husband has been sick too. They have been isolating apart from seeing her family  She was exposed to her sister on thanksgiving who tested positive for Cvoid Have been compliant with mask wearing    Infectious risk factors:  Reports  fever, shortness of breath, dry cough,  URI symptoms, anosmia/change in taste, N/V/Diarrhea/abdominal pain,     In  ER RAPID COVID TEST      Pending  in house  PCR testing  Pending  No results found for: SARSCOV2NAA   Regarding pertinent Chronic problems:      HTN on Coreg, Cozaar, norvasc   chronic CHF diastolic/systolic/ combined - last echo12/04/2014 LV EF: 25% -   [id number] (grade 1 diastolic dysfunction). On Lasix   Morbid obesity-   BMI Readings from Last 1 Encounters:  01/07/19 41.16 kg/m     While in ER: Noted to be hypoxic down to 85% started on 2 L improved up to  92% Presumed to have Covid Showing typical multifocal airspace opacities inflammatory markers elevated markers elevated Patient initially was given a dose of Rocephin/azithro  The following Work up has been ordered so far:  Orders Placed This Encounter  Procedures  . Culture,  blood (Routine x 2)  . SARS CORONAVIRUS 2 (TAT 6-24 HRS) Nasopharyngeal Nasopharyngeal Swab  . Respiratory Panel by RT PCR (Flu A&B, Covid) - Nasopharyngeal Swab  . DG Chest Portable 1 View  . Comprehensive metabolic panel  . Lactic acid, plasma  . CBC with Differential  . Protime-INR  . Urinalysis, Routine w reflex microscopic  . D-dimer, quantitative  . Procalcitonin  . Lactate dehydrogenase  . Ferritin  . Triglycerides  . Fibrinogen  . C-reactive protein  . Diet NPO time specified  . Notify Physician if pt is possible Sepsis patient  . Document height and weight  . Insert / maintain saline lock  . Cardiac monitoring  . Insert peripheral IV x 2  . Initiate Carrier Fluid Protocol  . Place surgical mask on patient  . Patient to wear surgical mask during transportation  . Assess patient for ability to self-prone. If able (can move self in bed, ambulate) and stable (SpO2 and oxygen requirement):  . RN/NT - Document specific oxygen requirements in CHL  . Notify EDP if new oxygen requirements escalates > 4L per minute [place]  . RN to draw the following extra tubes:  . Consult to hospitalist  ALL PATIENTS BEING ADMITTED/HAVING PROCEDURES NEED COVID-19 SCREENING  . Airborne and Contact precautions  . Pulse oximetry, continuous  . EKG 12-Lead  . ED EKG 12-Lead  . Saline lock IV  Following Medications were ordered in ER: Medications  dexamethasone (DECADRON) injection 6 mg (has no administration in time range)  cefTRIAXone (ROCEPHIN) 1 g in sodium chloride 0.9 % 100 mL IVPB (has no administration in time range)  azithromycin (ZITHROMAX) tablet 500 mg (has no administration in time range)        Consult Orders  (From admission, onward)         Start     Ordered   01/07/19 1956  Consult to hospitalist  ALL PATIENTS BEING ADMITTED/HAVING PROCEDURES NEED COVID-19 SCREENING  Once    Comments: ALL PATIENTS BEING ADMITTED/HAVING PROCEDURES NEED COVID-19 SCREENING  Provider:   (Not yet assigned)  Question Answer Comment  Place call to: [facility]   Reason for Consult Admit      01/07/19 1955           Significant initial  Findings: Abnormal Labs Reviewed  COMPREHENSIVE METABOLIC PANEL - Abnormal; Notable for the following components:      Result Value   Glucose, Bld 132 (*)    Creatinine, Ser 1.06 (*)    GFR calc non Af Amer 54 (*)    All other components within normal limits  CBC WITH DIFFERENTIAL/PLATELET - Abnormal; Notable for the following components:   RBC 3.83 (*)    Hemoglobin 10.7 (*)    HCT 34.0 (*)    All other components within normal limits  D-DIMER, QUANTITATIVE (NOT AT [facility]) - Abnormal; Notable for the following components:   D-Dimer, Quant 1.23 (*)    All other components within normal limits  LACTATE DEHYDROGENASE - Abnormal; Notable for the following components:   LDH 407 (*)    All other components within normal limits  FERRITIN - Abnormal; Notable for the following components:   Ferritin 643 (*)    All other components within normal limits  FIBRINOGEN - Abnormal; Notable for the following components:   Fibrinogen 665 (*)    All other components within normal limits  C-REACTIVE PROTEIN - Abnormal; Notable for the following components:   CRP 16.1 (*)    All other components within normal limits     Otherwise labs showing:    Recent Labs  Lab 01/07/19 1709  NA 145  K 3.6  CO2 27  GLUCOSE 132*  BUN 15  CREATININE 1.06*  CALCIUM 9.0    Cr   stable,   Lab Results  Component Value Date   CREATININE 1.06 (H) 01/07/2019   CREATININE 0.91 01/06/2015   CREATININE 1.03 (H) 12/05/2014    Recent Labs  Lab 01/07/19 1709  AST 32  ALT 18  ALKPHOS 54  BILITOT 1.0  PROT 8.1  ALBUMIN 3.5   Lab Results  Component Value Date   CALCIUM 9.0 01/07/2019      WBC      Component Value Date/Time   WBC 7.1 01/07/2019 1709   ANC    Component Value Date/Time   NEUTROABS 5.9 01/07/2019 1709    ALC 0.9  Plt: Lab Results  Component Value Date   PLT 205 01/07/2019    Lactic Acid, Venous    Component Value Date/Time   LATICACIDVEN 1.1 01/07/2019 1709    Procalcitonin <0.1   COVID-19 Labs  Recent Labs    01/07/19 1709  DDIMER 1.23*  FERRITIN 643*  LDH 407*  CRP 16.1*    No results found for: SARSCOV2NAA   HG/HCT   stable,       Component Value Date/Time   HGB 10.7 (  L) 01/07/2019 1709   HGB 11.1 (L) 01/16/2011 2000   HCT 34.0 (L) 01/07/2019 1709   HCT 33.2 (L) 01/16/2011 2000    No results for input(s): LIPASE, AMYLASE in the last 168 hours. No results for input(s): AMMONIA in the last 168 hours.  No components found for: LABALBU   Troponin  ordered     ECG: Ordered Personally reviewed by me showing: HR : 94 Rhythm:  NSR,   nonspecific changes,   QTC 452     UA not ordered      Ordered   CXR -bilateral infiltrates     ED Triage Vitals  Enc Vitals Group     BP 01/07/19 1556 (!) 130/57     Pulse Rate 01/07/19 1556 95     Resp 01/07/19 1556 (!) 29     Temp 01/07/19 1556 (!) 100.7 F (38.2 C)     Temp Source 01/07/19 1556 Oral     SpO2 01/07/19 1555 92 %     Weight 01/07/19 1646 255 lb (115.7 kg)     Height 01/07/19 1557 5\' 6"  (1.676 m)     Head Circumference --      Peak Flow --      Pain Score 01/07/19 1556 10     Pain Loc --      Pain Edu? --      Excl. in [place]? --   TMAX(24)@       Latest  Blood pressure 136/64, pulse 84, temperature (!) 100.7 F (38.2 C), temperature source Oral, resp. rate (!) 28, height 5\' 6"  (1.676 m), weight 115.7 kg, SpO2 96 %.     Hospitalist was called for admission for acute respiratory failure secondary to presumed Covid infection   Review of Systems:    Pertinent positives include:  Fevers, chills,  abdominal pain, nausea, vomiting, diarrhea shortness of breath at rest.  dyspnea on exertion,   non-productive cough, Constitutional:  No weight loss, night sweats, fatigue, weight loss   HEENT:  No headaches, Difficulty swallowing,Tooth/dental problems,Sore throat,  No sneezing, itching, ear ache, nasal congestion, post nasal drip,  Cardio-vascular:  No chest pain, Orthopnea, PND, anasarca, dizziness, palpitations.no Bilateral lower extremity swelling  GI:  No heartburn, indigestion, change in bowel habits, loss of appetite, melena, blood in stool, hematemesis Resp:  no  No coughing up of blood.No change in color of mucus.No wheezing. Skin:  no rash or lesions. No jaundice GU:  no dysuria, change in color of urine, no urgency or frequency. No straining to urinate.  No flank pain.  Musculoskeletal:  No joint pain or no joint swelling. No decreased range of motion. No back pain.  Psych:  No change in mood or affect. No depression or anxiety. No memory loss.  Neuro: no localizing neurological complaints, no tingling, no weakness, no double vision, no gait abnormality, no slurred speech, no confusion  All systems reviewed and apart from [place] all are negative  Past Medical History:   Past Medical History:  Diagnosis Date  . CHF (congestive heart failure) ([place])   . Hypertension       Past Surgical History:  Procedure Laterality Date  . ROTATOR CUFF REPAIR      Social History:  Ambulatory  Independently     reports that she is a non-smoker but has been exposed to tobacco smoke. She has never used smokeless tobacco. She reports that she does not drink alcohol or use drugs.   Family History:   Family History  Problem Relation Age of Onset  . CAD Mother   . Sickle cell anemia Mother   . CAD Father   . Diabetes type II Father     Allergies: No Known Allergies   Prior to Admission medications   Medication Sig Start Date End Date Taking? Authorizing Provider  amLODipine (NORVASC) 5 MG tablet Take 5 mg by mouth daily. 11/06/18  Yes [provider]  aspirin EC 81 MG tablet Take 81 mg by mouth daily.   Yes [provider]  carvedilol  (COREG) 25 MG tablet Take 25 mg by mouth 2 (two) times daily. 11/06/18  Yes [provider]  Chlorphen-Pseudoephed-APAP (THERAFLU FLU/COLD PO) Take 1 packet by mouth every 8 (eight) hours as needed (cold symptoms).   Yes [provider]  furosemide (LASIX) 20 MG tablet Take 1 tablet (20 mg total) by mouth daily. 12/05/14  Yes Short, Noah Delaine, MD  HYDROcodone-acetaminophen (NORCO/VICODIN) 5-325 MG tablet Take 1 tablet by mouth every 4 (four) hours as needed. 01/10/17  Yes Valere Dross, Alyssa B, PA-C  losartan (COZAAR) 100 MG tablet Take 100 mg by mouth daily. 12/16/18  Yes [provider]  meloxicam (MOBIC) 7.5 MG tablet Take 7.5 mg by mouth daily. 11/06/18  Yes [provider]  vitamin B-12 1000 MCG tablet Take 1 tablet (1,000 mcg total) by mouth daily. 12/05/14  Yes Janece Canterbury, MD   Physical Exam: Blood pressure 136/64, pulse 84, temperature (!) 100.7 F (38.2 C), temperature source Oral, resp. rate (!) 28, height 5\' 6"  (1.676 m), weight 115.7 kg, SpO2 96 %. 1. General:  in No  Acute distress    Chronically ill -appearing 2. Psychological: Alert and  Oriented 3. Head/ENT:    Dry Mucous Membranes                          Head Non traumatic, neck supple                            Poor Dentition 4. SKIN:   decreased Skin turgor,  Skin clean Dry and intact no rash 5. Heart: Regular rate and rhythm no  Murmur, no Rub or gallop 6. Lungs:  no wheezes or crackles   7. Abdomen: Soft, non-tender, Non distended  obese  bowel sounds present 8. Lower extremities: no clubbing, cyanosis, no edema 9. Neurologically Grossly intact, moving all 4 extremities equally   10. MSK: Normal range of motion   All other LABS:     Recent Labs  Lab 01/07/19 1709  WBC 7.1  NEUTROABS 5.9  HGB 10.7*  HCT 34.0*  MCV 88.8  PLT 205     Recent Labs  Lab 01/07/19 1709  NA 145  K 3.6  CL 107  CO2 27  GLUCOSE 132*  BUN 15  CREATININE 1.06*  CALCIUM 9.0     Recent Labs   Lab 01/07/19 1709  AST 32  ALT 18  ALKPHOS 54  BILITOT 1.0  PROT 8.1  ALBUMIN 3.5       Cultures: No results found for: SDES, SPECREQUEST, CULT, REPTSTATUS   Radiological Exams on Admission: DG Chest Portable 1 View  Result Date: 01/07/2019 CLINICAL DATA:  Shortness of breath EXAM: PORTABLE CHEST 1 VIEW COMPARISON:  January 06, 2015 FINDINGS: There are scattered bilateral airspace opacities. There is no pneumothorax. No large pleural effusion. The heart size is mildly enlarged. Aortic calcifications are noted. There are  degenerative changes of both glenohumeral joints. IMPRESSION: Multifocal airspace opacities consistent with the patient's history of viral pneumonia. Electronically Signed   By: Constance Holster M.D.   On: 01/07/2019 16:34    Chart has been reviewed    Assessment/Plan  69 y.o. female with medical history significant of HTN, chronic combined systolic diastolic CHF, ANEMIA  Admitted for acute respiratory failure secondary to presumed Covid infection   Present on Admission: . Suspected COVID-19 virus infection -  ER Novel Corona Virus testing:  Ordered 01/07/19 and is  Pending    Following concerning LAB/ imaging findings:      CRP, LDH: increased   IL-6 and Ferritin increased   Procalcitonin: low  CXR: hazy bilateral peripheral opacities       -Following work-up initiated:         sputum cultures  Ordered 01/07/19, Blood cultures Ordered 01/07/19,       Plan of treatment: -If tests positive would transfer to [facility] facility if   bed is available and meats criteria for transfer at this point testing is pending and she is on 2 L but recently increased to 4L will Admit on Airborn Precautions to Current facility   -given severity of illness initiate steroids Decadron 6mg  q 24 hours If tests positive will order pharmacy consult for remdesivir -Given hypoxia and CRP >7 will attempt a trial of Actemra if positive  - Will follow daily d.dimer -  Assess for ability to prone  - Supportive management -Fluid sparing resuscitation  -Provide oxygen as needed currently on   SpO2: 95 % O2 Flow Rate (L/min): 2 L/min - IF d.dimer elvated >5 will increase dose of lovenox - ER Initiated antibiotics given normal procalcitonin will hold off for now     Poor Prognostic factors  69 y.o.  Personal hx of   HTN, obesity        Will order Airborne and Contact precautions  Family/ patient prognosis discussion: I have discussed case with the family/ patient  who are aware of their prognosis At this point patient  would like  to be  DNR/DNI      . Anemia chronic stable continue to monitor  . Hypertension -stable continue home medications  . Chronic combined systolic (congestive) and diastolic (congestive) heart failure (HCC) -current appears to be euvolemic given recent nausea and vomiting perhaps actually slightly on the dry side.  Hold off on Lasix for tonight and monitor  . Obesity, Class III, BMI 40-49.9 (morbid obesity) ([place]) -will need outpatient follow-up in diet management for now this does increase risk of negative outcomes in the setting of Covid  . Acute respiratory failure due to presumed COVID-19 ([place]) -provide oxygen continuous pulse ox measurement   Other plan as per orders.  DVT prophylaxis:   Lovenox     Code Status:   DNR/DNI as per patient    I had personally discussed CODE STATUS with patient   Family Communication:   Family not at  Bedside    Disposition Plan:  To home once workup is complete and patient is stable     Consults called: none   Admission status:  ED Disposition    None        inpatient     Expect 2 midnight stay secondary to severity of patient's current illness including   hemodynamic instability despite optimal treatment ( hypoxia,  )   Severe lab/radiological/exam abnormalities including:   Bilateral pneumonia  and extensive comorbidities including:  CHF .  Morbid Obesity   That  are currently affecting medical management.   I expect  patient to be hospitalized for 2 midnights requiring inpatient medical care.  Patient is at high risk for adverse outcome (such as loss of life or disability) if not treated.  Indication for inpatient stay as follows:    New or worsening hypoxia  Need for IV antibiotics, IV fluids,      Level of care      tele  For 12H    Precautions: admitted as  PUI  Airborne and Contact precautions  If Covid PCR is negative  -  would need additional investigation given very high risk for false native test result   PPE: Used by the provider:   P100  eye Goggles,  Gloves  gown   Deborah Holmes 01/07/2019, 9:09 PM    [facility]     after 2 AM please page floor coverage PA If 7AM-7PM, please contact the day team taking care of the patient using [email or web address]

## 2019-01-07 NOTE — ED Notes (Signed)
2 unsuccessful IV starts by this RN.

## 2019-01-07 NOTE — ED Provider Notes (Signed)
[place] DEPT Provider Note   CSN: [id number] Arrival date & time: 01/07/19  1527     History Chief Complaint  Patient presents with  . Cough  . Dizziness  . Shortness of Breath    Deborah Holmes is a 69 y.o. female.  HPI   Patient presents to the emergency room with complaints of cough and shortness of breath.  Patient states her symptoms started on the 28th.  She has had progression of cough.  She has had fevers and has felt lightheaded.  Patient has noticed that her sense of taste has been affected and food does not taste normal.  She has had some episodes of vomiting and diarrhea.  Patient states her symptoms have progressed so she came to the ED.  Patient is not aware of any definite Covid exposure.  Past Medical History:  Diagnosis Date  . CHF (congestive heart failure) ([place])   . Hypertension     Patient Active Problem List   Diagnosis Date Noted  . Chronic combined systolic (congestive) and diastolic (congestive) heart failure ([place]) 01/07/2019  . Obesity, Class III, BMI 40-49.9 (morbid obesity) ([place]) 01/07/2019  . Suspected COVID-19 virus infection 01/07/2019  . Acute respiratory failure due to COVID-19 ([place]) 01/07/2019  . Anemia 12/03/2014  . CHF exacerbation ([place]) 12/03/2014  . Hypertension 12/03/2014  . Acute on chronic combined systolic and diastolic heart failure [facility])     Past Surgical History:  Procedure Laterality Date  . ROTATOR CUFF REPAIR       OB History   No obstetric history on file.     Family History  Problem Relation Age of Onset  . CAD Mother   . Sickle cell anemia Mother   . CAD Father   . Diabetes type II Father     Social History   Tobacco Use  . Smoking status: Passive Smoke Exposure - Never Smoker  . Smokeless tobacco: Never Used  Substance Use Topics  . Alcohol use: No  . Drug use: No    Home Medications Prior to Admission medications   Medication Sig Start Date End Date Taking?  Authorizing Provider  amLODipine (NORVASC) 5 MG tablet Take 5 mg by mouth daily. 11/06/18  Yes [provider]  aspirin EC 81 MG tablet Take 81 mg by mouth daily.   Yes [provider]  carvedilol (COREG) 25 MG tablet Take 25 mg by mouth 2 (two) times daily. 11/06/18  Yes [provider]  Chlorphen-Pseudoephed-APAP (THERAFLU FLU/COLD PO) Take 1 packet by mouth every 8 (eight) hours as needed (cold symptoms).   Yes [provider]  furosemide (LASIX) 20 MG tablet Take 1 tablet (20 mg total) by mouth daily. 12/05/14  Yes Short, Noah Delaine, MD  HYDROcodone-acetaminophen (NORCO/VICODIN) 5-325 MG tablet Take 1 tablet by mouth every 4 (four) hours as needed. 01/10/17  Yes Valere Dross, Alyssa B, PA-C  losartan (COZAAR) 100 MG tablet Take 100 mg by mouth daily. 12/16/18  Yes [provider]  meloxicam (MOBIC) 7.5 MG tablet Take 7.5 mg by mouth daily. 11/06/18  Yes [provider]  vitamin B-12 1000 MCG tablet Take 1 tablet (1,000 mcg total) by mouth daily. 12/05/14  Yes Janece Canterbury, MD    Allergies    Patient has no known allergies.  Review of Systems   Review of Systems  All other systems reviewed and are negative.   Physical Exam Updated Vital Signs BP 140/69   Pulse 84   Temp (!) 100.7  F (38.2 C) (Oral)   Resp (!) 29   Ht 1.676 m (5\' 6" )   Wt 115.7 kg   SpO2 96%   BMI 41.16 kg/m   Physical Exam Vitals and nursing note reviewed.  Constitutional:      General: She is not in acute distress.    Appearance: She is well-developed.  HENT:     Head: Normocephalic and atraumatic.     Right Ear: External ear normal.     Left Ear: External ear normal.  Eyes:     General: No scleral icterus.       Right eye: No discharge.        Left eye: No discharge.     Conjunctiva/sclera: Conjunctivae normal.  Neck:     Trachea: No tracheal deviation.  Cardiovascular:     Rate and Rhythm: Normal rate and regular rhythm.  Pulmonary:     Effort:  Pulmonary effort is normal. No respiratory distress.     Breath sounds: Normal breath sounds. No stridor. No decreased breath sounds, wheezing, rhonchi or rales.  Abdominal:     General: Bowel sounds are normal. There is no distension.     Palpations: Abdomen is soft.     Tenderness: There is no abdominal tenderness. There is no guarding or rebound.  Musculoskeletal:        General: No tenderness.     Cervical back: Neck supple.  Skin:    General: Skin is warm and dry.     Findings: No rash.  Neurological:     Mental Status: She is alert.     Cranial Nerves: No cranial nerve deficit (no facial droop, extraocular movements intact, no slurred speech).     Sensory: No sensory deficit.     Motor: No abnormal muscle tone or seizure activity.     Coordination: Coordination normal.     ED Results / Procedures / Treatments   Labs (all labs ordered are listed, but only abnormal results are displayed) Labs Reviewed  COMPREHENSIVE METABOLIC PANEL - Abnormal; Notable for the following components:      Result Value   Glucose, Bld 132 (*)    Creatinine, Ser 1.06 (*)    GFR calc non Af Amer 54 (*)    All other components within normal limits  CBC WITH DIFFERENTIAL/PLATELET - Abnormal; Notable for the following components:   RBC 3.83 (*)    Hemoglobin 10.7 (*)    HCT 34.0 (*)    All other components within normal limits  D-DIMER, QUANTITATIVE (NOT AT [facility]) - Abnormal; Notable for the following components:   D-Dimer, Quant 1.23 (*)    All other components within normal limits  LACTATE DEHYDROGENASE - Abnormal; Notable for the following components:   LDH 407 (*)    All other components within normal limits  FERRITIN - Abnormal; Notable for the following components:   Ferritin 643 (*)    All other components within normal limits  FIBRINOGEN - Abnormal; Notable for the following components:   Fibrinogen 665 (*)    All other components within normal limits  C-REACTIVE PROTEIN - Abnormal;  Notable for the following components:   CRP 16.1 (*)    All other components within normal limits  CULTURE, BLOOD (ROUTINE X 2)  CULTURE, BLOOD (ROUTINE X 2)  SARS CORONAVIRUS 2 (TAT 6-24 HRS)  RESPIRATORY PANEL BY RT PCR (FLU A&B, COVID)  LACTIC ACID, PLASMA  PROTIME-INR  PROCALCITONIN  TRIGLYCERIDES  LACTIC ACID, PLASMA  URINALYSIS, ROUTINE W  REFLEX MICROSCOPIC  TROPONIN I (HIGH SENSITIVITY)    EKG EKG Interpretation  Date/Time:  Wednesday January 07 2019 15:56:04 EST Ventricular Rate:  94 PR Interval:    QRS Duration: 101 QT Interval:  361 QTC Calculation: 452 R Axis:   -9 Text Interpretation: Sinus rhythm Abnormal R-wave progression, early transition LVH with secondary repolarization abnormality No significant change since last tracing Confirmed by Dorie Rank [phone]) on 01/07/2019 4:51:04 PM   Radiology DG Chest Portable 1 View  Result Date: 01/07/2019 CLINICAL DATA:  Shortness of breath EXAM: PORTABLE CHEST 1 VIEW COMPARISON:  January 06, 2015 FINDINGS: There are scattered bilateral airspace opacities. There is no pneumothorax. No large pleural effusion. The heart size is mildly enlarged. Aortic calcifications are noted. There are degenerative changes of both glenohumeral joints. IMPRESSION: Multifocal airspace opacities consistent with the patient's history of viral pneumonia. Electronically Signed   By: Constance Holster M.D.   On: 01/07/2019 16:34    Procedures Procedures (including critical care time)  Medications Ordered in ED Medications  dexamethasone (DECADRON) injection 6 mg (has no administration in time range)  cefTRIAXone (ROCEPHIN) 1 g in sodium chloride 0.9 % 100 mL IVPB (has no administration in time range)  azithromycin (ZITHROMAX) tablet 500 mg (has no administration in time range)    ED Course  I have reviewed the triage vital signs and the nursing notes.  Pertinent labs & imaging results that were available during my care of the patient were  reviewed by me and considered in my medical decision making (see chart for details).  Clinical Course as of Jan 06 2029  Wed Jan 07, 2019  2030 Labs reviewed.  D-dimer C-reactive protein fibrinogen LDH and ferritin elevated.  Consistent with Covid virus infection.  Covid test is still pending Meriel Pica high suspicion.  Patient was given dose of Decadron IV.  Covered with empiric antibiotics until Covid test is resulted.   [JK]    Clinical Course User Index [JK] Dorie Rank, MD   MDM Rules/Calculators/A&P                      Patient symptoms are concerning for Covid virus infection.  She has a new oxygen requirement and is tachypneic but remains hemodynamically stable.  Patient was started on Decadron.  Will admit to the hospital for further treatment.  Glora Jeanphilippe Spagnuolo was evaluated in Emergency Department on 01/07/2019 for the symptoms described in the history of present illness. She was evaluated in the context of the global COVID-19 pandemic, which necessitated consideration that the patient might be at risk for infection with the SARS-CoV-2 virus that causes COVID-19. Institutional protocols and algorithms that pertain to the evaluation of patients at risk for COVID-19 are in a state of rapid change based on information released by regulatory bodies including the CDC and federal and state organizations. These policies and algorithms were followed during the patient's care in the ED.  Final Clinical Impression(s) / ED Diagnoses Final diagnoses:  Person under investigation for COVID-19  Community acquired pneumonia, unspecified laterality     Dorie Rank, MD 01/07/19 2031

## 2019-01-07 NOTE — ED Notes (Signed)
Pt cleaned up and assisted into a clean brief and purwick placed.

## 2019-01-07 NOTE — ED Triage Notes (Signed)
Pt states she has had a cold since 12/28. Pt present c/o cough, dizziness, fevers, pt states she cannot smell and food taste nasty, pt also endorses N/V/D. Pt was 85% on RA. Pt was placed on 2L of O2 [place], pt O2 saturations now 92%, pt temp is 100.7.

## 2019-01-08 ENCOUNTER — Encounter (HOSPITAL_COMMUNITY): Payer: Self-pay | Admitting: Internal Medicine

## 2019-01-08 ENCOUNTER — Other Ambulatory Visit: Payer: Self-pay

## 2019-01-08 LAB — TROPONIN I (HIGH SENSITIVITY): Troponin I (High Sensitivity): 15 ng/L (ref <18)

## 2019-01-08 LAB — COMPREHENSIVE METABOLIC PANEL
ALT: 18 U/L (ref 0–44)
AST: 25 U/L (ref 15–41)
Albumin: 3.3 g/dL — ABNORMAL LOW (ref 3.5–5.0)
Alkaline Phosphatase: 52 U/L (ref 38–126)
Anion gap: 9 (ref 5–15)
BUN: 17 mg/dL (ref 8–23)
CO2: 23 mmol/L (ref 22–32)
Calcium: 8.8 mg/dL — ABNORMAL LOW (ref 8.9–10.3)
Chloride: 110 mmol/L (ref 98–111)
Creatinine, Ser: 0.94 mg/dL (ref 0.44–1.00)
GFR calc Af Amer: >60 mL/min (ref >60)
GFR calc non Af Amer: >60 mL/min (ref >60)
Glucose, Bld: 155 mg/dL — ABNORMAL HIGH (ref 70–99)
Potassium: 3.9 mmol/L (ref 3.5–5.1)
Sodium: 142 mmol/L (ref 135–145)
Total Bilirubin: 0.8 mg/dL (ref 0.3–1.2)
Total Protein: 7.7 g/dL (ref 6.5–8.1)

## 2019-01-08 LAB — URINALYSIS, ROUTINE W REFLEX MICROSCOPIC
Bilirubin Urine: NEGATIVE
Glucose, UA: NEGATIVE mg/dL
Hgb urine dipstick: NEGATIVE
Ketones, ur: NEGATIVE mg/dL
Leukocytes,Ua: NEGATIVE
Nitrite: NEGATIVE
Protein, ur: 100 mg/dL — AB
Specific Gravity, Urine: 1.029 (ref 1.005–1.030)
pH: 5 (ref 5.0–8.0)

## 2019-01-08 LAB — CBC WITH DIFFERENTIAL/PLATELET
Abs Immature Granulocytes: 0.02 10*3/uL (ref 0.00–0.07)
Basophils Absolute: 0 10*3/uL (ref 0.0–0.1)
Basophils Relative: 0 %
Eosinophils Absolute: 0 10*3/uL (ref 0.0–0.5)
Eosinophils Relative: 0 %
HCT: 31.2 % — ABNORMAL LOW (ref 36.0–46.0)
Hemoglobin: 10 g/dL — ABNORMAL LOW (ref 12.0–15.0)
Immature Granulocytes: 0 %
Lymphocytes Relative: 11 %
Lymphs Abs: 0.6 10*3/uL — ABNORMAL LOW (ref 0.7–4.0)
MCH: 28.6 pg (ref 26.0–34.0)
MCHC: 32.1 g/dL (ref 30.0–36.0)
MCV: 89.1 fL (ref 80.0–100.0)
Monocytes Absolute: 0.1 10*3/uL (ref 0.1–1.0)
Monocytes Relative: 2 %
Neutro Abs: 4.4 10*3/uL (ref 1.7–7.7)
Neutrophils Relative %: 87 %
Platelets: 218 10*3/uL (ref 150–400)
RBC: 3.5 MIL/uL — ABNORMAL LOW (ref 3.87–5.11)
RDW: 13.5 % (ref 11.5–15.5)
WBC: 5.1 10*3/uL (ref 4.0–10.5)
nRBC: 0 % (ref 0.0–0.2)

## 2019-01-08 LAB — SARS CORONAVIRUS 2 (TAT 6-24 HRS): SARS Coronavirus 2: POSITIVE — AB

## 2019-01-08 LAB — FERRITIN: Ferritin: 597 ng/mL — ABNORMAL HIGH (ref 11–307)

## 2019-01-08 LAB — ABO/RH: ABO/RH(D): B POS

## 2019-01-08 LAB — MAGNESIUM: Magnesium: 2 mg/dL (ref 1.7–2.4)

## 2019-01-08 LAB — C-REACTIVE PROTEIN: CRP: 16 mg/dL — ABNORMAL HIGH (ref <1.0)

## 2019-01-08 LAB — HIV ANTIBODY (ROUTINE TESTING W REFLEX): HIV Screen 4th Generation wRfx: NONREACTIVE

## 2019-01-08 LAB — D-DIMER, QUANTITATIVE: D-Dimer, Quant: 0.99 ug/mL-FEU — ABNORMAL HIGH (ref 0.00–0.50)

## 2019-01-08 LAB — RESPIRATORY PANEL BY RT PCR (FLU A&B, COVID)
Influenza A by PCR: NEGATIVE
Influenza B by PCR: NEGATIVE
SARS Coronavirus 2 by RT PCR: POSITIVE — AB

## 2019-01-08 MED ORDER — PNEUMOCOCCAL VAC POLYVALENT 25 MCG/0.5ML IJ INJ
0.5000 mL | INJECTION | INTRAMUSCULAR | Status: AC
  Administered 2019-01-10: 0.5 mL via INTRAMUSCULAR
  Filled 2019-01-08: qty 0.5

## 2019-01-08 MED ORDER — SODIUM CHLORIDE 0.9 % IV SOLN
100.0000 mg | Freq: Every day | INTRAVENOUS | Status: AC
  Administered 2019-01-09 – 2019-01-12 (×4): 100 mg via INTRAVENOUS
  Filled 2019-01-08: qty 100
  Filled 2019-01-08: qty 20
  Filled 2019-01-08 (×2): qty 100

## 2019-01-08 MED ORDER — VITAMIN D 25 MCG (1000 UNIT) PO TABS
1000.0000 [IU] | ORAL_TABLET | Freq: Every day | ORAL | Status: DC
  Administered 2019-01-08 – 2019-01-12 (×5): 1000 [IU] via ORAL
  Filled 2019-01-08 (×5): qty 1

## 2019-01-08 MED ORDER — ORAL CARE MOUTH RINSE
15.0000 mL | Freq: Two times a day (BID) | OROMUCOSAL | Status: DC
  Administered 2019-01-08 – 2019-01-12 (×8): 15 mL via OROMUCOSAL

## 2019-01-08 MED ORDER — SODIUM CHLORIDE 0.9 % IV SOLN
200.0000 mg | Freq: Once | INTRAVENOUS | Status: AC
  Administered 2019-01-08: 200 mg via INTRAVENOUS
  Filled 2019-01-08: qty 200

## 2019-01-08 MED ORDER — VITAMIN D3 25 MCG PO TABS
1000.0000 [IU] | ORAL_TABLET | Freq: Every day | ORAL | Status: DC
  Filled 2019-01-08: qty 1

## 2019-01-08 MED ORDER — ZINC SULFATE 220 (50 ZN) MG PO CAPS
220.0000 mg | ORAL_CAPSULE | Freq: Every day | ORAL | Status: DC
  Administered 2019-01-08 – 2019-01-12 (×5): 220 mg via ORAL
  Filled 2019-01-08 (×5): qty 1

## 2019-01-08 MED ORDER — ADULT MULTIVITAMIN W/MINERALS CH
1.0000 | ORAL_TABLET | Freq: Every day | ORAL | Status: DC
  Administered 2019-01-08 – 2019-01-12 (×5): 1 via ORAL
  Filled 2019-01-08 (×5): qty 1

## 2019-01-08 MED ORDER — TOCILIZUMAB 400 MG/20ML IV SOLN
800.0000 mg | Freq: Once | INTRAVENOUS | Status: AC
  Administered 2019-01-08: 800 mg via INTRAVENOUS
  Filled 2019-01-08: qty 40

## 2019-01-08 NOTE — ED Notes (Signed)
Pt reminded again of need for sputum culture but hasn't been coughing or able to get any up at this time.

## 2019-01-08 NOTE — Progress Notes (Signed)
PROGRESS NOTE    Deborah Holmes [facility]  [id number] DOB: 1950/08/13 DOA: 01/07/2019 PCP: Patient, No Pcp Per   Brief Narrative: H&P per Dr. Roel Cluck, Deborah Holmes is a 69 y.o. female with medical history significant of HTN, chronic combined systolic diastolic CHF, ANEMIA. Presented with  cold-like symptoms since 28 December complains of cough lightheadedness fevers she cannot taste or smell food.  Endorses nausea vomiting diarrhea. Noted to be satting 85% on room air started on 2 L of O2 improved to oxygen saturation durations up to 92. She was exposed to her sister on thanksgiving who tested positive for Cvoid. Her Husband has been sick too.  In the ED, Covid test was positive. Patient initially was given a dose of Rocephin/azithro. Started on remdesivir, steroids,    Assessment & Plan:   Active Problems:   Anemia   Hypertension   Chronic combined systolic (congestive) and diastolic (congestive) heart failure (HCC)   Obesity, Class III, BMI 40-49.9 (morbid obesity) ([place])   Suspected COVID-19 virus infection   Acute respiratory failure due to COVID-19 (HCC)  Hypoxic Respiratory Failure, Covid 19 PNA Secondary to Covid 19 PNA. Currently on 4-5L. Consideration of GVC transfer if increases. Continue supportive measures. Hold ABX.  - Decadron 6mg  day 2/10 - remdesivir day 2/5 - Given actemra  - oxygen as needed, wean as tolerated - hold abx, follow cultures - dvt ppx  HTN - hold home medications if becomes hypotensive - Continue losartan and norvasc given elevated BP - continue coreg  Obesity - Continue to monitor  Hx of CHF, diastolic dysfunction - Hold lasix and do not give fluids, assess fluid status, give lasix as needed  DVT prophylaxis: Lovenox Code Status: DNR Family Communication: Updated patient and nursing at bedside Disposition Plan: Home when medically stable   Subjective: No acute events overnight. Started on Covid 19 mediations. O2 increased to 4-5L  overnight in ED. Otherwise no issues, states she is doing okay, just has a cough. No other complaints today.   Objective: Vitals:   01/08/19 0630 01/08/19 0700 01/08/19 0759 01/08/19 1224  BP: 129/66 125/62 125/62 (!) 144/76  Pulse: 79 79 78 88  Resp: (!) 25 (!) 26 20 (!) 24  Temp:   98.5 F (36.9 C) 97.6 F (36.4 C)  TempSrc:   Oral Oral  SpO2: 96% 98% 96% 92%  Weight:   113.9 kg   Height:   5\' 6"  (1.676 m)     Intake/Output Summary (Last 24 hours) at 01/08/2019 1408 Last data filed at 01/08/2019 1338 Gross per 24 hour  Intake 840 ml  Output 200 ml  Net 640 ml   Filed Weights   01/07/19 1646 01/08/19 0759  Weight: 115.7 kg 113.9 kg    Examination:  General exam: Appears calm and comfortable  Respiratory system: Mild rhonchi bilaterally. Respiratory effort normal. Cardiovascular system: S1 & S2 heard, RRR. No pedal edema. Gastrointestinal system: Abdomen is nondistended, soft and nontender. No organomegaly or masses felt. Normal bowel sounds heard. Central nervous system: Alert and oriented. No focal neurological deficits. Extremities: Symmetric 5 x 5 power. Skin: No rashes, lesions or ulcers Psychiatry: Judgement and insight appear normal. Mood & affect appropriate.    Data Reviewed: I have personally reviewed following labs and imaging studies  CBC: Recent Labs  Lab 01/07/19 1709 01/08/19 0628  WBC 7.1 5.1  NEUTROABS 5.9 4.4  HGB 10.7* 10.0*  HCT 34.0* 31.2*  MCV 88.8 89.1  PLT 205 [id number]   Basic Metabolic  Panel: Recent Labs  Lab 01/07/19 1709 01/08/19 0833  NA 145 142  K 3.6 3.9  CL 107 110  CO2 27 23  GLUCOSE 132* 155*  BUN 15 17  CREATININE 1.06* 0.94  CALCIUM 9.0 8.8*  MG  --  2.0   GFR: Estimated Creatinine Clearance: 73.3 mL/min (by C-G formula based on SCr of 0.94 mg/dL). Liver Function Tests: Recent Labs  Lab 01/07/19 1709 01/08/19 0833  AST 32 25  ALT 18 18  ALKPHOS 54 52  BILITOT 1.0 0.8  PROT 8.1 7.7  ALBUMIN 3.5 3.3*   No  results for input(s): LIPASE, AMYLASE in the last 168 hours. No results for input(s): AMMONIA in the last 168 hours. Coagulation Profile: Recent Labs  Lab 01/07/19 1709  INR 1.0   Cardiac Enzymes: No results for input(s): CKTOTAL, CKMB, CKMBINDEX, TROPONINI in the last 168 hours. BNP (last 3 results) No results for input(s): PROBNP in the last 8760 hours. HbA1C: No results for input(s): HGBA1C in the last 72 hours. CBG: No results for input(s): GLUCAP in the last 168 hours. Lipid Profile: Recent Labs    01/07/19 1709  TRIG 89   Thyroid Function Tests: No results for input(s): TSH, T4TOTAL, FREET4, T3FREE, THYROIDAB in the last 72 hours. Anemia Panel: Recent Labs    01/07/19 1709 01/08/19 0833  FERRITIN 643* 597*   Sepsis Labs: Recent Labs  Lab 01/07/19 1709 01/07/19 2043  PROCALCITON <0.10  --   LATICACIDVEN 1.1 0.9    Recent Results (from the past 240 hour(s))  Culture, blood (Routine x 2)     Status: None (Preliminary result)   Collection Time: 01/07/19  4:45 PM   Specimen: BLOOD RIGHT FOREARM  Result Value Ref Range Status   Specimen Description   Final    BLOOD RIGHT FOREARM Performed at [facility] [address], [place], [place] [address]    Special Requests   Final    BOTTLES DRAWN AEROBIC ONLY Blood Culture adequate volume Performed at [facility] [address], [place], [place] [address]    Culture   Final    NO GROWTH < 24 HOURS Performed at [facility] [address], [place], [place] [address]    Report Status PENDING  Incomplete  SARS CORONAVIRUS 2 (TAT 6-24 HRS) Nasopharyngeal Nasopharyngeal Swab     Status: Abnormal   Collection Time: 01/07/19  5:10 PM   Specimen: Nasopharyngeal Swab  Result Value Ref Range Status   SARS Coronavirus 2 POSITIVE (A) NEGATIVE Final    Comment: RESULT CALLED TO, READ BACK BY AND VERIFIED WITH: J.Everlean Patterson [id number] 01/08/19 G.MCADOO (NOTE) SARS-CoV-2 target  nucleic acids are DETECTED. The SARS-CoV-2 RNA is generally detectable in upper and lower respiratory specimens during the acute phase of infection. Positive results are indicative of the presence of SARS-CoV-2 RNA. Clinical correlation with patient history and other diagnostic information is  necessary to determine patient infection status. Positive results do not rule out bacterial infection or co-infection with other viruses.  The expected result is Negative. Fact Sheet for Patients: SugarRoll.be Fact Sheet for Healthcare Providers: https://www.woods-mathews.com/ This test is not yet approved or cleared by the [place] FDA and  has been authorized for detection and/or diagnosis of SARS-CoV-2 by FDA under an Emergency Use Authorization (EUA). This EUA will remain  in effect (meaning this test can be used) for th e duration of the COVID-19 declaration under Section 564(b)(1) of the Act, 21 U.S.C. section 360bbb-3(b)(1),  unless the authorization is terminated or revoked sooner. Performed at [facility], [place] [address], [place], [place] [address]   Culture, blood (Routine x 2)     Status: None (Preliminary result)   Collection Time: 01/07/19  5:15 PM   Specimen: BLOOD LEFT FOREARM  Result Value Ref Range Status   Specimen Description   Final    BLOOD LEFT FOREARM Performed at [facility] [address], [place], [place] [address]    Special Requests   Final    BOTTLES DRAWN AEROBIC AND ANAEROBIC Blood Culture results may not be optimal due to an inadequate volume of blood received in culture bottles Performed at [facility] [address], [place], [place] [address]    Culture   Final    NO GROWTH < 24 HOURS Performed at [facility] [address] [place], [place] [address]    Report Status PENDING  Incomplete  Respiratory Panel by RT PCR (Flu A&B, Covid) - Nasopharyngeal Swab      Status: Abnormal   Collection Time: 01/07/19  8:43 PM   Specimen: Nasopharyngeal Swab  Result Value Ref Range Status   SARS Coronavirus 2 by RT PCR POSITIVE (A) NEGATIVE Final    Comment: CRITICAL RESULT CALLED TO, READ BACK BY AND VERIFIED WITH: RN Alanson Aly AT 0302 01/08/19 CRUICKSHANK A (NOTE) SARS-CoV-2 target nucleic acids are DETECTED. SARS-CoV-2 RNA is generally detectable in upper respiratory specimens  during the acute phase of infection. Positive results are indicative of the presence of the identified virus, but do not rule out bacterial infection or co-infection with other pathogens not detected by the test. Clinical correlation with patient history and other diagnostic information is necessary to determine patient infection status. The expected result is Negative. Fact Sheet for Patients:  PinkCheek.be Fact Sheet for Healthcare Providers: GravelBags.it This test is not yet approved or cleared by the [place] FDA and  has been authorized for detection and/or diagnosis of SARS-CoV-2 by FDA under an Emergency Use Authorization (EUA).  This EUA will remain in effect (meaning this t est can be used) for the duration of  the COVID-19 declaration under Section 564(b)(1) of the Act, 21 U.S.C. section 360bbb-3(b)(1), unless the authorization is terminated or revoked sooner.    Influenza A by PCR NEGATIVE NEGATIVE Final   Influenza B by PCR NEGATIVE NEGATIVE Final    Comment: (NOTE) The Xpert Xpress SARS-CoV-2/FLU/RSV assay is intended as an aid in  the diagnosis of influenza from Nasopharyngeal swab specimens and  should not be used as a sole basis for treatment. Nasal washings and  aspirates are unacceptable for Xpert Xpress SARS-CoV-2/FLU/RSV  testing. Fact Sheet for Patients: PinkCheek.be Fact Sheet for Healthcare Providers: GravelBags.it This test  is not yet approved or cleared by the [place] FDA and  has been authorized for detection and/or diagnosis of SARS-CoV-2 by  FDA under an Emergency Use Authorization (EUA). This EUA will remain  in effect (meaning this test can be used) for the duration of the  Covid-19 declaration under Section 564(b)(1) of the Act, 21  U.S.C. section 360bbb-3(b)(1), unless the authorization is  terminated or revoked. Performed at [facility], [place] [address], [place], [place] [address]        Radiology Studies: DG Chest Portable 1 View  Result Date: 01/07/2019 CLINICAL DATA:  Shortness of breath EXAM: PORTABLE CHEST 1 VIEW COMPARISON:  January 06, 2015 FINDINGS: There are scattered bilateral airspace  opacities. There is no pneumothorax. No large pleural effusion. The heart size is mildly enlarged. Aortic calcifications are noted. There are degenerative changes of both glenohumeral joints. IMPRESSION: Multifocal airspace opacities consistent with the patient's history of viral pneumonia. Electronically Signed   By: Constance Holster M.D.   On: 01/07/2019 16:34     Scheduled Meds: . amLODipine  5 mg Oral Daily  . aspirin EC  81 mg Oral Daily  . carvedilol  25 mg Oral BID  . dexamethasone (DECADRON) injection  6 mg Intravenous Q24H  . enoxaparin (LOVENOX) injection  60 mg Subcutaneous Q24H  . losartan  100 mg Oral Daily  . mouth rinse  15 mL Mouth Rinse BID  . [START ON 01/09/2019] pneumococcal 23 valent vaccine  0.5 mL Intramuscular Tomorrow-1000  . sodium chloride flush  3 mL Intravenous Q12H  . sodium chloride flush  3 mL Intravenous Q12H   Continuous Infusions: . sodium chloride    . [START ON 01/09/2019] remdesivir 100 mg in NS 100 mL       LOS: 1 day    Time spent: 20 minutes  Arma Heading, MD [facility]  If 7PM-7AM, please contact night-coverage [email or web address] Password [facility] 01/08/2019, 2:08 PM

## 2019-01-08 NOTE — ED Notes (Signed)
Pt has had a couple incontinent episodes while coughing and has been unable to get to bedside in time to give urine sample. Pt placed on purwick and will continue to try and obtain urine sample.

## 2019-01-09 LAB — CBC WITH DIFFERENTIAL/PLATELET
Abs Immature Granulocytes: 0.04 10*3/uL (ref 0.00–0.07)
Basophils Absolute: 0 10*3/uL (ref 0.0–0.1)
Basophils Relative: 0 %
Eosinophils Absolute: 0 10*3/uL (ref 0.0–0.5)
Eosinophils Relative: 0 %
HCT: 33.6 % — ABNORMAL LOW (ref 36.0–46.0)
Hemoglobin: 10.5 g/dL — ABNORMAL LOW (ref 12.0–15.0)
Immature Granulocytes: 1 %
Lymphocytes Relative: 12 %
Lymphs Abs: 0.7 10*3/uL (ref 0.7–4.0)
MCH: 28.5 pg (ref 26.0–34.0)
MCHC: 31.3 g/dL (ref 30.0–36.0)
MCV: 91.1 fL (ref 80.0–100.0)
Monocytes Absolute: 0.2 10*3/uL (ref 0.1–1.0)
Monocytes Relative: 4 %
Neutro Abs: 4.8 10*3/uL (ref 1.7–7.7)
Neutrophils Relative %: 83 %
Platelets: 191 10*3/uL (ref 150–400)
RBC: 3.69 MIL/uL — ABNORMAL LOW (ref 3.87–5.11)
RDW: 13.3 % (ref 11.5–15.5)
WBC: 5.8 10*3/uL (ref 4.0–10.5)
nRBC: 0 % (ref 0.0–0.2)

## 2019-01-09 LAB — COMPREHENSIVE METABOLIC PANEL
ALT: 18 U/L (ref 0–44)
AST: 27 U/L (ref 15–41)
Albumin: 2.9 g/dL — ABNORMAL LOW (ref 3.5–5.0)
Alkaline Phosphatase: 47 U/L (ref 38–126)
Anion gap: 9 (ref 5–15)
BUN: 20 mg/dL (ref 8–23)
CO2: 23 mmol/L (ref 22–32)
Calcium: 9 mg/dL (ref 8.9–10.3)
Chloride: 110 mmol/L (ref 98–111)
Creatinine, Ser: 0.8 mg/dL (ref 0.44–1.00)
GFR calc Af Amer: >60 mL/min (ref >60)
GFR calc non Af Amer: >60 mL/min (ref >60)
Glucose, Bld: 154 mg/dL — ABNORMAL HIGH (ref 70–99)
Potassium: 5 mmol/L (ref 3.5–5.1)
Sodium: 142 mmol/L (ref 135–145)
Total Bilirubin: 0.9 mg/dL (ref 0.3–1.2)
Total Protein: 7.2 g/dL (ref 6.5–8.1)

## 2019-01-09 LAB — D-DIMER, QUANTITATIVE: D-Dimer, Quant: 0.79 ug/mL-FEU — ABNORMAL HIGH (ref 0.00–0.50)

## 2019-01-09 LAB — MAGNESIUM: Magnesium: 2.2 mg/dL (ref 1.7–2.4)

## 2019-01-09 LAB — C-REACTIVE PROTEIN: CRP: 9.3 mg/dL — ABNORMAL HIGH (ref <1.0)

## 2019-01-09 LAB — FERRITIN: Ferritin: 549 ng/mL — ABNORMAL HIGH (ref 11–307)

## 2019-01-09 MED ORDER — LABETALOL HCL 5 MG/ML IV SOLN: 10.0000 mg | INTRAVENOUS | Status: DC | PRN

## 2019-01-09 NOTE — TOC Progression Note (Signed)
Transition of Care [facility]) - Progression Note    Patient Details  Name: Deborah Holmes MRN: [id number] Date of Birth: 1950/05/14  [facility]) CM/SW Contact  Purcell Mouton, RN Phone Number: 01/09/2019, 2:28 PM  Clinical Narrative:    Trenda Moots will follow pt at home with HHRN/PT. [employer] for Home O2 if needed.         Expected Discharge Plan and Services                                                 Social Determinants of Health (SDOH) Interventions    Readmission Risk Interventions No flowsheet data found.

## 2019-01-09 NOTE — Plan of Care (Signed)

## 2019-01-09 NOTE — Care Management Important Message (Signed)
Important Message  Patient Details IM Letter given to Gabriel Earing RN Case Manager to present to the Patient Name: Deborah Holmes [facility] MRN: [id number] Date of Birth: [birth year]   Medicare Important Message Given:  Yes     Kerin Salen 01/09/2019, 12:33 PM

## 2019-01-09 NOTE — Progress Notes (Signed)
PROGRESS NOTE    Deborah Holmes [facility]  [id number] DOB: [birth year] DOA: 01/07/2019 PCP: Patient, No Pcp Per   Brief Narrative: H&P per Dr. Roel Cluck, Deborah Holmes is a 69 y.o. female with medical history significant of HTN, chronic combined systolic diastolic CHF, ANEMIA. Presented with  cold-like symptoms since 28 December complains of cough lightheadedness fevers she cannot taste or smell food.  Endorses nausea vomiting diarrhea. Noted to be satting 85% on room air started on 2 L of O2 improved to oxygen saturation durations up to 92. She was exposed to her sister on thanksgiving who tested positive for Cvoid. Her Husband has been sick too.  In the ED, Covid test was positive. Patient initially was given a dose of Rocephin/azithro. Started on remdesivir, steroids,    Assessment & Plan:   Active Problems:   Anemia   Hypertension   Chronic combined systolic (congestive) and diastolic (congestive) heart failure (HCC)   Obesity, Class III, BMI 40-49.9 (morbid obesity) ([place])   Suspected COVID-19 virus infection   Acute respiratory failure due to COVID-19 (HCC)  Hypoxic Respiratory Failure, Covid 19 PNA Secondary to Covid 19 PNA. Currently on 4-5L. Consideration of GVC transfer if increases. Continue supportive measures. Hold ABX.  Weaned no room air this AM, but back on 4L. Labs improving.  - Decadron 6mg  day 3/10 - remdesivir day 3/5 - Given actemra  - oxygen as needed, wean as tolerated - hold abx, follow cultures - dvt ppx  HTN - hold home medications if becomes hypotensive - Continue losartan and norvasc given elevated BP - continue coreg - Labetalol as needed   Obesity - Continue to monitor  Hx of CHF, diastolic dysfunction - Hold lasix and do not give fluids, assess fluid status, give lasix as needed  DVT prophylaxis: Lovenox Code Status: DNR Family Communication: Updated patient and nursing at bedside Disposition Plan: Home when medically  stable   Subjective: No acute events. Feel symptomatically better. Less coughing today. Overall doing well without any acute issues. Still intermittently requiring oxygen. No other issues at this time.   Objective: Vitals:   01/09/19 0425 01/09/19 0900 01/09/19 1223 01/09/19 1628  BP: 121/79  (!) 170/99   Pulse: 83  78 80  Resp: 18  (!) 22   Temp: 98.4 F (36.9 C)  98.4 F (36.9 C)   TempSrc: Oral  Oral   SpO2: 93% 100% 93% 95%  Weight:      Height:        Intake/Output Summary (Last 24 hours) at 01/09/2019 1635 Last data filed at 01/09/2019 1000 Gross per 24 hour  Intake 350 ml  Output --  Net 350 ml   Filed Weights   01/07/19 1646 01/08/19 0759  Weight: 115.7 kg 113.9 kg    Examination:  General exam: Appears calm and comfortable. Exam unchanged.  Respiratory system: Mild rhonchi bilaterally. Respiratory effort normal. Cardiovascular system: S1 & S2 heard, RRR. No pedal edema. Gastrointestinal system: Abdomen is nondistended, soft and nontender. No organomegaly or masses felt. Normal bowel sounds heard. Central nervous system: Alert and oriented. No focal neurological deficits. Extremities: Symmetric 5 x 5 power. Skin: No rashes, lesions or ulcers Psychiatry: Judgement and insight appear normal. Mood & affect appropriate.    Data Reviewed: I have personally reviewed following labs and imaging studies  CBC: Recent Labs  Lab 01/07/19 1709 01/08/19 0628 01/09/19 0244  WBC 7.1 5.1 5.8  NEUTROABS 5.9 4.4 4.8  HGB 10.7* 10.0* 10.5*  HCT 34.0* 31.2*  33.6*  MCV 88.8 89.1 91.1  PLT 205 218 [id number]   Basic Metabolic Panel: Recent Labs  Lab 01/07/19 1709 01/08/19 0833 01/09/19 0244  NA 145 142 142  K 3.6 3.9 5.0  CL 107 110 110  CO2 27 23 23   GLUCOSE 132* 155* 154*  BUN 15 17 20   CREATININE 1.06* 0.94 0.80  CALCIUM 9.0 8.8* 9.0  MG  --  2.0 2.2   GFR: Estimated Creatinine Clearance: 86.2 mL/min (by C-G formula based on SCr of 0.8 mg/dL). Liver Function  Tests: Recent Labs  Lab 01/07/19 1709 01/08/19 0833 01/09/19 0244  AST 32 25 27  ALT 18 18 18   ALKPHOS 54 52 47  BILITOT 1.0 0.8 0.9  PROT 8.1 7.7 7.2  ALBUMIN 3.5 3.3* 2.9*   No results for input(s): LIPASE, AMYLASE in the last 168 hours. No results for input(s): AMMONIA in the last 168 hours. Coagulation Profile: Recent Labs  Lab 01/07/19 1709  INR 1.0   Cardiac Enzymes: No results for input(s): CKTOTAL, CKMB, CKMBINDEX, TROPONINI in the last 168 hours. BNP (last 3 results) No results for input(s): PROBNP in the last 8760 hours. HbA1C: No results for input(s): HGBA1C in the last 72 hours. CBG: No results for input(s): GLUCAP in the last 168 hours. Lipid Profile: Recent Labs    01/07/19 1709  TRIG 89   Thyroid Function Tests: No results for input(s): TSH, T4TOTAL, FREET4, T3FREE, THYROIDAB in the last 72 hours. Anemia Panel: Recent Labs    01/08/19 0833 01/09/19 0244  FERRITIN 597* 549*   Sepsis Labs: Recent Labs  Lab 01/07/19 1709 01/07/19 2043  PROCALCITON <0.10  --   LATICACIDVEN 1.1 0.9    Recent Results (from the past 240 hour(s))  Culture, blood (Routine x 2)     Status: None (Preliminary result)   Collection Time: 01/07/19  4:45 PM   Specimen: BLOOD RIGHT FOREARM  Result Value Ref Range Status   Specimen Description   Final    BLOOD RIGHT FOREARM Performed at [facility] [address], [place], [place] [address]    Special Requests   Final    BOTTLES DRAWN AEROBIC ONLY Blood Culture adequate volume Performed at [facility] [address] [place], [place] [address]    Culture   Final    NO GROWTH 2 DAYS Performed at [facility] [address], [place], [place] [address]    Report Status PENDING  Incomplete  SARS CORONAVIRUS 2 (TAT 6-24 HRS) Nasopharyngeal Nasopharyngeal Swab     Status: Abnormal   Collection Time: 01/07/19  5:10 PM   Specimen: Nasopharyngeal Swab  Result Value Ref  Range Status   SARS Coronavirus 2 POSITIVE (A) NEGATIVE Final    Comment: RESULT CALLED TO, READ BACK BY AND VERIFIED WITH: J.Everlean Patterson [id number] 01/08/19 G.MCADOO (NOTE) SARS-CoV-2 target nucleic acids are DETECTED. The SARS-CoV-2 RNA is generally detectable in upper and lower respiratory specimens during the acute phase of infection. Positive results are indicative of the presence of SARS-CoV-2 RNA. Clinical correlation with patient history and other diagnostic information is  necessary to determine patient infection status. Positive results do not rule out bacterial infection or co-infection with other viruses.  The expected result is Negative. Fact Sheet for Patients: SugarRoll.be Fact Sheet for Healthcare Providers: https://www.woods-mathews.com/ This test is not yet approved or cleared by the [place] FDA and  has been authorized for detection and/or diagnosis of SARS-CoV-2 by FDA under an Emergency Use  Authorization (EUA). This EUA will remain  in effect (meaning this test can be used) for th e duration of the COVID-19 declaration under Section 564(b)(1) of the Act, 21 U.S.C. section 360bbb-3(b)(1), unless the authorization is terminated or revoked sooner. Performed at [facility], [place] [address], [place], [place] [address]   Culture, blood (Routine x 2)     Status: None (Preliminary result)   Collection Time: 01/07/19  5:15 PM   Specimen: BLOOD LEFT FOREARM  Result Value Ref Range Status   Specimen Description   Final    BLOOD LEFT FOREARM Performed at [facility] [address], [place], [place] [address]    Special Requests   Final    BOTTLES DRAWN AEROBIC AND ANAEROBIC Blood Culture results may not be optimal due to an inadequate volume of blood received in culture bottles Performed at [facility] [address], [place], [place] [address]    Culture   Final    NO GROWTH 2  DAYS Performed at [facility] [address] [place],  [address]    Report Status PENDING  Incomplete  Respiratory Panel by RT PCR (Flu A&B, Covid) - Nasopharyngeal Swab     Status: Abnormal   Collection Time: 01/07/19  8:43 PM   Specimen: Nasopharyngeal Swab  Result Value Ref Range Status   SARS Coronavirus 2 by RT PCR POSITIVE (A) NEGATIVE Final    Comment: CRITICAL RESULT CALLED TO, READ BACK BY AND VERIFIED WITH: RN Alanson Aly AT 0302 01/08/19 CRUICKSHANK A (NOTE) SARS-CoV-2 target nucleic acids are DETECTED. SARS-CoV-2 RNA is generally detectable in upper respiratory specimens  during the acute phase of infection. Positive results are indicative of the presence of the identified virus, but do not rule out bacterial infection or co-infection with other pathogens not detected by the test. Clinical correlation with patient history and other diagnostic information is necessary to determine patient infection status. The expected result is Negative. Fact Sheet for Patients:  PinkCheek.be Fact Sheet for Healthcare Providers: GravelBags.it This test is not yet approved or cleared by the [place] FDA and  has been authorized for detection and/or diagnosis of SARS-CoV-2 by FDA under an Emergency Use Authorization (EUA).  This EUA will remain in effect (meaning this t est can be used) for the duration of  the COVID-19 declaration under Section 564(b)(1) of the Act, 21 U.S.C. section 360bbb-3(b)(1), unless the authorization is terminated or revoked sooner.    Influenza A by PCR NEGATIVE NEGATIVE Final   Influenza B by PCR NEGATIVE NEGATIVE Final    Comment: (NOTE) The Xpert Xpress SARS-CoV-2/FLU/RSV assay is intended as an aid in  the diagnosis of influenza from Nasopharyngeal swab specimens and  should not be used as a sole basis for treatment. Nasal washings and  aspirates are unacceptable for Xpert Xpress  SARS-CoV-2/FLU/RSV  testing. Fact Sheet for Patients: PinkCheek.be Fact Sheet for Healthcare Providers: GravelBags.it This test is not yet approved or cleared by the [place] FDA and  has been authorized for detection and/or diagnosis of SARS-CoV-2 by  FDA under an Emergency Use Authorization (EUA). This EUA will remain  in effect (meaning this test can be used) for the duration of the  Covid-19 declaration under Section 564(b)(1) of the Act, 21  U.S.C. section 360bbb-3(b)(1), unless the authorization is  terminated or revoked. Performed at [facility], [place] [address] [place],  [address]        Radiology  Studies: No results found.   Scheduled Meds: . amLODipine  5 mg Oral Daily  . aspirin EC  81 mg Oral Daily  . carvedilol  25 mg Oral BID  . cholecalciferol  1,000 Units Oral Daily  . dexamethasone (DECADRON) injection  6 mg Intravenous Q24H  . enoxaparin (LOVENOX) injection  60 mg Subcutaneous Q24H  . losartan  100 mg Oral Daily  . mouth rinse  15 mL Mouth Rinse BID  . multivitamin with minerals  1 tablet Oral Daily  . pneumococcal 23 valent vaccine  0.5 mL Intramuscular Tomorrow-1000  . sodium chloride flush  3 mL Intravenous Q12H  . sodium chloride flush  3 mL Intravenous Q12H  . zinc sulfate  220 mg Oral Daily   Continuous Infusions: . sodium chloride    . remdesivir 100 mg in NS 100 mL 100 mg (01/09/19 0901)     LOS: 2 days   Time spent: 20 minutes  Arma Heading, MD [facility]  If 7PM-7AM, please contact night-coverage [email or web address] Password [facility] 01/09/2019, 4:35 PM

## 2019-01-09 NOTE — TOC Progression Note (Signed)
Transition of Care [facility]) - Progression Note    Patient Details  Name: Deborah Holmes MRN: [id number] Date of Birth: [birth year]  Transition of Care [facility]) CM/SW Contact  Purcell Mouton, RN Phone Number: 01/09/2019, 1:37 PM  Clinical Narrative:    [facility] will follow pt at home for HHRN/PT.        Expected Discharge Plan and Services                                                 Social Determinants of Health (SDOH) Interventions    Readmission Risk Interventions No flowsheet data found.

## 2019-01-10 LAB — CBC WITH DIFFERENTIAL/PLATELET
Abs Immature Granulocytes: 0.11 10*3/uL — ABNORMAL HIGH (ref 0.00–0.07)
Basophils Absolute: 0 10*3/uL (ref 0.0–0.1)
Basophils Relative: 0 %
Eosinophils Absolute: 0 10*3/uL (ref 0.0–0.5)
Eosinophils Relative: 0 %
HCT: 35.4 % — ABNORMAL LOW (ref 36.0–46.0)
Hemoglobin: 10.7 g/dL — ABNORMAL LOW (ref 12.0–15.0)
Immature Granulocytes: 1 %
Lymphocytes Relative: 9 %
Lymphs Abs: 0.9 10*3/uL (ref 0.7–4.0)
MCH: 27.5 pg (ref 26.0–34.0)
MCHC: 30.2 g/dL (ref 30.0–36.0)
MCV: 91 fL (ref 80.0–100.0)
Monocytes Absolute: 0.4 10*3/uL (ref 0.1–1.0)
Monocytes Relative: 4 %
Neutro Abs: 8.6 10*3/uL — ABNORMAL HIGH (ref 1.7–7.7)
Neutrophils Relative %: 86 %
Platelets: 242 10*3/uL (ref 150–400)
RBC: 3.89 MIL/uL (ref 3.87–5.11)
RDW: 13.2 % (ref 11.5–15.5)
WBC: 10 10*3/uL (ref 4.0–10.5)
nRBC: 0 % (ref 0.0–0.2)

## 2019-01-10 LAB — COMPREHENSIVE METABOLIC PANEL
ALT: 58 U/L — ABNORMAL HIGH (ref 0–44)
AST: 56 U/L — ABNORMAL HIGH (ref 15–41)
Albumin: 2.9 g/dL — ABNORMAL LOW (ref 3.5–5.0)
Alkaline Phosphatase: 57 U/L (ref 38–126)
Anion gap: 11 (ref 5–15)
BUN: 24 mg/dL — ABNORMAL HIGH (ref 8–23)
CO2: 20 mmol/L — ABNORMAL LOW (ref 22–32)
Calcium: 8.9 mg/dL (ref 8.9–10.3)
Chloride: 110 mmol/L (ref 98–111)
Creatinine, Ser: 0.84 mg/dL (ref 0.44–1.00)
GFR calc Af Amer: >60 mL/min (ref >60)
GFR calc non Af Amer: >60 mL/min (ref >60)
Glucose, Bld: 146 mg/dL — ABNORMAL HIGH (ref 70–99)
Potassium: 4.6 mmol/L (ref 3.5–5.1)
Sodium: 141 mmol/L (ref 135–145)
Total Bilirubin: 0.6 mg/dL (ref 0.3–1.2)
Total Protein: 7.2 g/dL (ref 6.5–8.1)

## 2019-01-10 LAB — FERRITIN: Ferritin: 537 ng/mL — ABNORMAL HIGH (ref 11–307)

## 2019-01-10 LAB — D-DIMER, QUANTITATIVE: D-Dimer, Quant: 0.97 ug/mL-FEU — ABNORMAL HIGH (ref 0.00–0.50)

## 2019-01-10 LAB — C-REACTIVE PROTEIN: CRP: 4.6 mg/dL — ABNORMAL HIGH (ref <1.0)

## 2019-01-10 LAB — MAGNESIUM: Magnesium: 2.3 mg/dL (ref 1.7–2.4)

## 2019-01-10 MED ORDER — BISACODYL 5 MG PO TBEC
5.0000 mg | DELAYED_RELEASE_TABLET | Freq: Every day | ORAL | Status: DC | PRN
  Administered 2019-01-11: 5 mg via ORAL
  Filled 2019-01-10: qty 1

## 2019-01-10 NOTE — Progress Notes (Signed)
PROGRESS NOTE    Maghen Seivert [facility]  [id number] DOB: July 23, 1950 DOA: 01/07/2019 PCP: Patient, No Pcp Per   Brief Narrative: H&P per Dr. Roel Cluck, Tenicia Bergstrom is a 69 y.o. female with medical history significant of HTN, chronic combined systolic diastolic CHF, ANEMIA. Presented with  cold-like symptoms since 28 December complains of cough lightheadedness fevers she cannot taste or smell food.  Endorses nausea vomiting diarrhea. Noted to be satting 85% on room air started on 2 L of O2 improved to oxygen saturation durations up to 92. She was exposed to her sister on thanksgiving who tested positive for Cvoid. Her Husband has been sick too.  In the ED, Covid test was positive. Patient initially was given a dose of Rocephin/azithro. Started on remdesivir, steroids.  Interim History Has remained off antibiotics. Has been tolerating remdesivir and steroids. Has remained intermittently on 2-4L oxygen. Has yet to wean. Outpatient PT/RN setup.   Assessment & Plan:   Active Problems:   Anemia   Hypertension   Chronic combined systolic (congestive) and diastolic (congestive) heart failure (HCC)   Obesity, Class III, BMI 40-49.9 (morbid obesity) ([place])   Suspected COVID-19 virus infection   Acute respiratory failure due to COVID-19 (HCC)  Hypoxic Respiratory Failure, Covid 19 PNA Secondary to Covid 19 PNA. Currently on 4L. Continue supportive measures. Hold ABX.  Weaned no room air this AM, but back on 4L. Labs stable - Decadron 6mg  day 4/10 - remdesivir day 4/5 - Given actemra  - oxygen as needed, wean as tolerated - hold abx, follow cultures - dvt ppx - IS, good bronchial hygeiene  HTN - hold home medications if becomes hypotensive - Continue losartan and norvasc given elevated BP - continue coreg - Labetalol as needed   Obesity - Continue to monitor  Hx of CHF, diastolic dysfunction - Hold lasix and do not give fluids, assess fluid status, give lasix as needed  DVT  prophylaxis: Lovenox Code Status: DNR Family Communication: Updated patient and nursing at bedside Disposition Plan: Home when medically stable   Subjective: No complaints today. Subjectively feels better. States she does not have a PCP, will need one. Was encouraged to look one up. She may need to be offered one that is local to her upon discharge. Otherwise no complaints today. Remains on oxygen, needs to get up into the chair today. Otherwise eating and drinking well.    Objective: Vitals:   01/09/19 1223 01/09/19 1628 01/09/19 2056 01/10/19 0435  BP: (!) 170/99  (!) 141/75 (!) 144/75  Pulse: 78 80 81 76  Resp: (!) 22  18 18   Temp: 98.4 F (36.9 C)  98.8 F (37.1 C) 98.7 F (37.1 C)  TempSrc: Oral  Oral Oral  SpO2: 93% 95% 100% 91%  Weight:      Height:        Intake/Output Summary (Last 24 hours) at 01/10/2019 1033 Last data filed at 01/10/2019 0436 Gross per 24 hour  Intake 600 ml  Output 900 ml  Net -300 ml   Filed Weights   01/07/19 1646 01/08/19 0759  Weight: 115.7 kg 113.9 kg    Examination:  General exam: Appears calm and comfortable. Exam unchanged. Sitting up in bed Respiratory system: Mild rhonchi bilaterally. Respiratory effort normal. No change.  Cardiovascular system: S1 & S2 heard, RRR. No pedal edema. Gastrointestinal system: Abdomen is nondistended, soft and nontender. No organomegaly or masses felt. Normal bowel sounds heard. Central nervous system: Alert and oriented. No focal neurological deficits. Extremities:  Symmetric 5 x 5 power. Skin: No rashes, lesions or ulcers Psychiatry: Judgement and insight appear normal. Mood & affect appropriate.    Data Reviewed: I have personally reviewed following labs and imaging studies  CBC: Recent Labs  Lab 01/07/19 1709 01/08/19 0628 01/09/19 0244 01/10/19 0246  WBC 7.1 5.1 5.8 10.0  NEUTROABS 5.9 4.4 4.8 8.6*  HGB 10.7* 10.0* 10.5* 10.7*  HCT 34.0* 31.2* 33.6* 35.4*  MCV 88.8 89.1 91.1 91.0  PLT  205 218 191 [id number]   Basic Metabolic Panel: Recent Labs  Lab 01/07/19 1709 01/08/19 0833 01/09/19 0244 01/10/19 0246  NA 145 142 142 141  K 3.6 3.9 5.0 4.6  CL 107 110 110 110  CO2 27 23 23  20*  GLUCOSE 132* 155* 154* 146*  BUN 15 17 20  24*  CREATININE 1.06* 0.94 0.80 0.84  CALCIUM 9.0 8.8* 9.0 8.9  MG  --  2.0 2.2 2.3   GFR: Estimated Creatinine Clearance: 82.1 mL/min (by C-G formula based on SCr of 0.84 mg/dL). Liver Function Tests: Recent Labs  Lab 01/07/19 1709 01/08/19 0833 01/09/19 0244 01/10/19 0246  AST 32 25 27 56*  ALT 18 18 18  58*  ALKPHOS 54 52 47 57  BILITOT 1.0 0.8 0.9 0.6  PROT 8.1 7.7 7.2 7.2  ALBUMIN 3.5 3.3* 2.9* 2.9*   No results for input(s): LIPASE, AMYLASE in the last 168 hours. No results for input(s): AMMONIA in the last 168 hours. Coagulation Profile: Recent Labs  Lab 01/07/19 1709  INR 1.0   Cardiac Enzymes: No results for input(s): CKTOTAL, CKMB, CKMBINDEX, TROPONINI in the last 168 hours. BNP (last 3 results) No results for input(s): PROBNP in the last 8760 hours. HbA1C: No results for input(s): HGBA1C in the last 72 hours. CBG: No results for input(s): GLUCAP in the last 168 hours. Lipid Profile: Recent Labs    01/07/19 1709  TRIG 89   Thyroid Function Tests: No results for input(s): TSH, T4TOTAL, FREET4, T3FREE, THYROIDAB in the last 72 hours. Anemia Panel: Recent Labs    01/09/19 0244 01/10/19 0246  FERRITIN 549* 537*   Sepsis Labs: Recent Labs  Lab 01/07/19 1709 01/07/19 2043  PROCALCITON <0.10  --   LATICACIDVEN 1.1 0.9    Recent Results (from the past 240 hour(s))  Culture, blood (Routine x 2)     Status: None (Preliminary result)   Collection Time: 01/07/19  4:45 PM   Specimen: BLOOD RIGHT FOREARM  Result Value Ref Range Status   Specimen Description   Final    BLOOD RIGHT FOREARM Performed at [facility] [address], [place], [place] [address]    Special Requests   Final     BOTTLES DRAWN AEROBIC ONLY Blood Culture adequate volume Performed at [facility] [address], [place], [place] [address]    Culture   Final    NO GROWTH 3 DAYS Performed at [facility], [place] [address] [place], [place] [address]    Report Status PENDING  Incomplete  SARS CORONAVIRUS 2 (TAT 6-24 HRS) Nasopharyngeal Nasopharyngeal Swab     Status: Abnormal   Collection Time: 01/07/19  5:10 PM   Specimen: Nasopharyngeal Swab  Result Value Ref Range Status   SARS Coronavirus 2 POSITIVE (A) NEGATIVE Final    Comment: RESULT CALLED TO, READ BACK BY AND VERIFIED WITH: J.Everlean Patterson [id number] 01/08/19 G.MCADOO (NOTE) SARS-CoV-2 target nucleic acids are DETECTED. The SARS-CoV-2 RNA is generally detectable in upper and lower respiratory specimens  during the acute phase of infection. Positive results are indicative of the presence of SARS-CoV-2 RNA. Clinical correlation with patient history and other diagnostic information is  necessary to determine patient infection status. Positive results do not rule out bacterial infection or co-infection with other viruses.  The expected result is Negative. Fact Sheet for Patients: SugarRoll.be Fact Sheet for Healthcare Providers: https://www.woods-mathews.com/ This test is not yet approved or cleared by the [place] FDA and  has been authorized for detection and/or diagnosis of SARS-CoV-2 by FDA under an Emergency Use Authorization (EUA). This EUA will remain  in effect (meaning this test can be used) for th e duration of the COVID-19 declaration under Section 564(b)(1) of the Act, 21 U.S.C. section 360bbb-3(b)(1), unless the authorization is terminated or revoked sooner. Performed at [facility], [place] [address], [place], [place] [address]   Culture, blood (Routine x 2)     Status: None (Preliminary result)   Collection Time: 01/07/19  5:15 PM   Specimen: BLOOD LEFT  FOREARM  Result Value Ref Range Status   Specimen Description   Final    BLOOD LEFT FOREARM Performed at [facility] [address], [place], [place] [address]    Special Requests   Final    BOTTLES DRAWN AEROBIC AND ANAEROBIC Blood Culture results may not be optimal due to an inadequate volume of blood received in culture bottles Performed at [facility] [address], [place], [place] [address]    Culture   Final    NO GROWTH 3 DAYS Performed at [facility], [place] [address] [place], [place] [address]    Report Status PENDING  Incomplete  Respiratory Panel by RT PCR (Flu A&B, Covid) - Nasopharyngeal Swab     Status: Abnormal   Collection Time: 01/07/19  8:43 PM   Specimen: Nasopharyngeal Swab  Result Value Ref Range Status   SARS Coronavirus 2 by RT PCR POSITIVE (A) NEGATIVE Final    Comment: CRITICAL RESULT CALLED TO, READ BACK BY AND VERIFIED WITH: RN Alanson Aly AT 0302 01/08/19 CRUICKSHANK A (NOTE) SARS-CoV-2 target nucleic acids are DETECTED. SARS-CoV-2 RNA is generally detectable in upper respiratory specimens  during the acute phase of infection. Positive results are indicative of the presence of the identified virus, but do not rule out bacterial infection or co-infection with other pathogens not detected by the test. Clinical correlation with patient history and other diagnostic information is necessary to determine patient infection status. The expected result is Negative. Fact Sheet for Patients:  PinkCheek.be Fact Sheet for Healthcare Providers: GravelBags.it This test is not yet approved or cleared by the [place] FDA and  has been authorized for detection and/or diagnosis of SARS-CoV-2 by FDA under an Emergency Use Authorization (EUA).  This EUA will remain in effect (meaning this t est can be used) for the duration of  the COVID-19 declaration under  Section 564(b)(1) of the Act, 21 U.S.C. section 360bbb-3(b)(1), unless the authorization is terminated or revoked sooner.    Influenza A by PCR NEGATIVE NEGATIVE Final   Influenza B by PCR NEGATIVE NEGATIVE Final    Comment: (NOTE) The Xpert Xpress SARS-CoV-2/FLU/RSV assay is intended as an aid in  the diagnosis of influenza from Nasopharyngeal swab specimens and  should not be used as a sole basis for treatment. Nasal washings and  aspirates are unacceptable for Xpert Xpress SARS-CoV-2/FLU/RSV  testing. Fact Sheet for Patients: PinkCheek.be Fact Sheet for Healthcare Providers: GravelBags.it This  test is not yet approved or cleared by the [place] and  has been authorized for detection and/or diagnosis of SARS-CoV-2 by  FDA under an Emergency Use Authorization (EUA). This EUA will remain  in effect (meaning this test can be used) for the duration of the  Covid-19 declaration under Section 564(b)(1) of the Act, 21  U.S.C. section 360bbb-3(b)(1), unless the authorization is  terminated or revoked. Performed at [facility], [place] [address] [place],  [address]        Radiology Studies: No results found.   Scheduled Meds: . amLODipine  5 mg Oral Daily  . aspirin EC  81 mg Oral Daily  . carvedilol  25 mg Oral BID  . cholecalciferol  1,000 Units Oral Daily  . dexamethasone (DECADRON) injection  6 mg Intravenous Q24H  . enoxaparin (LOVENOX) injection  60 mg Subcutaneous Q24H  . losartan  100 mg Oral Daily  . mouth rinse  15 mL Mouth Rinse BID  . multivitamin with minerals  1 tablet Oral Daily  . pneumococcal 23 valent vaccine  0.5 mL Intramuscular Tomorrow-1000  . sodium chloride flush  3 mL Intravenous Q12H  . sodium chloride flush  3 mL Intravenous Q12H  . zinc sulfate  220 mg Oral Daily   Continuous Infusions: . sodium chloride    . remdesivir 100 mg in NS 100 mL 100 mg (01/10/19  0955)     LOS: 3 days   Time spent: 25 minutes  Arma Heading, MD [facility]  If 7PM-7AM, please contact night-coverage [email or web address] Password [facility] 01/10/2019, 10:33 AM

## 2019-01-10 NOTE — Plan of Care (Signed)

## 2019-01-11 LAB — CBC WITH DIFFERENTIAL/PLATELET
Abs Immature Granulocytes: 0.11 10*3/uL — ABNORMAL HIGH (ref 0.00–0.07)
Basophils Absolute: 0 10*3/uL (ref 0.0–0.1)
Basophils Relative: 0 %
Eosinophils Absolute: 0 10*3/uL (ref 0.0–0.5)
Eosinophils Relative: 0 %
HCT: 32.7 % — ABNORMAL LOW (ref 36.0–46.0)
Hemoglobin: 10.6 g/dL — ABNORMAL LOW (ref 12.0–15.0)
Immature Granulocytes: 1 %
Lymphocytes Relative: 9 %
Lymphs Abs: 1 10*3/uL (ref 0.7–4.0)
MCH: 28.6 pg (ref 26.0–34.0)
MCHC: 32.4 g/dL (ref 30.0–36.0)
MCV: 88.1 fL (ref 80.0–100.0)
Monocytes Absolute: 0.6 10*3/uL (ref 0.1–1.0)
Monocytes Relative: 6 %
Neutro Abs: 9.6 10*3/uL — ABNORMAL HIGH (ref 1.7–7.7)
Neutrophils Relative %: 84 %
Platelets: 382 10*3/uL (ref 150–400)
RBC: 3.71 MIL/uL — ABNORMAL LOW (ref 3.87–5.11)
RDW: 12.9 % (ref 11.5–15.5)
WBC: 11.3 10*3/uL — ABNORMAL HIGH (ref 4.0–10.5)
nRBC: 0 % (ref 0.0–0.2)

## 2019-01-11 LAB — COMPREHENSIVE METABOLIC PANEL
ALT: 61 U/L — ABNORMAL HIGH (ref 0–44)
AST: 34 U/L (ref 15–41)
Albumin: 2.8 g/dL — ABNORMAL LOW (ref 3.5–5.0)
Alkaline Phosphatase: 57 U/L (ref 38–126)
Anion gap: 6 (ref 5–15)
BUN: 26 mg/dL — ABNORMAL HIGH (ref 8–23)
CO2: 24 mmol/L (ref 22–32)
Calcium: 8.7 mg/dL — ABNORMAL LOW (ref 8.9–10.3)
Chloride: 108 mmol/L (ref 98–111)
Creatinine, Ser: 0.77 mg/dL (ref 0.44–1.00)
GFR calc Af Amer: >60 mL/min (ref >60)
GFR calc non Af Amer: >60 mL/min (ref >60)
Glucose, Bld: 160 mg/dL — ABNORMAL HIGH (ref 70–99)
Potassium: 4.3 mmol/L (ref 3.5–5.1)
Sodium: 138 mmol/L (ref 135–145)
Total Bilirubin: 0.8 mg/dL (ref 0.3–1.2)
Total Protein: 6.9 g/dL (ref 6.5–8.1)

## 2019-01-11 LAB — C-REACTIVE PROTEIN: CRP: 2.6 mg/dL — ABNORMAL HIGH (ref <1.0)

## 2019-01-11 LAB — MAGNESIUM: Magnesium: 2.2 mg/dL (ref 1.7–2.4)

## 2019-01-11 LAB — D-DIMER, QUANTITATIVE: D-Dimer, Quant: 0.85 ug/mL-FEU — ABNORMAL HIGH (ref 0.00–0.50)

## 2019-01-11 LAB — FERRITIN: Ferritin: 421 ng/mL — ABNORMAL HIGH (ref 11–307)

## 2019-01-11 MED ORDER — HYDROCOD POLST-CPM POLST ER 10-8 MG/5ML PO SUER
5.0000 mL | Freq: Two times a day (BID) | ORAL | Status: DC | PRN
  Administered 2019-01-11 – 2019-01-12 (×2): 5 mL via ORAL
  Filled 2019-01-11 (×2): qty 5

## 2019-01-11 NOTE — Progress Notes (Signed)
PROGRESS NOTE    Deborah Holmes [facility]  [id number] DOB: 1950/03/30 DOA: 01/07/2019 PCP: Patient, No Pcp Per   Brief Narrative: H&P per Dr. Roel Cluck, Deborah Holmes is a 69 y.o. female with medical history significant of HTN, chronic combined systolic diastolic CHF, ANEMIA. Presented with  cold-like symptoms since 28 December complains of cough lightheadedness fevers she cannot taste or smell food.  Endorses nausea vomiting diarrhea. Noted to be satting 85% on room air started on 2 L of O2 improved to oxygen saturation durations up to 92. She was exposed to her sister on thanksgiving who tested positive for Cvoid. Her Husband has been sick too.  In the ED, Covid test was positive. Patient initially was given a dose of Rocephin/azithro. Started on remdesivir, steroids.  Interim History Has remained off antibiotics. Has been tolerating remdesivir and steroids. Has remained intermittently on 2-4L oxygen. Has yet to wean. Outpatient PT/RN setup.   Assessment & Plan:   Active Problems:   Anemia   Hypertension   Chronic combined systolic (congestive) and diastolic (congestive) heart failure (HCC)   Obesity, Class III, BMI 40-49.9 (morbid obesity) ([place])   Suspected COVID-19 virus infection   Acute respiratory failure due to COVID-19 (HCC)  Hypoxic Respiratory Failure, Covid 19 PNA Secondary to Covid 19 PNA. Currently on 4L. Continue supportive measures. Hold ABX.  Weaned no room air this AM, but back on 3L. Labs stable - Decadron 6mg  day 5/10 - remdesivir day 5/5 - Given actemra  - oxygen as needed, wean as tolerated - hold abx, follow cultures - dvt ppx - IS, good bronchial hygeiene  HTN - hold home medications if becomes hypotensive - Continue losartan and norvasc given elevated BP - continue coreg - Labetalol as needed   Obesity - Continue to monitor  Hx of CHF, diastolic dysfunction - Hold lasix and do not give fluids, assess fluid status, give lasix as needed  DVT  prophylaxis: Lovenox Code Status: DNR Family Communication: Updated patient and nursing at bedside Disposition Plan: Home when medically stable   Subjective: No complaints today. Feels about the same. Is encouraged by being down to 3L of oxygen today. Remains ono xygen. Will get up to the chair today. Otherwise is doing well. No acute changes. Slowly weaning oxygen.   Objective: Vitals:   01/10/19 1243 01/10/19 2115 01/11/19 0330 01/11/19 0800  BP: (!) 110/58 118/67 (!) 147/78   Pulse: 65 82 69   Resp: 20  20   Temp: 98.4 F (36.9 C) (!) 97.1 F (36.2 C) 98.7 F (37.1 C)   TempSrc: Oral Oral Oral   SpO2: 94% 91% 94% 94%  Weight:      Height:        Intake/Output Summary (Last 24 hours) at 01/11/2019 1109 Last data filed at 01/10/2019 2126 Gross per 24 hour  Intake 450.26 ml  Output -  Net 450.26 ml   Filed Weights   01/07/19 1646 01/08/19 0759  Weight: 115.7 kg 113.9 kg    Examination:  General exam: Appears calm and comfortable. Exam unchanged. Sitting up in bed Respiratory system: CTAB. Respiratory effort normal. No change.  Cardiovascular system: S1 & S2 heard, RRR. No pedal edema. Gastrointestinal system: Abdomen is nondistended, soft and nontender. No organomegaly or masses felt. Normal bowel sounds heard. Central nervous system: Alert and oriented. No focal neurological deficits. Extremities: Symmetric 5 x 5 power. Skin: No rashes, lesions or ulcers Psychiatry: Judgement and insight appear normal. Mood & affect appropriate.  Data Reviewed: I have personally reviewed following labs and imaging studies  CBC: Recent Labs  Lab 01/07/19 1709 01/08/19 0628 01/09/19 0244 01/10/19 0246 01/11/19 0235  WBC 7.1 5.1 5.8 10.0 11.3*  NEUTROABS 5.9 4.4 4.8 8.6* 9.6*  HGB 10.7* 10.0* 10.5* 10.7* 10.6*  HCT 34.0* 31.2* 33.6* 35.4* 32.7*  MCV 88.8 89.1 91.1 91.0 88.1  PLT 205 218 191 242 [id number]   Basic Metabolic Panel: Recent Labs  Lab 01/07/19 1709  01/08/19 0833 01/09/19 0244 01/10/19 0246 01/11/19 0235  NA 145 142 142 141 138  K 3.6 3.9 5.0 4.6 4.3  CL 107 110 110 110 108  CO2 27 23 23  20* 24  GLUCOSE 132* 155* 154* 146* 160*  BUN 15 17 20  24* 26*  CREATININE 1.06* 0.94 0.80 0.84 0.77  CALCIUM 9.0 8.8* 9.0 8.9 8.7*  MG  --  2.0 2.2 2.3 2.2   GFR: Estimated Creatinine Clearance: 86.2 mL/min (by C-G formula based on SCr of 0.77 mg/dL). Liver Function Tests: Recent Labs  Lab 01/07/19 1709 01/08/19 0833 01/09/19 0244 01/10/19 0246 01/11/19 0235  AST 32 25 27 56* 34  ALT 18 18 18  58* 61*  ALKPHOS 54 52 47 57 57  BILITOT 1.0 0.8 0.9 0.6 0.8  PROT 8.1 7.7 7.2 7.2 6.9  ALBUMIN 3.5 3.3* 2.9* 2.9* 2.8*   No results for input(s): LIPASE, AMYLASE in the last 168 hours. No results for input(s): AMMONIA in the last 168 hours. Coagulation Profile: Recent Labs  Lab 01/07/19 1709  INR 1.0   Cardiac Enzymes: No results for input(s): CKTOTAL, CKMB, CKMBINDEX, TROPONINI in the last 168 hours. BNP (last 3 results) No results for input(s): PROBNP in the last 8760 hours. HbA1C: No results for input(s): HGBA1C in the last 72 hours. CBG: No results for input(s): GLUCAP in the last 168 hours. Lipid Profile: No results for input(s): CHOL, HDL, LDLCALC, TRIG, CHOLHDL, LDLDIRECT in the last 72 hours. Thyroid Function Tests: No results for input(s): TSH, T4TOTAL, FREET4, T3FREE, THYROIDAB in the last 72 hours. Anemia Panel: Recent Labs    01/10/19 0246 01/11/19 0235  FERRITIN 537* 421*   Sepsis Labs: Recent Labs  Lab 01/07/19 1709 01/07/19 2043  PROCALCITON <0.10  --   LATICACIDVEN 1.1 0.9    Recent Results (from the past 240 hour(s))  Culture, blood (Routine x 2)     Status: None (Preliminary result)   Collection Time: 01/07/19  4:45 PM   Specimen: BLOOD RIGHT FOREARM  Result Value Ref Range Status   Specimen Description   Final    BLOOD RIGHT FOREARM Performed at [facility] [address] [place], [place] [address]    Special Requests   Final    BOTTLES DRAWN AEROBIC ONLY Blood Culture adequate volume Performed at [facility] [address], [place], [place] [address]    Culture   Final    NO GROWTH 3 DAYS Performed at [facility], [place] [address], [place], [place] [address]    Report Status PENDING  Incomplete  SARS CORONAVIRUS 2 (TAT 6-24 HRS) Nasopharyngeal Nasopharyngeal Swab     Status: Abnormal   Collection Time: 01/07/19  5:10 PM   Specimen: Nasopharyngeal Swab  Result Value Ref Range Status   SARS Coronavirus 2 POSITIVE (A) NEGATIVE Final    Comment: RESULT CALLED TO, READ BACK BY AND VERIFIED WITH: J.Everlean Patterson [id number] 01/08/19 G.MCADOO (NOTE) SARS-CoV-2 target nucleic acids are DETECTED. The SARS-CoV-2 RNA is generally detectable  in upper and lower respiratory specimens during the acute phase of infection. Positive results are indicative of the presence of SARS-CoV-2 RNA. Clinical correlation with patient history and other diagnostic information is  necessary to determine patient infection status. Positive results do not rule out bacterial infection or co-infection with other viruses.  The expected result is Negative. Fact Sheet for Patients: SugarRoll.be Fact Sheet for Healthcare Providers: https://www.woods-mathews.com/ This test is not yet approved or cleared by the [place] FDA and  has been authorized for detection and/or diagnosis of SARS-CoV-2 by FDA under an Emergency Use Authorization (EUA). This EUA will remain  in effect (meaning this test can be used) for th e duration of the COVID-19 declaration under Section 564(b)(1) of the Act, 21 U.S.C. section 360bbb-3(b)(1), unless the authorization is terminated or revoked sooner. Performed at [facility], [place] [address], [place], [place] [address]   Culture, blood (Routine x 2)     Status: None (Preliminary result)    Collection Time: 01/07/19  5:15 PM   Specimen: BLOOD LEFT FOREARM  Result Value Ref Range Status   Specimen Description   Final    BLOOD LEFT FOREARM Performed at [facility] [address], [place], [place] [address]    Special Requests   Final    BOTTLES DRAWN AEROBIC AND ANAEROBIC Blood Culture results may not be optimal due to an inadequate volume of blood received in culture bottles Performed at [facility] [address] [place], [place] [address]    Culture   Final    NO GROWTH 3 DAYS Performed at [facility], [place] [address], [place], [place] [address]    Report Status PENDING  Incomplete  Respiratory Panel by RT PCR (Flu A&B, Covid) - Nasopharyngeal Swab     Status: Abnormal   Collection Time: 01/07/19  8:43 PM   Specimen: Nasopharyngeal Swab  Result Value Ref Range Status   SARS Coronavirus 2 by RT PCR POSITIVE (A) NEGATIVE Final    Comment: CRITICAL RESULT CALLED TO, READ BACK BY AND VERIFIED WITH: RN Alanson Aly AT 0302 01/08/19 CRUICKSHANK A (NOTE) SARS-CoV-2 target nucleic acids are DETECTED. SARS-CoV-2 RNA is generally detectable in upper respiratory specimens  during the acute phase of infection. Positive results are indicative of the presence of the identified virus, but do not rule out bacterial infection or co-infection with other pathogens not detected by the test. Clinical correlation with patient history and other diagnostic information is necessary to determine patient infection status. The expected result is Negative. Fact Sheet for Patients:  PinkCheek.be Fact Sheet for Healthcare Providers: GravelBags.it This test is not yet approved or cleared by the [place] FDA and  has been authorized for detection and/or diagnosis of SARS-CoV-2 by FDA under an Emergency Use Authorization (EUA).  This EUA will remain in effect (meaning this t est can be  used) for the duration of  the COVID-19 declaration under Section 564(b)(1) of the Act, 21 U.S.C. section 360bbb-3(b)(1), unless the authorization is terminated or revoked sooner.    Influenza A by PCR NEGATIVE NEGATIVE Final   Influenza B by PCR NEGATIVE NEGATIVE Final    Comment: (NOTE) The Xpert Xpress SARS-CoV-2/FLU/RSV assay is intended as an aid in  the diagnosis of influenza from Nasopharyngeal swab specimens and  should not be used as a sole basis for treatment. Nasal washings and  aspirates are unacceptable for Xpert Xpress SARS-CoV-2/FLU/RSV  testing. Fact Sheet for Patients: PinkCheek.be Fact  Sheet for Healthcare Providers: GravelBags.it This test is not yet approved or cleared by the [place] and  has been authorized for detection and/or diagnosis of SARS-CoV-2 by  FDA under an Emergency Use Authorization (EUA). This EUA will remain  in effect (meaning this test can be used) for the duration of the  Covid-19 declaration under Section 564(b)(1) of the Act, 21  U.S.C. section 360bbb-3(b)(1), unless the authorization is  terminated or revoked. Performed at [facility], [place] [address], [place], [place] [address]        Radiology Studies: No results found.   Scheduled Meds: . amLODipine  5 mg Oral Daily  . aspirin EC  81 mg Oral Daily  . carvedilol  25 mg Oral BID  . cholecalciferol  1,000 Units Oral Daily  . dexamethasone (DECADRON) injection  6 mg Intravenous Q24H  . enoxaparin (LOVENOX) injection  60 mg Subcutaneous Q24H  . losartan  100 mg Oral Daily  . mouth rinse  15 mL Mouth Rinse BID  . multivitamin with minerals  1 tablet Oral Daily  . sodium chloride flush  3 mL Intravenous Q12H  . sodium chloride flush  3 mL Intravenous Q12H  . zinc sulfate  220 mg Oral Daily   Continuous Infusions: . sodium chloride    . remdesivir 100 mg in NS 100 mL 100 mg (01/10/19 0955)      LOS: 4 days   Time spent:  25 minutes  Arma Heading, MD [facility]  If 7PM-7AM, please contact night-coverage [email or web address] Password [email or web address] 01/11/2019, 11:09 AM

## 2019-01-11 NOTE — Plan of Care (Signed)
  Problem: Health Behavior/Discharge Planning: Goal: Ability to manage health-related needs will improve Outcome: Progressing   Problem: Clinical Measurements: Goal: Diagnostic test results will improve Outcome: Progressing Goal: Respiratory complications will improve Outcome: Progressing   

## 2019-01-11 NOTE — Plan of Care (Signed)
Patient maintains oxygen saturation in the low to mid 90's on 3 liters, denies dyspnea at rest or with exertion.  Patient sat up in chair for most of shift.

## 2019-01-12 DIAGNOSIS — I10 Essential (primary) hypertension: Secondary | ICD-10-CM

## 2019-01-12 DIAGNOSIS — J9600 Acute respiratory failure, unspecified whether with hypoxia or hypercapnia: Secondary | ICD-10-CM

## 2019-01-12 DIAGNOSIS — U071 COVID-19: Principal | ICD-10-CM

## 2019-01-12 DIAGNOSIS — I5042 Chronic combined systolic (congestive) and diastolic (congestive) heart failure: Secondary | ICD-10-CM

## 2019-01-12 LAB — COMPREHENSIVE METABOLIC PANEL
ALT: 48 U/L — ABNORMAL HIGH (ref 0–44)
AST: 19 U/L (ref 15–41)
Albumin: 2.7 g/dL — ABNORMAL LOW (ref 3.5–5.0)
Alkaline Phosphatase: 57 U/L (ref 38–126)
Anion gap: 8 (ref 5–15)
BUN: 23 mg/dL (ref 8–23)
CO2: 25 mmol/L (ref 22–32)
Calcium: 8.8 mg/dL — ABNORMAL LOW (ref 8.9–10.3)
Chloride: 105 mmol/L (ref 98–111)
Creatinine, Ser: 0.82 mg/dL (ref 0.44–1.00)
GFR calc Af Amer: >60 mL/min (ref >60)
GFR calc non Af Amer: >60 mL/min (ref >60)
Glucose, Bld: 173 mg/dL — ABNORMAL HIGH (ref 70–99)
Potassium: 4.6 mmol/L (ref 3.5–5.1)
Sodium: 138 mmol/L (ref 135–145)
Total Bilirubin: 0.6 mg/dL (ref 0.3–1.2)
Total Protein: 6.4 g/dL — ABNORMAL LOW (ref 6.5–8.1)

## 2019-01-12 LAB — CBC WITH DIFFERENTIAL/PLATELET
Abs Immature Granulocytes: 0.16 10*3/uL — ABNORMAL HIGH (ref 0.00–0.07)
Basophils Absolute: 0 10*3/uL (ref 0.0–0.1)
Basophils Relative: 0 %
Eosinophils Absolute: 0 10*3/uL (ref 0.0–0.5)
Eosinophils Relative: 0 %
HCT: 32.4 % — ABNORMAL LOW (ref 36.0–46.0)
Hemoglobin: 10.3 g/dL — ABNORMAL LOW (ref 12.0–15.0)
Immature Granulocytes: 1 %
Lymphocytes Relative: 8 %
Lymphs Abs: 1.1 10*3/uL (ref 0.7–4.0)
MCH: 28.1 pg (ref 26.0–34.0)
MCHC: 31.8 g/dL (ref 30.0–36.0)
MCV: 88.3 fL (ref 80.0–100.0)
Monocytes Absolute: 0.7 10*3/uL (ref 0.1–1.0)
Monocytes Relative: 6 %
Neutro Abs: 10.9 10*3/uL — ABNORMAL HIGH (ref 1.7–7.7)
Neutrophils Relative %: 85 %
Platelets: 423 10*3/uL — ABNORMAL HIGH (ref 150–400)
RBC: 3.67 MIL/uL — ABNORMAL LOW (ref 3.87–5.11)
RDW: 12.7 % (ref 11.5–15.5)
WBC: 12.9 10*3/uL — ABNORMAL HIGH (ref 4.0–10.5)
nRBC: 0 % (ref 0.0–0.2)

## 2019-01-12 LAB — CULTURE, BLOOD (ROUTINE X 2)
Culture: NO GROWTH
Culture: NO GROWTH
Special Requests: ADEQUATE

## 2019-01-12 LAB — D-DIMER, QUANTITATIVE: D-Dimer, Quant: 0.83 ug/mL-FEU — ABNORMAL HIGH (ref 0.00–0.50)

## 2019-01-12 LAB — MAGNESIUM: Magnesium: 2.2 mg/dL (ref 1.7–2.4)

## 2019-01-12 LAB — FERRITIN: Ferritin: 403 ng/mL — ABNORMAL HIGH (ref 11–307)

## 2019-01-12 LAB — C-REACTIVE PROTEIN: CRP: 1.5 mg/dL — ABNORMAL HIGH (ref <1.0)

## 2019-01-12 MED ORDER — ZINC SULFATE 220 (50 ZN) MG PO CAPS: 220.0000 mg | ORAL_CAPSULE | Freq: Every day | ORAL | 0 refills | Status: AC

## 2019-01-12 MED ORDER — DEXAMETHASONE 6 MG PO TABS: 6.0000 mg | ORAL_TABLET | Freq: Every day | ORAL | 0 refills | Status: AC

## 2019-01-12 MED ORDER — ADULT MULTIVITAMIN W/MINERALS CH: 1.0000 | ORAL_TABLET | Freq: Every day | ORAL | 0 refills | Status: AC

## 2019-01-12 MED ORDER — GUAIFENESIN-DM 100-10 MG/5ML PO SYRP: 10.0000 mL | ORAL_SOLUTION | ORAL | 0 refills | Status: DC | PRN

## 2019-01-12 MED ORDER — VITAMIN D3 25 MCG PO TABS: 1000.0000 [IU] | ORAL_TABLET | Freq: Every day | ORAL | 1 refills | Status: DC

## 2019-01-12 NOTE — Progress Notes (Signed)
Patient discharged home.  IV removed - WNL.  Reviewed AVS and medications - patient verbalizes understanding.  Telephone follow up in place.  Home O2 arranged per CM and patient instructed on how to use portable tank.  Sent home with patient.  COVID guidelines to reduce spread of infection reviewed with patient and handout given.  Frequent handwashing and sanitizing of surfaces encouraged at home.  All questions answered satisfactorily.  Patient assisted off unit by NT in NAD and O2 in place.

## 2019-01-12 NOTE — Discharge Summary (Signed)
Physician Discharge Summary  Deborah Holmes [facility] [id number] DOB: [birth year] DOA: 01/07/2019  PCP: Patient, No Pcp Per  Admit date: 01/07/2019 Discharge date: 01/12/2019 Consultations: None Admitted From: home Disposition: home  Discharge Diagnoses:  Principal Problem:   Acute respiratory failure due to COVID-19 [facility]) Active Problems:   Anemia   Hypertension   Chronic combined systolic (congestive) and diastolic (congestive) heart failure (HCC)   Obesity, Class III, BMI 40-49.9 (morbid obesity) [facility])   Hospital Course Summary: 69 y.o.femalewith medical history significant of HTN,chronic combined systolic/ diastolic CHF, mild anemia presented with cold-like symptoms cough, fevers, loss of taste/smell since 12/28. Also reported nausea vomiting diarrhea.  She was exposed to her sister on thanksgiving who tested positive for Cvoid. Her Husband has been sick too. ED Course: Afebrile,noted to be satting 85% on room air- started on 2 L O2 in the ED, Covid test was positive. Labs showed nl BMP, creat 1.06. CBC-- nl WBC, hg 10.7( close to baseline),nl Plt. Procalcitonin level <0.10, lactate 1.1. CXR showed multifocal airspace opacities consistent with viral pneumonia.  Patient initially was given a dose of Rocephin/azithro. Started on remdesivir, steroids. Hospital course: Patient admitted to [facility] for further management. HC complicated by persistent hypoxia and 02 requirement.   1. COVID-19 pneumonia with acute hypoxic respiratory failure: POA. Patient hypoxic on presentation and was requiring 4 lits 02. Decadron 6mg  day6/10-completed remdesivir x 5 days- Given Actemra. He was supplemenatal oxygen as needed, slowly weaned to RA today and is saturating >90% at rest but her oxygen saturation appears to be dropping to low 80s with minimal exertion. Will arrange for Home 02 to be used on exertion until PCP follow up and re-evaluation. She will be discharged with prescription for oral decadron to  complete 10 day course.   2. HTN -- Resumed losartan and norvasc given elevated BP, continue coreg. Labetalol as needed available during hospital course  3. Hx of CHF, diastolic dysfunction- compensated. May resume b-blockers/ACEI/ Lasix on discharge  4.Obesity-  Counseled regarding benefits of weight loss, recommend PCP f/u  Discharge Exam:  Vitals:   01/12/19 0602 01/12/19 1315  BP: 128/78 (!) 110/57  Pulse: 63 67  Resp: 19 19  Temp: 97.7 F (36.5 C) (!) 97.4 F (36.3 C)  SpO2: 97% 93%   Vitals:   01/11/19 1236 01/11/19 1955 01/12/19 0602 01/12/19 1315  BP: 129/79 111/66 128/78 (!) 110/57  Pulse: 66 75 63 67  Resp: 20  19 19   Temp: (!) 97.2 F (36.2 C) 97.6 F (36.4 C) 97.7 F (36.5 C) (!) 97.4 F (36.3 C)  TempSrc: Oral Oral Oral Oral  SpO2: 94% 91% 97% 93%  Weight:      Height:        General: Pt is alert, awake, not in acute distress Cardiovascular: RRR, S1/S2 +, no rubs, no gallops Respiratory: CTA bilaterally, no wheezing, no rhonchi Abdominal: Soft, NT, ND, bowel sounds + Extremities: no edema, no cyanosis  Discharge Condition:Stable CODE STATUS: DNR Diet recommendation: low salt , heart healthy Recommendations for Outpatient Follow-up:  1. Follow up with PCP: 5-7 days 2. Follow up with consultants:  3. Please obtain follow up labs including: [place] services upon discharge: n/a Equipment/Devices upon discharge:Home O2 2lits with exertion   Discharge Instructions:  Discharge Instructions    Call MD for:  difficulty breathing, headache or visual disturbances   Complete by: As directed    Call MD for:  extreme fatigue   Complete by: As directed  Call MD for:  temperature >100.4   Complete by: As directed    Diet - low sodium heart healthy   Complete by: As directed    Increase activity slowly   Complete by: As directed      Allergies as of 01/12/2019   No Known Allergies     Medication List    STOP taking these medications    meloxicam 7.5 MG tablet Commonly known as: MOBIC   THERAFLU FLU/COLD PO     TAKE these medications   amLODipine 5 MG tablet Commonly known as: NORVASC Take 5 mg by mouth daily.   aspirin EC 81 MG tablet Take 81 mg by mouth daily.   carvedilol 25 MG tablet Commonly known as: COREG Take 25 mg by mouth 2 (two) times daily.   cyanocobalamin 1000 MCG tablet Take 1 tablet (1,000 mcg total) by mouth daily.   dexamethasone 6 MG tablet Commonly known as: DECADRON Take 1 tablet (6 mg total) by mouth daily for 5 days.   furosemide 20 MG tablet Commonly known as: LASIX Take 1 tablet (20 mg total) by mouth daily.   guaiFENesin-dextromethorphan 100-10 MG/5ML syrup Commonly known as: ROBITUSSIN DM Take 10 mLs by mouth every 4 (four) hours as needed for cough.   HYDROcodone-acetaminophen 5-325 MG tablet Commonly known as: NORCO/VICODIN Take 1 tablet by mouth every 4 (four) hours as needed.   losartan 100 MG tablet Commonly known as: COZAAR Take 100 mg by mouth daily.   multivitamin with minerals Tabs tablet Take 1 tablet by mouth daily. Start taking on: January 13, 2019   Vitamin D3 25 MCG tablet Commonly known as: Vitamin D Take 1 tablet (1,000 Units total) by mouth daily. Start taking on: January 13, 2019   zinc sulfate 220 (50 Zn) MG capsule Take 1 capsule (220 mg total) by mouth daily for 5 days. Start taking on: January 13, 2019            Durable Medical Equipment  (From admission, onward)         Start     Ordered   01/12/19 1539  For home use only DME oxygen  Once    Question Answer Comment  Length of Need 6 Months   Mode or (Route) Nasal cannula   Liters per Minute 2   Frequency Continuous (stationary and portable oxygen unit needed)   Oxygen conserving device Yes   Oxygen delivery system Gas      01/12/19 1538         Follow-up Information    Home[facility] Follow up.   Specialty: Home Health Services Why: Will call 48 hours after  dischrge. Please feel free to call [facility]. Contact information: [address] [place] [address] [phone]        [facility] Follow up.   Why: Will supply home  Contact information: [place] [address] [phone]          No Known Allergies    The results of significant diagnostics from this hospitalization (including imaging, microbiology, ancillary and laboratory) are listed below for reference.    Labs: BNP (last 3 results) No results for input(s): BNP in the last 8760 hours. Basic Metabolic Panel: Recent Labs  Lab 01/08/19 0833 01/09/19 0244 01/10/19 0246 01/11/19 0235 01/12/19 0259  NA 142 142 141 138 138  K 3.9 5.0 4.6 4.3 4.6  CL 110 110 110 108 105  CO2 23 23 20* 24  25  GLUCOSE 155* 154* 146* 160* 173*  BUN 17 20 24* 26* 23  CREATININE 0.94 0.80 0.84 0.77 0.82  CALCIUM 8.8* 9.0 8.9 8.7* 8.8*  MG 2.0 2.2 2.3 2.2 2.2   Liver Function Tests: Recent Labs  Lab 01/08/19 0833 01/09/19 0244 01/10/19 0246 01/11/19 0235 01/12/19 0259  AST 25 27 56* 34 19  ALT 18 18 58* 61* 48*  ALKPHOS 52 47 57 57 57  BILITOT 0.8 0.9 0.6 0.8 0.6  PROT 7.7 7.2 7.2 6.9 6.4*  ALBUMIN 3.3* 2.9* 2.9* 2.8* 2.7*   No results for input(s): LIPASE, AMYLASE in the last 168 hours. No results for input(s): AMMONIA in the last 168 hours. CBC: Recent Labs  Lab 01/08/19 0628 01/09/19 0244 01/10/19 0246 01/11/19 0235 01/12/19 0259  WBC 5.1 5.8 10.0 11.3* 12.9*  NEUTROABS 4.4 4.8 8.6* 9.6* 10.9*  HGB 10.0* 10.5* 10.7* 10.6* 10.3*  HCT 31.2* 33.6* 35.4* 32.7* 32.4*  MCV 89.1 91.1 91.0 88.1 88.3  PLT 218 191 242 382 423*   Cardiac Enzymes: No results for input(s): CKTOTAL, CKMB, CKMBINDEX, TROPONINI in the last 168 hours. BNP: Invalid input(s): POCBNP CBG: No results for input(s): GLUCAP in the last 168 hours. D-Dimer Recent Labs    01/11/19 0235 01/12/19 0259  DDIMER 0.85* 0.83*   Hgb A1c No results for  input(s): HGBA1C in the last 72 hours. Lipid Profile No results for input(s): CHOL, HDL, LDLCALC, TRIG, CHOLHDL, LDLDIRECT in the last 72 hours. Thyroid function studies No results for input(s): TSH, T4TOTAL, T3FREE, THYROIDAB in the last 72 hours.  Invalid input(s): FREET3 Anemia work up Recent Labs    01/11/19 0235 01/12/19 0259  FERRITIN 421* 403*   Urinalysis    Component Value Date/Time   COLORURINE AMBER (A) 01/08/2019 1059   APPEARANCEUR CLEAR 01/08/2019 1059   APPEARANCEUR Cloudy 01/16/2011 1949   LABSPEC 1.029 01/08/2019 1059   LABSPEC 1.028 01/16/2011 1949   PHURINE 5.0 01/08/2019 1059   GLUCOSEU NEGATIVE 01/08/2019 1059   GLUCOSEU Negative 01/16/2011 1949   HGBUR NEGATIVE 01/08/2019 1059   BILIRUBINUR NEGATIVE 01/08/2019 1059   BILIRUBINUR Negative 01/16/2011 1949   KETONESUR NEGATIVE 01/08/2019 1059   PROTEINUR 100 (A) 01/08/2019 1059   NITRITE NEGATIVE 01/08/2019 1059   LEUKOCYTESUR NEGATIVE 01/08/2019 1059   LEUKOCYTESUR 3+ 01/16/2011 1949   Sepsis Labs Invalid input(s): PROCALCITONIN,  WBC,  LACTICIDVEN Microbiology Recent Results (from the past 240 hour(s))  Culture, blood (Routine x 2)     Status: None   Collection Time: 01/07/19  4:45 PM   Specimen: BLOOD RIGHT FOREARM  Result Value Ref Range Status   Specimen Description   Final    BLOOD RIGHT FOREARM Performed at [facility], [place] [address], [place], [place] [address]    Special Requests   Final    BOTTLES DRAWN AEROBIC ONLY Blood Culture adequate volume Performed at [place] [address] [place], [place] [address]    Culture   Final    NO GROWTH 5 DAYS Performed at [facility], [place] [address] [place], [place] [address]    Report Status 01/12/2019 FINAL  Final  SARS CORONAVIRUS 2 (TAT 6-24 HRS) Nasopharyngeal Nasopharyngeal Swab     Status: Abnormal   Collection Time: 01/07/19  5:10 PM   Specimen: Nasopharyngeal Swab  Result Value Ref  Range Status   SARS Coronavirus 2 POSITIVE (A) NEGATIVE Final    Comment: RESULT CALLED TO, READ BACK BY AND VERIFIED  WITH: J.Everlean Patterson [id number] 01/08/19 G.MCADOO (NOTE) SARS-CoV-2 target nucleic acids are DETECTED. The SARS-CoV-2 RNA is generally detectable in upper and lower respiratory specimens during the acute phase of infection. Positive results are indicative of the presence of SARS-CoV-2 RNA. Clinical correlation with patient history and other diagnostic information is  necessary to determine patient infection status. Positive results do not rule out bacterial infection or co-infection with other viruses.  The expected result is Negative. Fact Sheet for Patients: SugarRoll.be Fact Sheet for Healthcare Providers: https://www.woods-mathews.com/ This test is not yet approved or cleared by the [place] FDA and  has been authorized for detection and/or diagnosis of SARS-CoV-2 by FDA under an Emergency Use Authorization (EUA). This EUA will remain  in effect (meaning this test can be used) for th e duration of the COVID-19 declaration under Section 564(b)(1) of the Act, 21 U.S.C. section 360bbb-3(b)(1), unless the authorization is terminated or revoked sooner. Performed at [facility], [place] [address], [place], [place] [address]   Culture, blood (Routine x 2)     Status: None   Collection Time: 01/07/19  5:15 PM   Specimen: BLOOD LEFT FOREARM  Result Value Ref Range Status   Specimen Description   Final    BLOOD LEFT FOREARM Performed at [place] [address] [place], [place] [address]    Special Requests   Final    BOTTLES DRAWN AEROBIC AND ANAEROBIC Blood Culture results may not be optimal due to an inadequate volume of blood received in culture bottles Performed at [facility] [address], [place], [place] [address]    Culture   Final    NO GROWTH 5 DAYS Performed at [facility], [place] [address], [place], [place] [address]    Report Status 01/12/2019 FINAL  Final  Respiratory Panel by RT PCR (Flu A&B, Covid) - Nasopharyngeal Swab     Status: Abnormal   Collection Time: 01/07/19  8:43 PM   Specimen: Nasopharyngeal Swab  Result Value Ref Range Status   SARS Coronavirus 2 by RT PCR POSITIVE (A) NEGATIVE Final    Comment: CRITICAL RESULT CALLED TO, READ BACK BY AND VERIFIED WITH: RN Alanson Aly AT 0302 01/08/19 CRUICKSHANK A (NOTE) SARS-CoV-2 target nucleic acids are DETECTED. SARS-CoV-2 RNA is generally detectable in upper respiratory specimens  during the acute phase of infection. Positive results are indicative of the presence of the identified virus, but do not rule out bacterial infection or co-infection with other pathogens not detected by the test. Clinical correlation with patient history and other diagnostic information is necessary to determine patient infection status. The expected result is Negative. Fact Sheet for Patients:  PinkCheek.be Fact Sheet for Healthcare Providers: GravelBags.it This test is not yet approved or cleared by the [place] FDA and  has been authorized for detection and/or diagnosis of SARS-CoV-2 by FDA under an Emergency Use Authorization (EUA).  This EUA will remain in effect (meaning this t est can be used) for the duration of  the COVID-19 declaration under Section 564(b)(1) of the Act, 21 U.S.C. section 360bbb-3(b)(1), unless the authorization is terminated or revoked sooner.    Influenza A by PCR NEGATIVE NEGATIVE Final   Influenza B by PCR NEGATIVE NEGATIVE Final    Comment: (NOTE) The Xpert Xpress SARS-CoV-2/FLU/RSV assay is intended as an aid in  the diagnosis of influenza from Nasopharyngeal swab specimens and  should not be used as a sole basis for treatment. Nasal washings and  aspirates are unacceptable for Xpert Xpress SARS-CoV-2/FLU/RSV   testing. Fact Sheet for Patients: PinkCheek.be Fact Sheet for Healthcare Providers: GravelBags.it This test is not yet approved or cleared by the [place] FDA and  has been authorized for detection and/or diagnosis of SARS-CoV-2 by  FDA under an Emergency Use Authorization (EUA). This EUA will remain  in effect (meaning this test can be used) for the duration of the  Covid-19 declaration under Section 564(b)(1) of the Act, 21  U.S.C. section 360bbb-3(b)(1), unless the authorization is  terminated or revoked. Performed at [facility], [place] [address], [place], [place] [address]     Procedures/Studies: DG Chest Portable 1 View  Result Date: 01/07/2019 CLINICAL DATA:  Shortness of breath EXAM: PORTABLE CHEST 1 VIEW COMPARISON:  January 06, 2015 FINDINGS: There are scattered bilateral airspace opacities. There is no pneumothorax. No large pleural effusion. The heart size is mildly enlarged. Aortic calcifications are noted. There are degenerative changes of both glenohumeral joints. IMPRESSION: Multifocal airspace opacities consistent with the patient's history of viral pneumonia. Electronically Signed   By: Constance Holster M.D.   On: 01/07/2019 16:34    Time coordinating discharge: Over 30 minutes  SIGNED:   Guilford Shi, MD  [facility] 01/12/2019, 3:39 PM Pager : [phone]

## 2019-01-12 NOTE — Discharge Instructions (Signed)

## 2019-01-12 NOTE — TOC Progression Note (Signed)
Transition of Care [facility]) - Progression Note    Patient Details  Name: Deborah Holmes MRN: [id number] Date of Birth: [birth year]  Transition of Care [facility]) CM/SW Contact  Purcell Mouton, RN Phone Number: 01/12/2019, 3:58 PM  Clinical Narrative:    Pt discharging home with [facility] home for [facility] [employer] for home O2. One tank will be delivered to the floor.         Expected Discharge Plan and Services           Expected Discharge Date: 01/12/19                                     Social Determinants of Health (SDOH) Interventions    Readmission Risk Interventions No flowsheet data found.

## 2019-01-12 NOTE — Plan of Care (Signed)
  Problem: Clinical Measurements: Goal: Respiratory complications will improve 01/12/2019 1637 by Lella Mullany, Leitha Schuller, RN Outcome: Adequate for Discharge  Patient discharging home with O2 set up per CM.  Patient able to ambulate independently with slight SOB on 2L O2

## 2019-01-21 ENCOUNTER — Telehealth: Payer: Self-pay

## 2019-01-21 ENCOUNTER — Other Ambulatory Visit: Payer: Self-pay

## 2019-01-21 ENCOUNTER — Ambulatory Visit (INDEPENDENT_AMBULATORY_CARE_PROVIDER_SITE_OTHER): Payer: Medicare HMO | Admitting: Nurse Practitioner

## 2019-01-21 ENCOUNTER — Encounter: Payer: Self-pay | Admitting: Nurse Practitioner

## 2019-01-21 DIAGNOSIS — I5042 Chronic combined systolic (congestive) and diastolic (congestive) heart failure: Secondary | ICD-10-CM

## 2019-01-21 DIAGNOSIS — I10 Essential (primary) hypertension: Secondary | ICD-10-CM | POA: Diagnosis not present

## 2019-01-21 DIAGNOSIS — D638 Anemia in other chronic diseases classified elsewhere: Secondary | ICD-10-CM

## 2019-01-21 NOTE — Patient Instructions (Signed)
Heart Failure Eating Plan Heart failure, also called congestive heart failure, occurs when your heart does not pump blood well enough to meet your body's needs for oxygen-rich blood. Heart failure is a long-term (chronic) condition. Living with heart failure can be challenging. However, following your health care provider's instructions about a healthy lifestyle and working with a diet and nutrition specialist (dietitian) to choose the right foods may help to improve your symptoms. What are tips for following this plan? Reading food labels  Check food labels for the amount of sodium per serving. Choose foods that have less than 140 mg (milligrams) of sodium in each serving.  Check food labels for the number of calories per serving. This is important if you need to limit your daily calorie intake to lose weight.  Check food labels for the serving size. If you eat more than one serving, you will be eating more sodium and calories than what is listed on the label.  Look for foods that are labeled as "sodium-free," "very low sodium," or "low sodium." ? Foods labeled as "reduced sodium" or "lightly salted" may still have more sodium than what is recommended for you. Cooking  Avoid adding salt when cooking. Ask your health care provider or dietitian before using salt substitutes.  Season food with salt-free seasonings, spices, or herbs. Check the label of seasoning mixes to make sure they do not contain salt.  Cook with heart-healthy oils, such as olive, canola, soybean, or sunflower oil.  Do not fry foods. Cook foods using low-fat methods, such as baking, boiling, grilling, and broiling.  Limit unhealthy fats when cooking by: ? Removing the skin from poultry, such as chicken. ? Removing all visible fats from meats. ? Skimming the fat off from stews, soups, and gravies before serving them. Meal planning   Limit your intake of: ? Processed, canned, or pre-packaged foods. ? Foods that are  high in trans fat, such as fried foods. ? Sweets, desserts, sugary drinks, and other foods with added sugar. ? Full-fat dairy products, such as whole milk.  Eat a balanced diet that includes: ? 4-5 servings of fruit each day and 4-5 servings of vegetables each day. At each meal, try to fill half of your plate with fruits and vegetables. ? Up to 6-8 servings of whole grains each day. ? Up to 2 servings of lean meat, poultry, or fish each day. One serving of meat is equal to 3 oz. This is about the same size as a deck of cards. ? 2 servings of low-fat dairy each day. ? Heart-healthy fats. Healthy fats called omega-3 fatty acids are found in foods such as flaxseed and cold-water fish like sardines, salmon, and mackerel.  Aim to eat 25-35 g (grams) of fiber a day. Foods that are high in fiber include apples, broccoli, carrots, beans, peas, and whole grains.  Do not add salt or condiments that contain salt (such as soy sauce) to foods before eating.  When eating at a restaurant, ask that your food be prepared with less salt or no salt, if possible.  Try to eat 2 or more vegetarian meals each week.  Eat more home-cooked food and eat less restaurant, buffet, and fast food. General information  Do not eat more than 2,300 mg of salt (sodium) a day. The amount of sodium that is recommended for you may be lower, depending on your condition.  Maintain a healthy body weight as directed. Ask your health care provider what a healthy weight is  for you. ? Check your weight every day. ? Work with your health care provider and dietitian to make a plan that is right for you to lose weight or maintain your current weight.  Limit how much fluid you drink. Ask your health care provider or dietitian how much fluid you can have each day.  Limit or avoid alcohol as told by your health care provider or dietitian. Recommended foods The items listed may not be a complete list. Talk with your dietitian about what  dietary choices are best for you. Fruits All fresh, frozen, and canned fruits. Dried fruits, such as raisins, prunes, and cranberries. Vegetables All fresh vegetables. Vegetables that are frozen without sauce or added salt. Low-sodium or sodium-free canned vegetables. Grains Bread with less than 80 mg of sodium per slice. Whole-wheat pasta, quinoa, and brown rice. Oats and oatmeal. Barley. [place]. Grits and cream of wheat. Whole-grain and whole-wheat cold cereal. Meats and other protein foods Lean cuts of meat. Skinless chicken and [place]. Fish with high omega-3 fatty acids, such as salmon, sardines, and other cold-water fishes. Eggs. Dried beans, peas, and edamame. Unsalted nuts and nut butters. Dairy Low-fat or nonfat (skim) milk and dried milk. Rice milk, soy milk, and almond milk. Low-fat or nonfat yogurt. Small amounts of reduced-sodium block cheese. Low-sodium cottage cheese. Fats and oils Olive, canola, soybean, flaxseed, or sunflower oil. Avocado. Sweets and desserts Apple sauce. Granola bars. Sugar-free pudding and gelatin. Frozen fruit bars. Seasoning and other foods Fresh and dried herbs. Lemon or lime juice. Vinegar. Low-sodium ketchup. Salt-free marinades, salad dressings, sauces, and seasonings. The items listed above may not be a complete list of foods and beverages you can eat. Contact a dietitian for more information. Foods to avoid The items listed may not be a complete list. Talk with your dietitian about what dietary choices are best for you. Fruits Fruits that are dried with sodium-containing preservatives. Vegetables Canned vegetables. Frozen vegetables with sauce or seasonings. Creamed vegetables. [place] fries. Onion rings. Pickled vegetables and sauerkraut. Grains Bread with more than 80 mg of sodium per slice. Hot or cold cereal with more than 140 mg sodium per serving. Salted pretzels and crackers. Pre-packaged breadcrumbs. Bagels, croissants, and biscuits. Meats  and other protein foods Ribs and chicken wings. Bacon, ham, pepperoni, bologna, salami, and packaged luncheon meats. Hot dogs, bratwurst, and sausage. Canned meat. Smoked meat and fish. Salted nuts and seeds. Dairy Whole milk, half-and-half, and cream. Buttermilk. Processed cheese, cheese spreads, and cheese curds. Regular cottage cheese. Feta cheese. Shredded cheese. String cheese. Fats and oils Butter, lard, shortening, ghee, and bacon fat. Canned and packaged gravies. Seasoning and other foods Onion salt, garlic salt, table salt, and sea salt. Marinades. Regular salad dressings. Relishes, pickles, and olives. Meat flavorings and tenderizers, and bouillon cubes. Horseradish, ketchup, and mustard. Worcestershire sauce. Teriyaki sauce, soy sauce (including reduced sodium). Hot sauce and Tabasco sauce. Steak sauce, fish sauce, oyster sauce, and cocktail sauce. Taco seasonings. Barbecue sauce. Tartar sauce. The items listed above may not be a complete list of foods and beverages you should avoid. Contact a dietitian for more information. Summary  A heart failure eating plan includes changes that limit your intake of sodium and unhealthy fat, and it may help you lose weight or maintain a healthy weight. Your health care provider may also recommend limiting how much fluid you drink.  Most people with heart failure should eat no more than 2,300 mg of salt (sodium) a day. The amount of sodium  that is recommended for you may be lower, depending on your condition.  Contact your health care provider or dietitian before making any major changes to your diet. This information is not intended to replace advice given to you by your health care provider. Make sure you discuss any questions you have with your health care provider. Document Revised: 02/13/2018 Document Reviewed: 05/04/2016 Elsevier Patient Education  2020 Elsevier Inc.  

## 2019-01-21 NOTE — Assessment & Plan Note (Signed)
We will continue with current regimen follow-up in 1 to 2 months

## 2019-01-21 NOTE — Assessment & Plan Note (Signed)
Information provided on heart failure diet and exercise

## 2019-01-21 NOTE — Progress Notes (Addendum)
New Patient Office Visit  Subjective:  Patient ID: Deborah Holmes, female    DOB: 1950-11-10  Age: 69 y.o. MRN: [id number]  CC:  Chief Complaint  Patient presents with  . Establish Care    hospital follow up     HPI Deborah Holmes [facility] presents to establish care.  She was diagnosed, hospitalized and treated for acute respiratory failure secondary to COVID-19 on January 6.  She has a history of heart failure and hypertension.    She is currently amlodipine 5 mg daily, 81 mg aspirin, carvedilol 25 mg twice daily, furosemide 20 mg daily, losartan 100 mg daily.  She is not taking the potassium supplement she does take multivitamins.  She feels like she is doing well and has no concerns or questions today. She is establishing care today because she has not been able to contact her previous primary care provider. She denies any current fever, chills, headaches, dizziness, alteration in taste or smell, shortness of breath, chest pains, nausea vomiting or diarrhea.  She denies any urinary symptoms.  She denies any swelling of her legs feet or ankles.    Past Medical History:  Diagnosis Date  . CHF (congestive heart failure) ([place])   . Hypertension     Past Surgical History:  Procedure Laterality Date  . ROTATOR CUFF REPAIR      Family History  Problem Relation Age of Onset  . CAD Mother   . Sickle cell anemia Mother   . CAD Father   . Diabetes type II Father     Social History   Socioeconomic History  . Marital status: Married    Spouse name: Not on file  . Number of children: Not on file  . Years of education: Not on file  . Highest education level: Not on file  Occupational History  . Not on file  Tobacco Use  . Smoking status: Passive Smoke Exposure - Never Smoker  . Smokeless tobacco: Never Used  Substance and Sexual Activity  . Alcohol use: No  . Drug use: No  . Sexual activity: Not on file  Other Topics Concern  . Not on file  Social History Narrative  . Not on  file   Social Determinants of Health   Financial Resource Strain:   . Difficulty of Paying Living Expenses: Not on file  Food Insecurity:   . Worried About Charity fundraiser in the Last Year: Not on file  . Ran Out of Food in the Last Year: Not on file  Transportation Needs:   . Lack of Transportation (Medical): Not on file  . Lack of Transportation (Non-Medical): Not on file  Physical Activity:   . Days of Exercise per Week: Not on file  . Minutes of Exercise per Session: Not on file  Stress:   . Feeling of Stress : Not on file  Social Connections:   . Frequency of Communication with Friends and Family: Not on file  . Frequency of Social Gatherings with Friends and Family: Not on file  . Attends Religious Services: Not on file  . Active Member of Clubs or Organizations: Not on file  . Attends Archivist Meetings: Not on file  . Marital Status: Not on file  Intimate Partner Violence:   . Fear of Current or Ex-Partner: Not on file  . Emotionally Abused: Not on file  . Physically Abused: Not on file  . Sexually Abused: Not on file    ROS Review of Systems  Constitutional: Negative.   HENT: Negative.   Eyes: Negative.   Respiratory: Negative.   Cardiovascular: Negative.   Gastrointestinal: Negative.   Endocrine: Negative.   Genitourinary: Negative.   Musculoskeletal: Negative.   Skin: Negative.   Allergic/Immunologic: Negative.   Neurological: Negative.   Hematological: Negative.   Psychiatric/Behavioral: Negative.     Objective:   Today's Vitals: There were no vitals taken for this visit.  Physical Exam Televisit Patient able to carry on full conversation without shortness of breath.  Assessment & Plan:   Problem List Items Addressed This Visit      High   Anemia    Will obtain labs at next visit      Chronic combined systolic (congestive) and diastolic (congestive) heart failure (HCC)    We will continue with current regimen Heart failure  diet and exercise provided      Hypertension - Primary    We will continue with current regimen follow-up in 1 to 2 months      Obesity, Class III, BMI 40-49.9 (morbid obesity) ([place])    Information provided on heart failure diet and exercise         Outpatient Encounter Medications as of 01/21/2019  Medication Sig  . amLODipine (NORVASC) 5 MG tablet Take 5 mg by mouth daily.  Marland Kitchen aspirin EC 81 MG tablet Take 81 mg by mouth daily.  . carvedilol (COREG) 25 MG tablet Take 25 mg by mouth 2 (two) times daily.  . cholecalciferol (VITAMIN D) 25 MCG tablet Take 1 tablet (1,000 Units total) by mouth daily.  . furosemide (LASIX) 20 MG tablet Take 1 tablet (20 mg total) by mouth daily.  Marland Kitchen HYDROcodone-acetaminophen (NORCO/VICODIN) 5-325 MG tablet Take 1 tablet by mouth every 4 (four) hours as needed.  Marland Kitchen losartan (COZAAR) 100 MG tablet Take 100 mg by mouth daily.  . Multiple Vitamin (MULTIVITAMIN WITH MINERALS) TABS tablet Take 1 tablet by mouth daily.  . vitamin B-12 1000 MCG tablet Take 1 tablet (1,000 mcg total) by mouth daily.  . [DISCONTINUED] guaiFENesin-dextromethorphan (ROBITUSSIN DM) 100-10 MG/5ML syrup Take 10 mLs by mouth every 4 (four) hours as needed for cough.   No facility-administered encounter medications on file as of 01/21/2019.    Follow-up: Return in about 2 months (around 03/21/2019).   Telephone encounter 10 minutes  [name], NP

## 2019-01-21 NOTE — Assessment & Plan Note (Signed)
Will obtain labs at next visit

## 2019-01-21 NOTE — Telephone Encounter (Signed)
Called to check in for appointment (TELE). No answer. Left a message to call back.

## 2019-01-21 NOTE — Assessment & Plan Note (Signed)
We will continue with current regimen Heart failure diet and exercise provided

## 2019-01-28 ENCOUNTER — Other Ambulatory Visit: Payer: Self-pay | Admitting: Nurse Practitioner

## 2019-01-28 ENCOUNTER — Telehealth: Payer: Self-pay | Admitting: Nurse Practitioner

## 2019-01-28 MED ORDER — GUAIFENESIN-DM 100-10 MG/5ML PO SYRP: 10.0000 mL | ORAL_SOLUTION | ORAL | 0 refills | Status: AC | PRN

## 2019-01-28 NOTE — Telephone Encounter (Signed)
Is there something for cough you can give her? Please advise.

## 2019-01-28 NOTE — Telephone Encounter (Signed)
Called and made patient aware. Thanks!  

## 2019-03-23 ENCOUNTER — Ambulatory Visit (INDEPENDENT_AMBULATORY_CARE_PROVIDER_SITE_OTHER): Payer: Medicare HMO | Admitting: Nurse Practitioner

## 2019-03-23 ENCOUNTER — Other Ambulatory Visit: Payer: Self-pay

## 2019-03-23 ENCOUNTER — Encounter: Payer: Self-pay | Admitting: Nurse Practitioner

## 2019-03-23 ENCOUNTER — Ambulatory Visit (HOSPITAL_COMMUNITY)
Admission: RE | Admit: 2019-03-23 | Discharge: 2019-03-23 | Disposition: A | Discharge status: Home / Self Care | Payer: Medicare HMO | Source: Ambulatory Visit | Attending: Nurse Practitioner | Admitting: Nurse Practitioner

## 2019-03-23 VITALS — BP 131/68 | HR 88 | Temp 98.6°F | Resp 16 | Ht 66.0 in | Wt 262.0 lb

## 2019-03-23 DIAGNOSIS — I10 Essential (primary) hypertension: Secondary | ICD-10-CM | POA: Diagnosis not present

## 2019-03-23 DIAGNOSIS — G8929 Other chronic pain: Secondary | ICD-10-CM

## 2019-03-23 DIAGNOSIS — M545 Low back pain: Secondary | ICD-10-CM | POA: Insufficient documentation

## 2019-03-23 DIAGNOSIS — D638 Anemia in other chronic diseases classified elsewhere: Secondary | ICD-10-CM | POA: Diagnosis not present

## 2019-03-23 DIAGNOSIS — M25562 Pain in left knee: Secondary | ICD-10-CM | POA: Insufficient documentation

## 2019-03-23 DIAGNOSIS — Z0000 Encounter for general adult medical examination without abnormal findings: Secondary | ICD-10-CM

## 2019-03-23 LAB — POCT URINALYSIS DIPSTICK
Bilirubin, UA: NEGATIVE
Blood, UA: NEGATIVE
Glucose, UA: NEGATIVE
Ketones, UA: NEGATIVE
Leukocytes, UA: NEGATIVE
Nitrite, UA: NEGATIVE
Protein, UA: NEGATIVE
Spec Grav, UA: 1.025 (ref 1.010–1.025)
Urobilinogen, UA: 0.2 E.U./dL
pH, UA: 5.5 (ref 5.0–8.0)

## 2019-03-23 MED ORDER — AMLODIPINE BESYLATE 5 MG PO TABS: 5.0000 mg | ORAL_TABLET | Freq: Every day | ORAL | 0 refills | Status: DC

## 2019-03-23 MED ORDER — FUROSEMIDE 20 MG PO TABS: 20.0000 mg | ORAL_TABLET | Freq: Every day | ORAL | 0 refills | Status: DC

## 2019-03-23 MED ORDER — MELOXICAM 7.5 MG PO TABS: 7.5000 mg | ORAL_TABLET | Freq: Every day | ORAL | 0 refills | Status: DC

## 2019-03-23 NOTE — Progress Notes (Signed)
Established Patient Office Visit  Subjective:  Patient ID: Deborah Holmes, female    DOB: 09-19-1950  Age: 69 y.o. MRN: [id number]  CC:  Chief Complaint  Patient presents with  . Hypertension  . Back Pain  . Knee Pain    left knee pain   . Medication Refill    amlodipine   . Cough    resudule from covid     HPI Deborah Holmes presents for follow up. She  has a past medical history of CHF (congestive heart failure) ([place]) and Hypertension.   Hypertension Patient is here for follow-up of elevated blood pressure. She is exercising and is adherent to a low-salt diet. Blood pressure is well controlled at home. Cardiac symptoms: none. Patient denies chest pain, dyspnea, fatigue, irregular heart beat, lower extremity edema and palpitations. Cardiovascular risk factors: advanced age (older than 54 for men, 35 for women), obesity (BMI >= 30 kg/m2) and sedentary lifestyle. Use of agents associated with hypertension: NSAIDS. History of target organ damage: none.  Back Pain Patient presents for evaluation of low back problems. Symptoms have been present for several month and include pain in stabbing (aching in character; 10/10 in severity). Initial inciting event: none. Symptoms are worse with activity. Alleviating factors identifiable by the patient are resting by lying down. Aggravating factors identifiable by the patient are standing and walking. Treatments initiated by the patient: NSAIDs and rest. Previous lower back problems: none. Previous work up: none. Previous treatments: NSAIDs and narcotics.  Knee Pain Patient presents with knee pain involving the left knee. Onset of the symptoms was several years ago. Inciting event: this is a longstanding problem which has been getting worse. Current symptoms include pain located left knee, stiffness and swelling. Pain is aggravated by any weight bearing. Patient has prior knee problems. Evaluation to date: plain films: abnormal ostepenia with  degenerative disease. Treatment to date: avoidance of offending activity, corticosteroid injection which was not very effective, prescription NSAIDS which are somewhat effective and rest.  She was diagnosed with COV-19 Jan. 2021. She admits that she is doing well overall but she continues to have a non productive cough. She has been using some OTC cough chews which have been effective but she would like to know if there is anything additional that she can use. Denies headache, dizziness, visual changes, shortness of breath, dyspnea on exertion, chest pain, nausea, vomiting or any edema.    Past Medical History:  Diagnosis Date  . CHF (congestive heart failure) ([place])   . Hypertension     Past Surgical History:  Procedure Laterality Date  . ROTATOR CUFF REPAIR      Family History  Problem Relation Age of Onset  . CAD Mother   . Sickle cell anemia Mother   . CAD Father   . Diabetes type II Father     Social History   Socioeconomic History  . Marital status: Married    Spouse name: Not on file  . Number of children: Not on file  . Years of education: Not on file  . Highest education level: Not on file  Occupational History  . Not on file  Tobacco Use  . Smoking status: Passive Smoke Exposure - Never Smoker  . Smokeless tobacco: Never Used  Substance and Sexual Activity  . Alcohol use: No  . Drug use: No  . Sexual activity: Not on file  Other Topics Concern  . Not on file  Social History Narrative  . Not  on file   Social Determinants of Health   Financial Resource Strain:   . Difficulty of Paying Living Expenses:   Food Insecurity:   . Worried About Charity fundraiser in the Last Year:   . Arboriculturist in the Last Year:   Transportation Needs:   . Film/video editor (Medical):   Marland Kitchen Lack of Transportation (Non-Medical):   Physical Activity:   . Days of Exercise per Week:   . Minutes of Exercise per Session:   Stress:   . Feeling of Stress :   Social  Connections:   . Frequency of Communication with Friends and Family:   . Frequency of Social Gatherings with Friends and Family:   . Attends Religious Services:   . Active Member of Clubs or Organizations:   . Attends Archivist Meetings:   Marland Kitchen Marital Status:   Intimate Partner Violence:   . Fear of Current or Ex-Partner:   . Emotionally Abused:   Marland Kitchen Physically Abused:   . Sexually Abused:     Outpatient Medications Prior to Visit  Medication Sig Dispense Refill  . aspirin EC 81 MG tablet Take 81 mg by mouth daily.    . carvedilol (COREG) 25 MG tablet Take 25 mg by mouth 2 (two) times daily.    . cholecalciferol (VITAMIN D) 25 MCG tablet Take 1 tablet (1,000 Units total) by mouth daily. 30 tablet 1  . losartan (COZAAR) 100 MG tablet Take 100 mg by mouth daily.    Marland Kitchen amLODipine (NORVASC) 5 MG tablet Take 5 mg by mouth daily.    . furosemide (LASIX) 20 MG tablet Take 1 tablet (20 mg total) by mouth daily. 30 tablet 0  . meloxicam (MOBIC) 7.5 MG tablet Take 7.5 mg by mouth daily.    Marland Kitchen HYDROcodone-acetaminophen (NORCO/VICODIN) 5-325 MG tablet Take 1 tablet by mouth every 4 (four) hours as needed. (Patient not taking: Reported on 03/23/2019) 8 tablet 0  . Multiple Vitamin (MULTIVITAMIN WITH MINERALS) TABS tablet Take 1 tablet by mouth daily. (Patient not taking: Reported on 03/23/2019) 30 tablet 0  . vitamin B-12 1000 MCG tablet Take 1 tablet (1,000 mcg total) by mouth daily. (Patient not taking: Reported on 03/23/2019) 30 tablet 0   No facility-administered medications prior to visit.    No Known Allergies  ROS Review of Systems  Constitutional: Negative.   HENT: Negative.   Eyes: Negative.   Respiratory: Positive for cough.   Cardiovascular: Negative.   Gastrointestinal: Negative.   Endocrine: Negative.   Genitourinary: Negative.   Skin: Negative.   Allergic/Immunologic: Negative.   Hematological: Negative.   Psychiatric/Behavioral: Negative.       Objective:      Physical Exam  Constitutional: She is oriented to person, place, and time. She appears well-developed.  HENT:  Head: Normocephalic.  Cardiovascular: Normal rate, regular rhythm, normal heart sounds and intact distal pulses.  Pulmonary/Chest: Effort normal and breath sounds normal.  Abdominal: Soft. Bowel sounds are normal.  Musculoskeletal:        General: Deformity present.     Cervical back: Normal range of motion.     Lumbar back: Tenderness present. Decreased range of motion.       Back:     Left knee: Deformity present. Abnormal alignment.       Legs:  Neurological: She is alert and oriented to person, place, and time.  Skin: Skin is warm and dry.  Psychiatric: She has a normal mood and affect.  Her behavior is normal. Judgment and thought content normal.    BP 131/68 (BP Location: Right Arm, Patient Position: Sitting, Cuff Size: Large)   Pulse 88   Temp 98.6 F (37 C) (Oral)   Resp 16   Ht 5\' 6"  (1.676 m)   Wt 262 lb (118.8 kg)   SpO2 100%   BMI 42.29 kg/m  Wt Readings from Last 3 Encounters:  03/23/19 262 lb (118.8 kg)  01/08/19 251 lb (113.9 kg)  02/23/16 240 lb (108.9 kg)     Health Maintenance Due  Topic Date Due  . Hepatitis C Screening  Never done  . TETANUS/TDAP  Never done  . MAMMOGRAM  Never done  . COLONOSCOPY  Never done  . DEXA SCAN  Never done    There are no preventive care reminders to display for this patient.  Lab Results  Component Value Date   TSH 1.080 03/23/2019   Lab Results  Component Value Date   WBC 5.3 03/23/2019   HGB 10.8 (L) 03/23/2019   HCT 33.4 (L) 03/23/2019   MCV 88 03/23/2019   PLT 324 03/23/2019   Lab Results  Component Value Date   NA 144 03/23/2019   K 4.2 03/23/2019   CO2 25 01/12/2019   GLUCOSE 84 03/23/2019   BUN 13 03/23/2019   CREATININE 0.86 03/23/2019   BILITOT 0.4 03/23/2019   ALKPHOS 97 03/23/2019   AST 14 03/23/2019   ALT 48 (H) 01/12/2019   PROT 6.8 03/23/2019   ALBUMIN 4.2 03/23/2019    CALCIUM 9.6 03/23/2019   ANIONGAP 8 01/12/2019   Lab Results  Component Value Date   CHOL 187 03/23/2019   Lab Results  Component Value Date   HDL 57 03/23/2019   Lab Results  Component Value Date   LDLCALC 104 (H) 03/23/2019   Lab Results  Component Value Date   TRIG 149 03/23/2019   Lab Results  Component Value Date   CHOLHDL 3.3 03/23/2019   No results found for: HGBA1C    Assessment & Plan:   Problem List Items Addressed This Visit      High   Anemia   Relevant Orders   CBC with Differential/Platelet (Completed)   Vitamin B12 (Completed)   Iron, TIBC and Ferritin Panel (Completed)   Hypertension - Primary   Relevant Medications   amLODipine (NORVASC) 5 MG tablet   furosemide (LASIX) 20 MG tablet   Other Relevant Orders   Urinalysis Dipstick (Completed)   Comp. Metabolic Panel (12) (Completed)    Other Visit Diagnoses    Chronic pain of left knee       Relevant Medications   meloxicam (MOBIC) 7.5 MG tablet   Other Relevant Orders   DG Knee Complete 4 Views Left (Completed)   Sedimentation rate (Completed)   Chronic midline low back pain without sciatica       Relevant Medications   meloxicam (MOBIC) 7.5 MG tablet   Other Relevant Orders   DG Lumbar Spine Complete W/Bend (Completed)   Healthcare maintenance       Relevant Orders   Lipid panel (Completed)   TSH (Completed)   Magnesium (Completed)      Meds ordered this encounter  Medications  . meloxicam (MOBIC) 7.5 MG tablet    Sig: Take 1 tablet (7.5 mg total) by mouth daily.    Dispense:  90 tablet    Refill:  0    Order Specific Question:   Supervising Provider    Answer:  JEGEDE, OLUGBEMIGA E [id number]  . amLODipine (NORVASC) 5 MG tablet    Sig: Take 1 tablet (5 mg total) by mouth daily.    Dispense:  90 tablet    Refill:  0    Order Specific Question:   Supervising Provider    Answer:   Tresa Garter [id number]  . furosemide (LASIX) 20 MG tablet    Sig: Take 1 tablet (20 mg  total) by mouth daily.    Dispense:  90 tablet    Refill:  0    Order Specific Question:   Supervising Provider    Answer:   Tresa Garter [id number]    Follow-up: Return in about 3 months (around 06/23/2019).    [name], NP

## 2019-03-24 ENCOUNTER — Telehealth: Payer: Self-pay

## 2019-03-24 ENCOUNTER — Encounter: Payer: Self-pay | Admitting: Nurse Practitioner

## 2019-03-24 ENCOUNTER — Other Ambulatory Visit: Payer: Self-pay | Admitting: Nurse Practitioner

## 2019-03-24 DIAGNOSIS — M47816 Spondylosis without myelopathy or radiculopathy, lumbar region: Secondary | ICD-10-CM | POA: Insufficient documentation

## 2019-03-24 DIAGNOSIS — M179 Osteoarthritis of knee, unspecified: Secondary | ICD-10-CM | POA: Insufficient documentation

## 2019-03-24 DIAGNOSIS — M47817 Spondylosis without myelopathy or radiculopathy, lumbosacral region: Secondary | ICD-10-CM | POA: Insufficient documentation

## 2019-03-24 DIAGNOSIS — M1712 Unilateral primary osteoarthritis, left knee: Secondary | ICD-10-CM

## 2019-03-24 DIAGNOSIS — M1710 Unilateral primary osteoarthritis, unspecified knee: Secondary | ICD-10-CM | POA: Insufficient documentation

## 2019-03-24 LAB — CBC WITH DIFFERENTIAL/PLATELET
Basophils Absolute: 0 10*3/uL (ref 0.0–0.2)
Basos: 1 %
EOS (ABSOLUTE): 0.1 10*3/uL (ref 0.0–0.4)
Eos: 2 %
Hematocrit: 33.4 % — ABNORMAL LOW (ref 34.0–46.6)
Hemoglobin: 10.8 g/dL — ABNORMAL LOW (ref 11.1–15.9)
Immature Grans (Abs): 0 10*3/uL (ref 0.0–0.1)
Immature Granulocytes: 0 %
Lymphocytes Absolute: 1.2 10*3/uL (ref 0.7–3.1)
Lymphs: 23 %
MCH: 28.3 pg (ref 26.6–33.0)
MCHC: 32.3 g/dL (ref 31.5–35.7)
MCV: 88 fL (ref 79–97)
Monocytes Absolute: 0.4 10*3/uL (ref 0.1–0.9)
Monocytes: 8 %
Neutrophils Absolute: 3.5 10*3/uL (ref 1.4–7.0)
Neutrophils: 66 %
Platelets: 324 10*3/uL (ref 150–450)
RBC: 3.81 x10E6/uL (ref 3.77–5.28)
RDW: 13.9 % (ref 11.7–15.4)
WBC: 5.3 10*3/uL (ref 3.4–10.8)

## 2019-03-24 LAB — IRON,TIBC AND FERRITIN PANEL
Ferritin: 305 ng/mL — ABNORMAL HIGH (ref 15–150)
Iron Saturation: 19 % (ref 15–55)
Iron: 55 ug/dL (ref 27–139)
Total Iron Binding Capacity: 292 ug/dL (ref 250–450)
UIBC: 237 ug/dL (ref 118–369)

## 2019-03-24 LAB — SEDIMENTATION RATE: Sed Rate: 54 mm/hr — ABNORMAL HIGH (ref 0–40)

## 2019-03-24 LAB — COMP. METABOLIC PANEL (12)
AST: 14 IU/L (ref 0–40)
Albumin/Globulin Ratio: 1.6 (ref 1.2–2.2)
Albumin: 4.2 g/dL (ref 3.8–4.8)
Alkaline Phosphatase: 97 IU/L (ref 39–117)
BUN/Creatinine Ratio: 15 (ref 12–28)
BUN: 13 mg/dL (ref 8–27)
Bilirubin Total: 0.4 mg/dL (ref 0.0–1.2)
Calcium: 9.6 mg/dL (ref 8.7–10.3)
Chloride: 108 mmol/L — ABNORMAL HIGH (ref 96–106)
Creatinine, Ser: 0.86 mg/dL (ref 0.57–1.00)
GFR calc Af Amer: 80 mL/min/{1.73_m2} (ref >59)
GFR calc non Af Amer: 70 mL/min/{1.73_m2} (ref >59)
Globulin, Total: 2.6 g/dL (ref 1.5–4.5)
Glucose: 84 mg/dL (ref 65–99)
Potassium: 4.2 mmol/L (ref 3.5–5.2)
Sodium: 144 mmol/L (ref 134–144)
Total Protein: 6.8 g/dL (ref 6.0–8.5)

## 2019-03-24 LAB — LIPID PANEL
Chol/HDL Ratio: 3.3 ratio (ref 0.0–4.4)
Cholesterol, Total: 187 mg/dL (ref 100–199)
HDL: 57 mg/dL (ref >39)
LDL Chol Calc (NIH): 104 mg/dL — ABNORMAL HIGH (ref 0–99)
Triglycerides: 149 mg/dL (ref 0–149)
VLDL Cholesterol Cal: 26 mg/dL (ref 5–40)

## 2019-03-24 LAB — MAGNESIUM: Magnesium: 1.9 mg/dL (ref 1.6–2.3)

## 2019-03-24 LAB — VITAMIN B12: Vitamin B-12: 422 pg/mL (ref 232–1245)

## 2019-03-24 LAB — TSH: TSH: 1.08 u[IU]/mL (ref 0.450–4.500)

## 2019-03-24 NOTE — Telephone Encounter (Signed)
-----   Message from [name], NP sent at 03/24/2019 11:10 AM EDT ----- Please make Deborah Holmes aware that her knee xray is abnormal and we will refer her to ortho. She does have some inflammation in her lower back also. The Meloxicam should be effective with treating this. Sed rate elevated does indicate inflammation. She continues to have anemia. We will continue to monitor this. Colonoscopy?All other labs stable.

## 2019-03-24 NOTE — Telephone Encounter (Signed)
Called and spoke with patient that knee xray was abnormal and that we are referring her to ortho for this to listen for a call from their office. Advised back showed inflammation and that meloxicam should be effective with treating this. Advised that labs show anemia and inflammation, we will continue to monitor this. She does want a colonoscopy and advised we will put in referral for this. Thanks!

## 2019-03-25 ENCOUNTER — Encounter: Payer: Self-pay | Admitting: Nurse Practitioner

## 2019-03-25 ENCOUNTER — Other Ambulatory Visit: Payer: Self-pay | Admitting: Nurse Practitioner

## 2019-03-25 DIAGNOSIS — Z0000 Encounter for general adult medical examination without abnormal findings: Secondary | ICD-10-CM

## 2019-03-25 DIAGNOSIS — D649 Anemia, unspecified: Secondary | ICD-10-CM

## 2019-04-05 NOTE — Progress Notes (Signed)
04/05/2019 Deborah Holmes [facility] [id number] 07-24-1950   CHIEF COMPLAINT:   She does not know why she is here, stated " my new doctor scheduled this appointment".   HISTORY OF PRESENT ILLNESS:  Deborah Holmes is a 69 year old female with a past medical history of hypertensin, diastolic and systolic CHF.  ECHO 02/16/2017 showed  EF 35- 40%.  She was admitted to [facility] hospital 01/12/2019 with COVID 19 pneumonia for which she received Rocephin/Azithromycin, Remdesivir and steroids. She was on oxygen 2L [place] at home for a few weeks then was discontinued. She continues to have a mild nonproductive cough which has lingered since being diagnosed with Covid 19.   She was seen by her PCP Dr. Dionisio Holmes on 03/23/2019 for routine follow up regarding HTN, lower back and knee pain.  She has a history of chronic normocytic anemia and she was referred to our office for further evaluation. She's never undergone a screening colonoscopy. She denies having any dysphagia, heartburn or upper abdominal pain. No lower abdominal pain. She is passing a normal formed brown bowel movement once every other day. No rectal bleeding or black stools. No dysuria. No weight loss. No fever, sweats or chills. No family history of esophageal, gastric or colon cancer. Mother with sickle cell anemia. She takes ASA 81mg  daily and Mobic 7.5mg  once daily (for arthritis pain).   She has a history of CHF, last seen by cardiologist Dr. Nolon Holmes  At [facility] 08/01/2017. Her most recent ECHO was 02/08/2017 which showed LV EF 123456 with diastolic dysfunction unchanged from prior studies. Mildly elevated pulmonary artery systolic pressures with normal right ventricular systolic function. She denies having any chest pain, palpitations or SOB.     CBC Latest Ref Rng & Units 03/23/2019 01/12/2019 01/11/2019  WBC 3.4 - 10.8 x10E3/uL 5.3 12.9(H) 11.3(H)  Hemoglobin 11.1 - 15.9 g/dL 10.8(L) 10.3(L) 10.6(L)  Hematocrit 34.0 - 46.6 % 33.4(L)  32.4(L) 32.7(L)  Platelets 150 - 450 x10E3/uL 324 423(H) 382  MCV 88. RDW 13.9.  Iron/TIBC/Ferritin/ %Sat    Component Value Date/Time   IRON 55 03/23/2019 1055   TIBC 292 03/23/2019 1055   FERRITIN 305 (H) 03/23/2019 1055   IRONPCTSAT 19 03/23/2019 1055   Vitamin B12  422.  CMP Latest Ref Rng & Units 03/23/2019 01/12/2019 01/11/2019  Glucose 65 - 99 mg/dL 84 173(H) 160(H)  BUN 8 - 27 mg/dL 13 23 26(H)  Creatinine 0.57 - 1.00 mg/dL 0.86 0.82 0.77  Sodium 134 - 144 mmol/L 144 138 138  Potassium 3.5 - 5.2 mmol/L 4.2 4.6 4.3  Chloride 96 - 106 mmol/L 108(H) 105 108  CO2 22 - 32 mmol/L - 25 24  Calcium 8.7 - 10.3 mg/dL 9.6 8.8(L) 8.7(L)  Total Protein 6.0 - 8.5 g/dL 6.8 6.4(L) 6.9  Total Bilirubin 0.0 - 1.2 mg/dL 0.4 0.6 0.8  Alkaline Phos 39 - 117 IU/L 97 57 57  AST 0 - 40 IU/L 14 19 34  ALT 0 - 44 U/L - 48(H) 61(H)  Sed Rate 54.    Past Medical History:  Diagnosis Date  . CHF (congestive heart failure) ([place])   . Hypertension    Past Surgical History:  Procedure Laterality Date  . ROTATOR CUFF REPAIR      reports that she is a non-smoker but has been exposed to tobacco smoke. She has never used smokeless tobacco. She reports that she does not drink alcohol or use drugs. family history includes CAD  in her father and mother; Diabetes type II in her father; Sickle cell anemia in her mother. No Known Allergies    Outpatient Encounter Medications as of 04/06/2019  Medication Sig  . amLODipine (NORVASC) 5 MG tablet Take 1 tablet (5 mg total) by mouth daily.  Marland Kitchen aspirin EC 81 MG tablet Take 81 mg by mouth daily.  . carvedilol (COREG) 25 MG tablet Take 25 mg by mouth 2 (two) times daily.  . cholecalciferol (VITAMIN D) 25 MCG tablet Take 1 tablet (1,000 Units total) by mouth daily.  . furosemide (LASIX) 20 MG tablet Take 1 tablet (20 mg total) by mouth daily.  Marland Kitchen HYDROcodone-acetaminophen (NORCO/VICODIN) 5-325 MG tablet Take 1 tablet by mouth every 4 (four) hours as needed. (Patient  not taking: Reported on 03/23/2019)  . losartan (COZAAR) 100 MG tablet Take 100 mg by mouth daily.  . meloxicam (MOBIC) 7.5 MG tablet Take 1 tablet (7.5 mg total) by mouth daily.  . Multiple Vitamin (MULTIVITAMIN WITH MINERALS) TABS tablet Take 1 tablet by mouth daily. (Patient not taking: Reported on 03/23/2019)  . vitamin B-12 1000 MCG tablet Take 1 tablet (1,000 mcg total) by mouth daily. (Patient not taking: Reported on 03/23/2019)   No facility-administered encounter medications on file as of 04/06/2019.     REVIEW OF SYSTEMS: All other systems reviewed and negative except where noted in the History of Present Illness.   PHYSICAL EXAM: BP 136/72   Pulse 84   Temp 98.2 F (36.8 C)   Ht 5\' 6"  (1.676 m)   Wt 263 lb 12.8 oz (119.7 kg)   BMI 42.58 kg/m  General:  69 year old obese female no acute distress. Head: Normocephalic and atraumatic. Eyes:  Sclerae non-icteric, conjunctive pink. Ears: Normal auditory acuity. Mouth: Poor dentition, few chipped teeth, many absent teeth. No ulcers or lesions.  Neck: Supple, no lymphadenopathy or thyromegaly.  Lungs: Clear bilaterally to auscultation without wheezes, crackles or rhonchi. Heart: Regular rate and rhythm. No murmur, rub or gallop appreciated.  Abdomen: Soft, nontender, non distended. No masses. No hepatosplenomegaly. Normoactive bowel sounds x 4 quadrants.  Rectal: Deferred.  Musculoskeletal: Symmetrical with no gross deformities. Skin: Warm and dry. No rash or lesions on visible extremities. Extremities: No edema. Neurological: Alert oriented x 4, no focal deficits.  Psychological:  Alert and cooperative. Normal mood and affect.  ASSESSMENT AND PLAN:  9. 69 year old female with chronic normocytic anemia. Normal iron level of  55. TIBC 292. Ferritin 305.  No signs of active GI bleeding. Hg 10.8. (Hg 10.6 on 10/14/2014 and Hg 11.1 on 01/06/2015). HCT 33.4. MCV 88.  Vitamin B12 422.  -Colonoscopy benefits and risks discussed  including risk with sedation, risk of bleeding, perforation and infection -Recommend hematology consult   2. Covid 19 pneumonia 01/12/2019    3. CHF. EF 35- 40% per ECHO 02/2017  Further follow up to be determined after the above evaluation completed.      CC:  [name], NP

## 2019-04-06 ENCOUNTER — Ambulatory Visit: Payer: Medicare HMO | Admitting: Nurse Practitioner

## 2019-04-06 ENCOUNTER — Encounter: Payer: Self-pay | Admitting: Nurse Practitioner

## 2019-04-06 VITALS — BP 136/72 | HR 84 | Temp 98.2°F | Ht 66.0 in | Wt 263.8 lb

## 2019-04-06 DIAGNOSIS — Z1211 Encounter for screening for malignant neoplasm of colon: Secondary | ICD-10-CM

## 2019-04-06 DIAGNOSIS — D649 Anemia, unspecified: Secondary | ICD-10-CM | POA: Diagnosis not present

## 2019-04-06 MED ORDER — NA SULFATE-K SULFATE-MG SULF 17.5-3.13-1.6 GM/177ML PO SOLN: 1.0000 | Freq: Once | ORAL | 0 refills | Status: AC

## 2019-04-06 NOTE — Progress Notes (Signed)
Agree with hematology evaluation in this medically complicated patient

## 2019-04-06 NOTE — Patient Instructions (Signed)
If you are age 69 or older, your body mass index should be between 23-30. Your Body mass index is 42.58 kg/m. If this is out of the aforementioned range listed, please consider follow up with your Primary Care Provider.  If you are age 102 or younger, your body mass index should be between 19-25. Your Body mass index is 42.58 kg/m. If this is out of the aformentioned range listed, please consider follow up with your Primary Care Provider.   We have sent the following medications to your pharmacy for you to pick up at your convenience:  suprep  Due to recent changes in healthcare laws, you may see the results of your imaging and laboratory studies on MyChart before your provider has had a chance to review them.  We understand that in some cases there may be results that are confusing or concerning to you. Not all laboratory results come back in the same time frame and the provider may be waiting for multiple results in order to interpret others.  Please give [place] 48 hours in order for your provider to thoroughly review all the results before contacting the office for clarification of your results.

## 2019-04-07 ENCOUNTER — Telehealth: Payer: Self-pay | Admitting: Nurse Practitioner

## 2019-04-07 ENCOUNTER — Encounter: Payer: Self-pay | Admitting: Orthopaedic Surgery

## 2019-04-07 ENCOUNTER — Ambulatory Visit (INDEPENDENT_AMBULATORY_CARE_PROVIDER_SITE_OTHER): Payer: Medicare HMO | Admitting: Orthopaedic Surgery

## 2019-04-07 ENCOUNTER — Other Ambulatory Visit: Payer: Self-pay

## 2019-04-07 VITALS — Ht 66.0 in | Wt 263.0 lb

## 2019-04-07 DIAGNOSIS — Z6841 Body Mass Index (BMI) 40.0 and over, adult: Secondary | ICD-10-CM

## 2019-04-07 DIAGNOSIS — M1712 Unilateral primary osteoarthritis, left knee: Secondary | ICD-10-CM

## 2019-04-07 DIAGNOSIS — D649 Anemia, unspecified: Secondary | ICD-10-CM

## 2019-04-07 NOTE — Telephone Encounter (Signed)
The pt has been advised that a referral to hematology has been entered for chronic anemia.

## 2019-04-07 NOTE — Progress Notes (Signed)
Office Visit Note   Patient: Deborah Holmes           Date of Birth: 1950/10/22           MRN: [id number] Visit Date: 04/07/2019              Requested by: [name], NP [address] [place],  [place] [address] PCP: Patient, No Pcp Per   Assessment & Plan: Visit Diagnoses:  1. Primary osteoarthritis of left knee   2. Body mass index 40.0-44.9, adult ([place])   3. Morbid obesity ([place])     Plan: My impression is the left knee degenerative joint disease.  Again we talked about the available treatment options and given the lack of improvement from Voltaren gel and cortisone injections previously she is not interested in these treatments.  She will continue to take meloxicam for the pain but I did caution her on potential cardiac side effects especially with her cardiac history.  She understands that if she wishes to proceed with a knee replacement that she will have to lose weight in order to achieve a BMI of less than 40.  I have given her my card so that she can reach out to [place] if she wants to go in that direction.  Otherwise we will see her back as needed. The patient meets the AMA guidelines for Morbid (severe) obesity with a BMI > 40.0 and I have recommended weight loss.  Follow-Up Instructions: Return if symptoms worsen or fail to improve.   Orders:  No orders of the defined types were placed in this encounter.  No orders of the defined types were placed in this encounter.     Procedures: No procedures performed   Clinical Data: No additional findings.   Subjective: Chief Complaint  Patient presents with  . Left Knee - Pain      Deborah Holmes is a 69 year old female here for evaluation of chronic left knee pain for years.  Pain is worse on the lateral side that is 10 out of 10 when walking.  She denies any grinding catching or swelling.  Has occasional giving way on her.  She will occasionally use a cane for ambulation.  She currently takes meloxicam on a daily basis.   She has tried cortisone injection as well as Voltaren gel in the past without significant pain relief.  The knee pain is moderately limiting her activity and quality of life.   Review of Systems  Constitutional: Negative.   HENT: Negative.   Eyes: Negative.   Respiratory: Negative.   Cardiovascular: Negative.   Endocrine: Negative.   Musculoskeletal: Negative.   Neurological: Negative.   Hematological: Negative.   Psychiatric/Behavioral: Negative.   All other systems reviewed and are negative.    Objective: Vital Signs: Ht 5\' 6"  (1.676 m)   Wt 263 lb (119.3 kg)   BMI 42.45 kg/m   Physical Exam Vitals and nursing note reviewed.  Constitutional:      Appearance: She is well-developed.  HENT:     Head: Normocephalic and atraumatic.  Pulmonary:     Effort: Pulmonary effort is normal.  Abdominal:     Palpations: Abdomen is soft.  Musculoskeletal:     Cervical back: Neck supple.  Skin:    General: Skin is warm.     Capillary Refill: Capillary refill takes less than 2 seconds.  Neurological:     Mental Status: She is alert and oriented to person, place, and time.  Psychiatric:  Behavior: Behavior normal.        Thought Content: Thought content normal.        Judgment: Judgment normal.     Ortho Exam Left knee shows no joint effusion.  Moderate limitation range of motion.  Slight valgus alignment.  Collaterals and cruciates are stable.  Mild lateral joint tenderness. Specialty Comments:  No specialty comments available.  Imaging: No results found.   PMFS History: Patient Active Problem List   Diagnosis Date Noted  . Primary osteoarthritis of left knee 04/07/2019  . Body mass index 40.0-44.9, adult ([place]) 04/07/2019  . Morbid obesity ([place]) 04/07/2019  . DJD (degenerative joint disease) of knee 03/24/2019  . Lumbar spondylosis 03/24/2019  . Facet hypertrophy of lumbosacral region 03/24/2019  . Chronic combined systolic (congestive) and diastolic  (congestive) heart failure ([place]) 01/07/2019  . Obesity, Class III, BMI 40-49.9 (morbid obesity) ([place]) 01/07/2019  . Suspected COVID-19 virus infection 01/07/2019  . Acute respiratory failure due to COVID-19 ([place]) 01/07/2019  . Anemia 12/03/2014  . CHF exacerbation ([place]) 12/03/2014  . Hypertension 12/03/2014  . Acute on chronic combined systolic and diastolic heart failure ([place])   . Rotator cuff syndrome of right shoulder 04/26/2012  . S/P TAH-BSO 04/26/2012  . Obesity 04/26/2012  . Chronic systolic heart failure ([place]) 10/17/2010  . Hypertensive heart disease 04/16/2000   Past Medical History:  Diagnosis Date  . CHF (congestive heart failure) ([place])   . Hypertension     Family History  Problem Relation Age of Onset  . CAD Mother   . Sickle cell anemia Mother   . CAD Father   . Diabetes type II Father     Past Surgical History:  Procedure Laterality Date  . ROTATOR CUFF REPAIR     Social History   Occupational History  . Not on file  Tobacco Use  . Smoking status: Passive Smoke Exposure - Never Smoker  . Smokeless tobacco: Never Used  Substance and Sexual Activity  . Alcohol use: No  . Drug use: No  . Sexual activity: Not on file

## 2019-04-07 NOTE — Telephone Encounter (Signed)
Please enter hematology referral for patient. DX: normocytic anemia, chronic. Pls call patient and let her know referral was entered. I discussed this with the patient at the time of her last office visit. Please re-explain to patient that this referral is due to her having anemia as she has some difficulty understanding her medical appointments. Refer to her office visit 4/5. THX.

## 2019-05-07 ENCOUNTER — Encounter: Payer: Self-pay | Admitting: Internal Medicine

## 2019-05-13 ENCOUNTER — Encounter: Payer: Self-pay | Admitting: Internal Medicine

## 2019-05-13 ENCOUNTER — Ambulatory Visit (AMBULATORY_SURGERY_CENTER): Payer: Medicare HMO | Admitting: Internal Medicine

## 2019-05-13 ENCOUNTER — Other Ambulatory Visit: Payer: Self-pay

## 2019-05-13 VITALS — BP 141/96 | HR 76 | Temp 97.3°F | Resp 17 | Ht 66.0 in | Wt 263.0 lb

## 2019-05-13 DIAGNOSIS — Z1211 Encounter for screening for malignant neoplasm of colon: Secondary | ICD-10-CM

## 2019-05-13 DIAGNOSIS — D649 Anemia, unspecified: Secondary | ICD-10-CM

## 2019-05-13 DIAGNOSIS — D123 Benign neoplasm of transverse colon: Secondary | ICD-10-CM

## 2019-05-13 DIAGNOSIS — D124 Benign neoplasm of descending colon: Secondary | ICD-10-CM

## 2019-05-13 DIAGNOSIS — D508 Other iron deficiency anemias: Secondary | ICD-10-CM

## 2019-05-13 MED ORDER — SODIUM CHLORIDE 0.9 % IV SOLN: 500.0000 mL | Freq: Once | INTRAVENOUS | Status: DC

## 2019-05-13 NOTE — Progress Notes (Signed)
Report to PACU, RN, vss, BBS= Clear.  

## 2019-05-13 NOTE — Progress Notes (Signed)
Called to room to assist during endoscopic procedure.  Patient ID and intended procedure confirmed with present staff. Received instructions for my participation in the procedure from the performing physician.  

## 2019-05-13 NOTE — Patient Instructions (Signed)
Please read handouts provided. Continue present medications. Await pathology results.   YOU HAD AN ENDOSCOPIC PROCEDURE TODAY AT THE [facility]:   Refer to the procedure report that was given to you for any specific questions about what was found during the examination.  If the procedure report does not answer your questions, please call your gastroenterologist to clarify.  If you requested that your care partner not be given the details of your procedure findings, then the procedure report has been included in a sealed envelope for you to review at your convenience later.  YOU SHOULD EXPECT: Some feelings of bloating in the abdomen. Passage of more gas than usual.  Walking can help get rid of the air that was put into your GI tract during the procedure and reduce the bloating. If you had a lower endoscopy (such as a colonoscopy or flexible sigmoidoscopy) you may notice spotting of blood in your stool or on the toilet paper. If you underwent a bowel prep for your procedure, you may not have a normal bowel movement for a few days.  Please Note:  You might notice some irritation and congestion in your nose or some drainage.  This is from the oxygen used during your procedure.  There is no need for concern and it should clear up in a day or so.  SYMPTOMS TO REPORT IMMEDIATELY:  Following lower endoscopy (colonoscopy or flexible sigmoidoscopy):  Excessive amounts of blood in the stool  Significant tenderness or worsening of abdominal pains  Swelling of the abdomen that is new, acute  Fever of 100F or higher   For urgent or emergent issues, a gastroenterologist can be reached at any hour by calling [phone]. Do not use MyChart messaging for urgent concerns.    DIET:  We do recommend a small meal at first, but then you may proceed to your regular diet.  Drink plenty of fluids but you should avoid alcoholic beverages for 24 hours.  ACTIVITY:  You should plan to take it easy  for the rest of today and you should NOT DRIVE or use heavy machinery until tomorrow (because of the sedation medicines used during the test).    FOLLOW UP: Our staff will call the number listed on your records 48-72 hours following your procedure to check on you and address any questions or concerns that you may have regarding the information given to you following your procedure. If we do not reach you, we will leave a message.  We will attempt to reach you two times.  During this call, we will ask if you have developed any symptoms of COVID 19. If you develop any symptoms (ie: fever, flu-like symptoms, shortness of breath, cough etc.) before then, please call [phone].  If you test positive for Covid 19 in the 2 weeks post procedure, please call and report this information to us.    If any biopsies were taken you will be contacted by phone or by letter within the next 1-3 weeks.  Please call us at [phone] if you have not heard about the biopsies in 3 weeks.    SIGNATURES/CONFIDENTIALITY: You and/or your care partner have signed paperwork which will be entered into your electronic medical record.  These signatures attest to the fact that that the information above on your After Visit Summary has been reviewed and is understood.  Full responsibility of the confidentiality of this discharge information lies with you and/or your care-partner.  

## 2019-05-13 NOTE — Op Note (Addendum)
[facility] Patient Name: Travon Merrill Procedure Date: 05/13/2019 11:50 AM MRN: [id number] Endoscopist: Docia Chuck. Henrene Pastor , MD Age: 69 Referring MD:  Date of Birth: 1950-06-08 Gender: Female Account #: [id number] Procedure:                Colonoscopy with cold snare polypectomy x 2 Indications:              Screening for colorectal malignant neoplasm. Sent                            to the office for normocytic anemia without iron                            deficiency Medicines:                Monitored Anesthesia Care Procedure:                Pre-Anesthesia Assessment:                           - Prior to the procedure, a History and Physical                            was performed, and patient medications and                            allergies were reviewed. The patient's tolerance of                            previous anesthesia was also reviewed. The risks                            and benefits of the procedure and the sedation                            options and risks were discussed with the patient.                            All questions were answered, and informed consent                            was obtained. Prior Anticoagulants: The patient has                            taken no previous anticoagulant or antiplatelet                            agents. ASA Grade Assessment: III - A patient with                            severe systemic disease. After reviewing the risks                            and benefits, the patient was deemed in  satisfactory condition to undergo the procedure.                           After obtaining informed consent, the colonoscope                            was passed under direct vision. Throughout the                            procedure, the patient's blood pressure, pulse, and                            oxygen saturations were monitored continuously. The                            Colonoscope  was introduced through the anus and                            advanced to the the cecum, identified by                            appendiceal orifice and ileocecal valve. The                            ileocecal valve, appendiceal orifice, and rectum                            were photographed. The quality of the bowel                            preparation was excellent. The colonoscopy was                            performed without difficulty. The patient tolerated                            the procedure well. The bowel preparation used was                            SUPREP via split dose instruction. Scope In: 11:59:14 AM Scope Out: 12:12:13 PM Scope Withdrawal Time: 0 hours 7 minutes 32 seconds  Total Procedure Duration: 0 hours 12 minutes 59 seconds  Findings:                 Two polyps were found in the descending colon and                            transverse colon. The polyps were 2 mm in size.                            These polyps were removed with a cold snare.                            Resection and retrieval were complete.  The exam was otherwise without abnormality on                            direct and retroflexion views. Complications:            No immediate complications. Estimated blood loss:                            None. Estimated Blood Loss:     Estimated blood loss: none. Impression:               - Two 2 mm polyps in the descending colon and in                            the transverse colon, removed with a cold snare.                            Resected and retrieved.                           - The examination was otherwise normal on direct                            and retroflexion views. Recommendation:           - Repeat colonoscopy is not recommended for                            surveillance (findings, age, comorbidities).                           - Patient has a contact number available for                             emergencies. The signs and symptoms of potential                            delayed complications were discussed with the                            patient. Return to normal activities tomorrow.                            Written discharge instructions were provided to the                            patient.                           - Resume previous diet.                           - Continue present medications.                           - Await pathology results. Docia Chuck. Henrene Pastor, MD 05/13/2019 12:21:21 PM This report has been signed electronically.

## 2019-05-15 ENCOUNTER — Encounter: Payer: Self-pay | Admitting: Internal Medicine

## 2019-05-15 ENCOUNTER — Telehealth: Payer: Self-pay | Admitting: *Deleted

## 2019-05-15 NOTE — Telephone Encounter (Signed)
  Follow up Call-  Call back number 05/13/2019  Post procedure Call Back phone  # [phone]  Permission to leave phone message Yes  Some recent data might be hidden     Patient questions:  Do you have a fever, pain , or abdominal swelling? No. Pain Score  0 *  Have you tolerated food without any problems? Yes.    Have you been able to return to your normal activities? Yes.    Do you have any questions about your discharge instructions: Diet   No. Medications  No. Follow up visit  No.  Do you have questions or concerns about your Care? No.  Actions: * If pain score is 4 or above: No action needed, pain <4.  1. Have you developed a fever since your procedure? no  2.   Have you had an respiratory symptoms (SOB or cough) since your procedure? no  3.   Have you tested positive for COVID 19 since your procedure no  4.   Have you had any family members/close contacts diagnosed with the COVID 19 since your procedure?  no   If yes to any of these questions please route to Joylene John, RN and Erenest Rasher, RN

## 2019-05-18 ENCOUNTER — Telehealth: Payer: Self-pay | Admitting: Nurse Practitioner

## 2019-05-18 ENCOUNTER — Other Ambulatory Visit: Payer: Self-pay | Admitting: Nurse Practitioner

## 2019-05-18 MED ORDER — FUROSEMIDE 20 MG PO TABS: 20.0000 mg | ORAL_TABLET | Freq: Every day | ORAL | 0 refills | Status: DC

## 2019-05-18 MED ORDER — MELOXICAM 7.5 MG PO TABS: 7.5000 mg | ORAL_TABLET | Freq: Every day | ORAL | 0 refills | Status: AC

## 2019-05-18 MED ORDER — AMLODIPINE BESYLATE 5 MG PO TABS: 5.0000 mg | ORAL_TABLET | Freq: Every day | ORAL | 0 refills | Status: DC

## 2019-05-18 NOTE — Telephone Encounter (Signed)
Sent!

## 2019-09-03 ENCOUNTER — Other Ambulatory Visit: Payer: Self-pay | Admitting: Nurse Practitioner

## 2019-09-03 ENCOUNTER — Telehealth: Payer: Self-pay | Admitting: Nurse Practitioner

## 2019-09-03 MED ORDER — CARVEDILOL 25 MG PO TABS: 25.0000 mg | ORAL_TABLET | Freq: Two times a day (BID) | ORAL | 11 refills | Status: DC

## 2019-09-03 MED ORDER — ASPIRIN EC 81 MG PO TBEC: 81.0000 mg | DELAYED_RELEASE_TABLET | Freq: Every day | ORAL | 3 refills | Status: DC

## 2019-09-03 NOTE — Progress Notes (Unsigned)
   [facility] [address] [place], [place]  [address] Phone:  [phone]   Fax:  [phone] 

## 2019-09-03 NOTE — Telephone Encounter (Signed)
Sent!

## 2019-10-07 ENCOUNTER — Other Ambulatory Visit: Payer: Self-pay | Admitting: Nurse Practitioner

## 2019-10-07 MED ORDER — FUROSEMIDE 20 MG PO TABS: 20.0000 mg | ORAL_TABLET | Freq: Every day | ORAL | 3 refills | Status: DC

## 2019-10-07 MED ORDER — AMLODIPINE BESYLATE 5 MG PO TABS: 5.0000 mg | ORAL_TABLET | Freq: Every day | ORAL | 3 refills | Status: DC

## 2019-11-05 ENCOUNTER — Encounter: Payer: Self-pay | Admitting: Nurse Practitioner

## 2019-11-05 ENCOUNTER — Other Ambulatory Visit: Payer: Self-pay

## 2019-11-05 ENCOUNTER — Ambulatory Visit (INDEPENDENT_AMBULATORY_CARE_PROVIDER_SITE_OTHER): Payer: Medicare Other | Admitting: Nurse Practitioner

## 2019-11-05 VITALS — BP 125/73 | HR 83 | Temp 98.7°F | Resp 17 | Ht 66.0 in | Wt 265.4 lb

## 2019-11-05 DIAGNOSIS — Z1159 Encounter for screening for other viral diseases: Secondary | ICD-10-CM | POA: Diagnosis not present

## 2019-11-05 DIAGNOSIS — M47817 Spondylosis without myelopathy or radiculopathy, lumbosacral region: Secondary | ICD-10-CM | POA: Diagnosis not present

## 2019-11-05 DIAGNOSIS — I10 Essential (primary) hypertension: Secondary | ICD-10-CM

## 2019-11-05 DIAGNOSIS — M47816 Spondylosis without myelopathy or radiculopathy, lumbar region: Secondary | ICD-10-CM | POA: Diagnosis not present

## 2019-11-05 DIAGNOSIS — I5042 Chronic combined systolic (congestive) and diastolic (congestive) heart failure: Secondary | ICD-10-CM | POA: Diagnosis not present

## 2019-11-05 DIAGNOSIS — Z13820 Encounter for screening for osteoporosis: Secondary | ICD-10-CM

## 2019-11-05 DIAGNOSIS — D649 Anemia, unspecified: Secondary | ICD-10-CM | POA: Diagnosis not present

## 2019-11-05 DIAGNOSIS — Z1231 Encounter for screening mammogram for malignant neoplasm of breast: Secondary | ICD-10-CM

## 2019-11-05 LAB — POCT URINALYSIS DIPSTICK
Bilirubin, UA: NEGATIVE
Blood, UA: NEGATIVE
Glucose, UA: NEGATIVE
Ketones, UA: NEGATIVE
Leukocytes, UA: NEGATIVE
Nitrite, UA: NEGATIVE
Protein, UA: NEGATIVE
Spec Grav, UA: 1.025 (ref 1.010–1.025)
Urobilinogen, UA: 0.2 E.U./dL
pH, UA: 5.5 (ref 5.0–8.0)

## 2019-11-05 MED ORDER — METHYLPREDNISOLONE 4 MG PO TBPK: ORAL_TABLET | ORAL | 0 refills | Status: AC

## 2019-11-05 MED ORDER — FUROSEMIDE 20 MG PO TABS: 20.0000 mg | ORAL_TABLET | Freq: Every day | ORAL | 3 refills | Status: DC

## 2019-11-05 MED ORDER — AMLODIPINE BESYLATE 5 MG PO TABS: 5.0000 mg | ORAL_TABLET | Freq: Every day | ORAL | 3 refills | Status: DC

## 2019-11-05 MED ORDER — LOSARTAN POTASSIUM 100 MG PO TABS: 100.0000 mg | ORAL_TABLET | Freq: Every day | ORAL | 3 refills | Status: DC

## 2019-11-05 MED ORDER — ASPIRIN EC 81 MG PO TBEC: 81.0000 mg | DELAYED_RELEASE_TABLET | Freq: Every day | ORAL | 3 refills | Status: DC

## 2019-11-05 NOTE — Progress Notes (Signed)
[address], [place]  [address] Phone:  [phone]   Fax:  [phone]   Acute Office Visit  Subjective:    Patient ID: Deborah Holmes [facility], female    DOB: [birth year], 69 y.o.   MRN: [id number]  Chief Complaint  Patient presents with  . Follow-up  . Leg Pain    X6 monhs    HPI Patient is in today for low back pain. She  has a past medical history of CHF (congestive heart failure) ([place]) and Hypertension.   Back Pain Patient presents for evaluation of low back problems. Symptoms have been present for several months and include pain in lower back  (aching in character; 10/10 in severity). Initial inciting event: none. She has Symptoms are worse in the morning, in the middle of the day and in the evening. Alleviating factors identifiable by the patient are sitting. Aggravating factors identifiable by the patient are bending backwards and washing dishes or cleaning and with walking. Treatments initiated by the patient: topical creams i.e. icy hot and hot linament . Previous lower back problems: yes with fall in March 2021. Previous work up: [employer]. Previous treatments: none.   Past Medical History:  Diagnosis Date  . CHF (congestive heart failure) ([place])   . Hypertension     Past Surgical History:  Procedure Laterality Date  . COLONOSCOPY    . ROTATOR CUFF REPAIR      Family History  Problem Relation Age of Onset  . CAD Mother   . Sickle cell anemia Mother   . CAD Father   . Diabetes type II Father   . Colon cancer Neg Hx   . Esophageal cancer Neg Hx   . Rectal cancer Neg Hx   . Stomach cancer Neg Hx     Social History   Socioeconomic History  . Marital status: Married    Spouse name: Not on file  . Number of children: Not on file  . Years of education: Not on file  . Highest education level: Not on file  Occupational History  . Not on file  Tobacco Use  . Smoking status: Passive Smoke Exposure - Never Smoker  .  Smokeless tobacco: Never Used  Vaping Use  . Vaping Use: Never used  Substance and Sexual Activity  . Alcohol use: No  . Drug use: No  . Sexual activity: Not on file  Other Topics Concern  . Not on file  Social History Narrative  . Not on file   Social Determinants of Health   Financial Resource Strain:   . Difficulty of Paying Living Expenses: Not on file  Food Insecurity:   . Worried About Charity fundraiser in the Last Year: Not on file  . Ran Out of Food in the Last Year: Not on file  Transportation Needs:   . Lack of Transportation (Medical): Not on file  . Lack of Transportation (Non-Medical): Not on file  Physical Activity:   . Days of Exercise per Week: Not on file  . Minutes of Exercise per Session: Not on file  Stress:   . Feeling of Stress : Not on file  Social Connections:   . Frequency of Communication with Friends and Family: Not on file  . Frequency of Social Gatherings with Friends and Family: Not on file  . Attends Religious Services: Not on file  . Active Member of Clubs or Organizations: Not on file  . Attends Archivist  Meetings: Not on file  . Marital Status: Not on file  Intimate Partner Violence:   . Fear of Current or Ex-Partner: Not on file  . Emotionally Abused: Not on file  . Physically Abused: Not on file  . Sexually Abused: Not on file    Outpatient Medications Prior to Visit  Medication Sig Dispense Refill  . carvedilol (COREG) 25 MG tablet Take 1 tablet (25 mg total) by mouth 2 (two) times daily. 60 tablet 11  . cholecalciferol (VITAMIN D) 25 MCG tablet Take 1 tablet (1,000 Units total) by mouth daily. 30 tablet 1  . HYDROcodone-acetaminophen (NORCO/VICODIN) 5-325 MG tablet Take 1 tablet by mouth every 4 (four) hours as needed. 8 tablet 0  . Multiple Vitamin (MULTIVITAMIN WITH MINERALS) TABS tablet Take 1 tablet by mouth daily. 30 tablet 0  . vitamin B-12 1000 MCG tablet Take 1 tablet (1,000 mcg total) by mouth daily. 30 tablet  0  . amLODipine (NORVASC) 5 MG tablet Take 1 tablet (5 mg total) by mouth daily. 90 tablet 3  . aspirin EC 81 MG tablet Take 1 tablet (81 mg total) by mouth daily. 90 tablet 3  . furosemide (LASIX) 20 MG tablet Take 1 tablet (20 mg total) by mouth daily. 90 tablet 3  . losartan (COZAAR) 100 MG tablet Take 100 mg by mouth daily.     No facility-administered medications prior to visit.    No Known Allergies  Review of Systems     Objective:    Physical Exam Constitutional:      Appearance: She is obese.  HENT:     Head: Normocephalic and atraumatic.     Nose: Nose normal.     Mouth/Throat:     Mouth: Mucous membranes are moist.  Cardiovascular:     Rate and Rhythm: Normal rate.     Pulses: Normal pulses.  Pulmonary:     Effort: Pulmonary effort is normal.  Musculoskeletal:     Cervical back: Normal range of motion.     Lumbar back: Tenderness present. Decreased range of motion.  Skin:    General: Skin is warm and dry.     Capillary Refill: Capillary refill takes less than 2 seconds.  Neurological:     General: No focal deficit present.     Mental Status: She is alert and oriented to person, place, and time.  Psychiatric:        Mood and Affect: Mood normal.        Behavior: Behavior normal.        Thought Content: Thought content normal.        Judgment: Judgment normal.     BP 125/73 (BP Location: Left Arm, Patient Position: Sitting, Cuff Size: Large)   Pulse 83   Temp 98.7 F (37.1 C)   Resp 17   Ht 5\' 6"  (1.676 m)   Wt 265 lb 6.4 oz (120.4 kg)   SpO2 98%   BMI 42.84 kg/m  Wt Readings from Last 3 Encounters:  11/05/19 265 lb 6.4 oz (120.4 kg)  05/13/19 263 lb (119.3 kg)  04/07/19 263 lb (119.3 kg)    Health Maintenance Due  Topic Date Due  . TETANUS/TDAP  Never done  . MAMMOGRAM  Never done  . DEXA SCAN  Never done  . COVID-19 Vaccine (2 - Pfizer 2-dose series) 04/03/2019    There are no preventive care reminders to display for this  patient.   Lab Results  Component Value Date   TSH  1.080 03/23/2019   Lab Results  Component Value Date   WBC 5.4 11/05/2019   HGB 12.6 11/05/2019   HCT 38.3 11/05/2019   MCV 88 11/05/2019   PLT 334 11/05/2019   Lab Results  Component Value Date   NA 144 11/05/2019   K 4.4 11/05/2019   CO2 25 01/12/2019   GLUCOSE 110 (H) 11/05/2019   BUN 11 11/05/2019   CREATININE 0.91 11/05/2019   BILITOT 0.7 11/05/2019   ALKPHOS 103 11/05/2019   AST 11 11/05/2019   ALT 48 (H) 01/12/2019   PROT 7.8 11/05/2019   ALBUMIN 4.4 11/05/2019   CALCIUM 9.8 11/05/2019   ANIONGAP 8 01/12/2019   Lab Results  Component Value Date   CHOL 187 03/23/2019   Lab Results  Component Value Date   HDL 57 03/23/2019   Lab Results  Component Value Date   LDLCALC 104 (H) 03/23/2019   Lab Results  Component Value Date   TRIG 149 03/23/2019   Lab Results  Component Value Date   CHOLHDL 3.3 03/23/2019   No results found for: HGBA1C     Assessment & Plan:   Problem List Items Addressed This Visit      Cardiovascular and Mediastinum   Chronic combined systolic (congestive) and diastolic (congestive) heart failure (HCC)   Relevant Medications   aspirin EC 81 MG tablet   losartan (COZAAR) 100 MG tablet   amLODipine (NORVASC) 5 MG tablet   furosemide (LASIX) 20 MG tablet     Musculoskeletal and Integument   Facet hypertrophy of lumbosacral region   Relevant Orders   Ambulatory referral to Physical Therapy   Lumbar spondylosis - Primary Medrol Dosepak, imaging and physical therapy referral.   Relevant Medications   aspirin EC 81 MG tablet   methylPREDNISolone (MEDROL) 4 MG TBPK tablet   Other Relevant Orders   DG Lumbar Spine Complete   Ambulatory referral to Physical Therapy     Other   Obesity, Class III, BMI 40-49.9 (morbid obesity) (HCC)   Anemia   Relevant Orders   CBC with Differential/Platelet    Other Visit Diagnoses    Essential hypertension       Relevant Medications    aspirin EC 81 MG tablet   losartan (COZAAR) 100 MG tablet   amLODipine (NORVASC) 5 MG tablet   furosemide (LASIX) 20 MG tablet   Other Relevant Orders   Comp. Metabolic Panel (12)   Urinalysis Dipstick (Completed)   Screening for osteoporosis       Relevant Orders   DG Bone Density   Screening mammogram, encounter for       Relevant Orders   MM DIAG BREAST TOMO BILATERAL   Encounter for hepatitis C screening test for low risk patient       Relevant Orders   Hepatitis C antibody       Meds ordered this encounter  Medications  . aspirin EC 81 MG tablet    Sig: Take 1 tablet (81 mg total) by mouth daily.    Dispense:  90 tablet    Refill:  3    Order Specific Question:   Supervising Provider    Answer:   Tresa Garter [id number]  . losartan (COZAAR) 100 MG tablet    Sig: Take 1 tablet (100 mg total) by mouth daily.    Dispense:  90 tablet    Refill:  3    Order Specific Question:   Supervising Provider    Answer:   Doreene Burke,  OLUGBEMIGA E [[id number]]  . amLODipine (NORVASC) 5 MG tablet    Sig: Take 1 tablet (5 mg total) by mouth daily.    Dispense:  90 tablet    Refill:  3    Order Specific Question:   Supervising Provider    Answer:   Tresa Garter [id number]  . furosemide (LASIX) 20 MG tablet    Sig: Take 1 tablet (20 mg total) by mouth daily.    Dispense:  90 tablet    Refill:  3    Order Specific Question:   Supervising Provider    Answer:   Tresa Garter [id number]  . methylPREDNISolone (MEDROL) 4 MG TBPK tablet    Sig: Follow package instructions.    Dispense:  21 tablet    Refill:  0    Do not add to the "Automatic Refill" notification system.    Order Specific Question:   Supervising Provider    Answer:   Tresa Garter [[id number]]     [name], NP

## 2019-11-06 ENCOUNTER — Encounter: Payer: Self-pay | Admitting: Nurse Practitioner

## 2019-11-06 LAB — COMP. METABOLIC PANEL (12)
AST: 11 IU/L (ref 0–40)
Albumin/Globulin Ratio: 1.3 (ref 1.2–2.2)
Albumin: 4.4 g/dL (ref 3.8–4.8)
Alkaline Phosphatase: 103 IU/L (ref 44–121)
BUN/Creatinine Ratio: 12 (ref 12–28)
BUN: 11 mg/dL (ref 8–27)
Bilirubin Total: 0.7 mg/dL (ref 0.0–1.2)
Calcium: 9.8 mg/dL (ref 8.7–10.3)
Chloride: 105 mmol/L (ref 96–106)
Creatinine, Ser: 0.91 mg/dL (ref 0.57–1.00)
GFR calc Af Amer: 75 mL/min/{1.73_m2} (ref >59)
GFR calc non Af Amer: 65 mL/min/{1.73_m2} (ref >59)
Globulin, Total: 3.4 g/dL (ref 1.5–4.5)
Glucose: 110 mg/dL — ABNORMAL HIGH (ref 65–99)
Potassium: 4.4 mmol/L (ref 3.5–5.2)
Sodium: 144 mmol/L (ref 134–144)
Total Protein: 7.8 g/dL (ref 6.0–8.5)

## 2019-11-06 LAB — CBC WITH DIFFERENTIAL/PLATELET
Basophils Absolute: 0 10*3/uL (ref 0.0–0.2)
Basos: 0 %
EOS (ABSOLUTE): 0.1 10*3/uL (ref 0.0–0.4)
Eos: 2 %
Hematocrit: 38.3 % (ref 34.0–46.6)
Hemoglobin: 12.6 g/dL (ref 11.1–15.9)
Immature Grans (Abs): 0 10*3/uL (ref 0.0–0.1)
Immature Granulocytes: 0 %
Lymphocytes Absolute: 1.6 10*3/uL (ref 0.7–3.1)
Lymphs: 30 %
MCH: 29 pg (ref 26.6–33.0)
MCHC: 32.9 g/dL (ref 31.5–35.7)
MCV: 88 fL (ref 79–97)
Monocytes Absolute: 0.4 10*3/uL (ref 0.1–0.9)
Monocytes: 7 %
Neutrophils Absolute: 3.3 10*3/uL (ref 1.4–7.0)
Neutrophils: 61 %
Platelets: 334 10*3/uL (ref 150–450)
RBC: 4.35 x10E6/uL (ref 3.77–5.28)
RDW: 12.6 % (ref 11.7–15.4)
WBC: 5.4 10*3/uL (ref 3.4–10.8)

## 2019-11-06 LAB — HEPATITIS C ANTIBODY: Hep C Virus Ab: <0.1 s/co ratio (ref 0.0–0.9)

## 2019-11-09 ENCOUNTER — Other Ambulatory Visit: Payer: Self-pay

## 2019-11-09 ENCOUNTER — Ambulatory Visit (HOSPITAL_COMMUNITY)
Admission: RE | Admit: 2019-11-09 | Discharge: 2019-11-09 | Disposition: A | Discharge status: Home / Self Care | Payer: Medicare Other | Source: Ambulatory Visit | Attending: Nurse Practitioner | Admitting: Nurse Practitioner

## 2019-11-09 DIAGNOSIS — M47816 Spondylosis without myelopathy or radiculopathy, lumbar region: Secondary | ICD-10-CM | POA: Diagnosis not present

## 2019-11-09 DIAGNOSIS — M545 Low back pain, unspecified: Secondary | ICD-10-CM | POA: Diagnosis not present

## 2019-11-09 NOTE — Progress Notes (Signed)
Pt was  called to discuss her lab results. Pt did not answer so a message was left for her to give [place] a call back.

## 2019-11-16 ENCOUNTER — Telehealth: Payer: Self-pay | Admitting: Nurse Practitioner

## 2019-11-16 ENCOUNTER — Other Ambulatory Visit: Payer: Self-pay | Admitting: Nurse Practitioner

## 2019-11-16 MED ORDER — METHYLPREDNISOLONE 4 MG PO TABS: 4.0000 mg | ORAL_TABLET | Freq: Every day | ORAL | 0 refills | Status: AC

## 2019-11-16 NOTE — Telephone Encounter (Signed)
Please make Deborah Holmes aware that I will give medrol 4 mg x7 additional days but she can not take this long term due to potential side effects. She has a PT order please check on this thanks

## 2019-11-16 NOTE — Telephone Encounter (Signed)
Spoke w/ pt and informed of refill, pt reported that her PT appt is 12/02/19 @ 9am.

## 2019-12-02 ENCOUNTER — Other Ambulatory Visit: Payer: Self-pay

## 2019-12-02 ENCOUNTER — Ambulatory Visit: Payer: Medicare Other | Attending: Nurse Practitioner

## 2019-12-02 DIAGNOSIS — G8929 Other chronic pain: Secondary | ICD-10-CM

## 2019-12-02 DIAGNOSIS — M545 Low back pain, unspecified: Secondary | ICD-10-CM | POA: Diagnosis not present

## 2019-12-02 DIAGNOSIS — R262 Difficulty in walking, not elsewhere classified: Secondary | ICD-10-CM | POA: Diagnosis not present

## 2019-12-02 DIAGNOSIS — M6281 Muscle weakness (generalized): Secondary | ICD-10-CM | POA: Diagnosis not present

## 2019-12-02 NOTE — Therapy (Addendum)
[place], [place], [address] Phone: [phone]   Fax:  [phone]  Physical Therapy Evaluation  Patient Details  Name: Margret Moat MRN: [id number] Date of Birth: 1950-06-08 Referring Provider (PT): [name], NP   Encounter Date: 12/02/2019   PT End of Session - 12/02/19 0900    Visit Number 1    Number of Visits 13    Date for PT Re-Evaluation 01/16/20    Authorization Type [employer] at visit 6 and visit 10; PN visit 10; KX modifier visit 15    PT Start Time 0914    PT Stop Time 1002    PT Time Calculation (min) 48 min    Activity Tolerance Patient tolerated treatment well    Behavior During Therapy [facility] for tasks assessed/performed           Past Medical History:  Diagnosis Date  . CHF (congestive heart failure) ([place])   . Hypertension     Past Surgical History:  Procedure Laterality Date  . COLONOSCOPY    . ROTATOR CUFF REPAIR      There were no vitals filed for this visit.    Subjective Assessment - 12/02/19 0917    Subjective "It just hurts" Patient reports having pain for "a while" in her low back down to bilateral lower extremities. Patient reports using a SPC but the rubber end wore out and she has not gotten a new one yet. She states she does plan to get a new SPC soon when asked and was encouraged to do so to reduce fall risk. Pt explains having to negotiate stairs each Sunday for church and states that she gets up and down them but has difficulty. "I clean everything in the house once a week at least. I start at 6am and do everything - like bathroom, sweeping, and all that. I don't get done until 11am because I take breaks because it hurts."    Pertinent History see extensive PMH above. L knee osteoarthritis    Limitations Sitting;Walking;Standing;House hold activities   doesn't walk too often   How long can you sit comfortably? "About 10 minutes.  My couch is low so I have to sit on 2 pillows and rock to get up"    How long can you stand comfortably? "About 15 or 20 minutes"    How long can you walk comfortably? "If I'm pushing something, I'm alright" referring to shopping cart at grocery store. About 10 min if without BUE support    Diagnostic tests Imaging lumbar spine 11/09/2019: multilevel degenerative change without acute abnormality. L knee 03/23/2019: Progressive 3 compartmental left knee osteoarthritis, most severe in the lateral and patellofemoral compartments.    Patient Stated Goals "My exercises. I'm very active. Clean up the yard. Getting out of the car." Pt was doing squats previously at a senior center and aerobic exercise that has been limited due to pain.    Currently in Pain? Yes    Pain Score 10-Worst pain ever    Pain Location Back    Pain Orientation Mid;Lower    Pain Descriptors / Indicators Aching    Pain Type Chronic pain   2 years   Pain Radiating Towards Sometimes pain in B upper legs (does not go distal to knees)    Pain Onset More than a month ago    Pain Frequency Intermittent   pain medication has been helpful   Aggravating Factors  "Making  the bed, sweep, when I'm cleaning" Pain is worse in standing    Pain Relieving Factors Pain medication    Effect of Pain on Daily Activities Difficulty performing household activities and doing regular exercise routine              [employer] PT Assessment - 12/02/19 0001      Assessment   Medical Diagnosis Lumbar spondylosis M47.816, Facet hypertrophy of lumbosacral region M47.817    Referring Provider (PT) [name], NP    Onset Date/Surgical Date --   2 years   Hand Dominance Right    Next MD Visit Nothing scheduled    Prior Therapy No      Precautions   Precautions None      Restrictions   Weight Bearing Restrictions No      Balance Screen   Has the patient fallen in the past 6 months No    Has the patient had a decrease in activity level because of  a fear of falling?  Yes    Is the patient reluctant to leave their home because of a fear of falling?  Yes      [place] Private residence    Living Arrangements Spouse/significant other   husband   Available Help at Discharge Other (Comment)   husband has health problems as well   Type of [place] to enter    Entrance Stairs-Number of Steps 3    Entrance Stairs-Rails None    Home Layout One level    [place] - single point   [facility] sometimes   Additional Comments Floor is slippery at home      Prior Function   Level of [place] Retired    Leisure Do exercises, walk, yardwork/cleaning      Cognition   Overall Cognitive Status Within Functional Limits for tasks assessed      Observation/Other Assessments   Observations Antalgic gait with decreased stance time on RLE; stooped standing posture with ambulation. Pt presents with significant bilateral knee valgus.    Focus on Therapeutic Outcomes (FOTO)  46% limitation; predicted 36% limitation      ROM / Strength   AROM / PROM / Strength AROM;Strength;PROM      AROM   AROM Assessment Site Lumbar;Knee    Right/Left Knee Right;Left    Right Knee Flexion 100    Left Knee Flexion 85    Lumbar Flexion Can reach floor with fingertips    Lumbar Extension Limited with pain during attempted EXT    Lumbar - Right Side Bend R lateral knee joint line with pain in lumbar spine; L lumbar ROT compensation    Lumbar - Left Side Bend L lateral knee joint line with pain in lumbar spine; R lumbar ROT compensation    Lumbar - Right Rotation WFL with pain in lumbar spine at end range rotation    Lumbar - Left Rotation WFL      PROM   PROM Assessment Site Knee    Right/Left Knee Right;Left    Right Knee Flexion 104    Left Knee Flexion Attempted but painful      Strength   Strength Assessment Site Hip;Knee;Ankle    Right/Left Hip Right;Left    Right Hip  Flexion 3+/5   pain in low back   Left Hip Flexion 4-/5    Right/Left Knee Right;Left    Right Knee  Flexion 3+/5    Right Knee Extension 4-/5    Left Knee Flexion 4-/5    Left Knee Extension 4-/5    Right/Left Ankle Right;Left    Right Ankle Dorsiflexion 4/5    Right Ankle Plantar Flexion --   modified test in sitting   Left Ankle Dorsiflexion 4/5    Left Ankle Plantar Flexion 4/5   modified test in sitting     Palpation   Palpation comment TTP along SP of lumbar spine                      Objective measurements completed on examination: See above findings.       [place] Adult PT Treatment/Exercise - 12/02/19 0001      Self-Care   Self-Care Other Self-Care Comments    Other Self-Care Comments  Patient education regarding HEP, reviewed FOTO score and potential progress with skilled PT, anatomy of condition (facet hypertrophy and lumbar spondylosis), POC, and flexion to relieve lumbar symptoms. Encouraged pt to get a new SPC or replace the rubber on the bottom of hers based on her description of rubber being worn out on her current SPC.      Exercises   Exercises Lumbar      Lumbar Exercises: Seated   Other Seated Lumbar Exercises Posterior pelvic tilt x 5 with verbal and tactile cues    Other Seated Lumbar Exercises Lumbar forward and lateral flexion using green swiss ball 5 x each direction      Lumbar Exercises: Supine   Pelvic Tilt Limitations Attempted hooklying PPT but pt had difficulty performing even with tactile and verbal cues. Attempted seated at [facility] later in session instead for now.    Bent Knee Raise Limitations Attempted alternating marches in hooklying but pt reported pain in low back and L knee. Performed seated at [facility] later.      Knee/Hip Exercises: Seated   Marching Strengthening;Both;5 reps    Marching Limitations Pt still had some tolerable pain in L knee but not in LBP performing alternating marches in sitting vs supine                   PT Education - 12/02/19 1025    Education Details Patient education regarding HEP, reviewed FOTO score and potential progress with skilled PT, anatomy of condition (facet hypertrophy and lumbar spondylosis), POC, and flexion to relieve lumbar symptoms.    Person(s) Educated Patient    Methods Explanation;Demonstration;Tactile cues;Verbal cues;Handout    Comprehension Verbalized understanding;Returned demonstration;Verbal cues required;Tactile cues required;Need further instruction            PT Short Term Goals - 12/02/19 1320      PT SHORT TERM GOAL #1   Title Patient will be independent with initial HEP.    Baseline Pt provided with initial HEP during evaluation 12/02/2019.    Time 3    Period Weeks    Status New    Target Date 12/23/19      PT SHORT TERM GOAL #2   Title Patient will be able to tolerate sitting for at least 20 minutes without having to readjust due to increased symptoms/low back pain.    Baseline Pt reports being able to sit for 10 minutes before having to readjust due to LBP.    Time 3    Period Weeks    Status New    Target Date 12/23/19      PT SHORT TERM GOAL #3  Title Patient will present with at least 4/5 R hip FL MMT without low back pain.    Baseline 3+/5 and pain reported in low back with resistance    Time 3    Period Weeks    Status New    Target Date 12/23/19      PT SHORT TERM GOAL #4   Title Patient will be able to ascend/descend 3 6-inch steps without BUE safely without LBP to allow for safe stair negotiation to enter/exit home.    Baseline Pt is able to negotiate stairs at home and church but has difficulty. 3 steps at home do not have handrails. Pt reports "limited a lot" to "climbing flight of stairs" on FOTO.    Time 3    Period Weeks    Status New    Target Date 12/23/19             PT Long Term Goals - 12/02/19 1340      PT LONG TERM GOAL #1   Title Patient will be independent with advanced HEP.     Baseline Pt provided with initial HEP during evaluation 12/02/2019.    Time 6    Period Weeks    Status New    Target Date 01/13/20      PT LONG TERM GOAL #2   Title Patient will be able to tolerate sitting for at least 45 minutes without having to readjust due to increased symptoms/low back pain.    Baseline Pt reports being able to sit for 10 minutes before having to readjust due to LBP.    Time 6    Period Weeks    Status New    Target Date 01/13/20      PT LONG TERM GOAL #3   Title Patient will present with at least 4+/5 R hip FL MMT without low back pain.    Baseline 3+/5 and pain reported in low back with resistance    Time 6    Period Weeks    Status New    Target Date 01/13/20      PT LONG TERM GOAL #4   Title Patient will be able to ascend/descend 12 6-inch steps with BUE as needed safely without LBP to allow for safe stair negotiation to enter/exit church each Sunday.    Baseline Pt is able to negotiate stairs at home and church (unclear if HR present at church) but has difficulty. Pt reports "limited a lot" to "climbing flight of stairs" on FOTO.    Time 6    Period Weeks    Status New    Target Date 01/13/20      PT LONG TERM GOAL #5   Title Patient will be able to tolerate static standing and walking for at least 45 minutes each on level ground without significant increase in pain or requiring seated rest break to allow for improved tolerance to functional activity.    Baseline 15-20 min standing and 10 min walking without BUE support tolerated. It takes pt 6 hours (6-11am) to clean her house 1x/week due to taking frequent breaks because of pain.    Time 6    Period Weeks    Status New    Target Date 01/13/20      Additional Long Term Goals   Additional Long Term Goals Yes      PT LONG TERM GOAL #6   Title Pt's FOTO score will improve from 46% limitation to 36% limitation or better.  Baseline 46% limited (54% function); predicted 36% limitation (64% function)     Time 6    Period Weeks    Status New    Target Date 01/13/20                  Plan - 12/02/19 0900    Clinical Impression Statement Patient is a 69 year old female who presents to [facility] with complaints of low back pain with pain but no radicular symptoms in BLE. Referral notes lumbar spondylosis and lumbosacral facet hypertrophy. Imaging of lumbar spine 11/09/2019 reveals multilevel degenerative change without acute abnormality. Patient presents with significant B knee valgus and antalgic gait secondary to L knee and low back pain. Pt experienced pain during active lumbar B SB, R Rot, and EXT which is consistent with lumbar spondylosis and facet dysfunction. Patient should benefit from skilled PT intervention to strengthen core and BLE, improve tolerance to functional/household activities, and return to regular exercise routine.    Personal Factors and Comorbidities Comorbidity 1;Comorbidity 2;Comorbidity 3+;Age;Past/Current Experience;Time since onset of injury/illness/exacerbation    Comorbidities See extensive PMH above    Examination-Activity Limitations Bed Mobility;Bend;Carry;Lift;Locomotion Level;Sit;Sleep;Squat;Stairs;Stand;Toileting;Transfers    Examination-Participation Restrictions Church;Cleaning;Community Activity;Driving;Meal Prep;Shop    Stability/Clinical Decision Making Stable/Uncomplicated    Clinical Decision Making Low    Rehab Potential Good    PT Frequency 2x / week    PT Duration 6 weeks    PT Treatment/Interventions ADLs/Self Care Home Management;Aquatic Therapy;Cryotherapy;Electrical Stimulation;Iontophoresis 4mg /ml Dexamethasone;Moist Heat;Neuromuscular re-education;Balance training;Therapeutic exercise;Therapeutic activities;Functional mobility training;Stair training;Gait training;Patient/family education;Manual techniques;Energy conservation;Dry needling;Passive range of motion    PT Next Visit Plan Review and update HEP. Assess sensation and high level  balance. 5xSTS and TUG. Manual therapy along lumbar spine. Core and BLE strengthening (attempt PPT again). Body mechanics when cleaning (making beds, sweeping, mopping, etc)    PT Home Exercise Plan [id number] - seated lumbar flexion, seated PPT, seated alternating marches    Consulted and Agree with Plan of Care Patient           Patient will benefit from skilled therapeutic intervention in order to improve the following deficits and impairments:  Difficulty walking, Decreased activity tolerance, Decreased balance, Decreased mobility, Decreased strength, Pain, Obesity, Improper body mechanics, Postural dysfunction, Decreased endurance, Decreased range of motion  Visit Diagnosis: Chronic midline low back pain, unspecified whether sciatica present - Plan: PT plan of care cert/re-cert  Difficulty in walking, not elsewhere classified - Plan: PT plan of care cert/re-cert  Muscle weakness (generalized) - Plan: PT plan of care cert/re-cert     Problem List Patient Active Problem List   Diagnosis Date Noted  . Primary osteoarthritis of left knee 04/07/2019  . Body mass index 40.0-44.9, adult ([place]) 04/07/2019  . Morbid obesity ([place]) 04/07/2019  . DJD (degenerative joint disease) of knee 03/24/2019  . Lumbar spondylosis 03/24/2019  . Facet hypertrophy of lumbosacral region 03/24/2019  . Chronic combined systolic (congestive) and diastolic (congestive) heart failure ([place]) 01/07/2019  . Obesity, Class III, BMI 40-49.9 (morbid obesity) ([place]) 01/07/2019  . Suspected COVID-19 virus infection 01/07/2019  . Acute respiratory failure due to COVID-19 ([place]) 01/07/2019  . Anemia 12/03/2014  . CHF exacerbation ([place]) 12/03/2014  . Hypertension 12/03/2014  . Acute on chronic combined systolic and diastolic heart failure ([place])   . Rotator cuff syndrome of right shoulder 04/26/2012  . S/P TAH-[id number] 04/26/2012  . Obesity 04/26/2012  . Chronic systolic heart failure ([place]) 10/17/2010  . Hypertensive heart  disease 04/16/2000  Haydee Monica, PT, DPT 12/02/19 3:30 PM  [facility] [address] [place], [place], [address] Phone: [phone]   Fax:  [phone]  Name: Emilya Justen [facility] MRN: [id number] Date of Birth: [birth year]

## 2019-12-10 ENCOUNTER — Ambulatory Visit: Payer: Medicare Other

## 2019-12-10 ENCOUNTER — Other Ambulatory Visit: Payer: Self-pay

## 2019-12-10 DIAGNOSIS — M6281 Muscle weakness (generalized): Secondary | ICD-10-CM | POA: Diagnosis not present

## 2019-12-10 DIAGNOSIS — R262 Difficulty in walking, not elsewhere classified: Secondary | ICD-10-CM

## 2019-12-10 DIAGNOSIS — G8929 Other chronic pain: Secondary | ICD-10-CM

## 2019-12-10 DIAGNOSIS — M545 Low back pain, unspecified: Secondary | ICD-10-CM | POA: Diagnosis not present

## 2019-12-10 NOTE — Therapy (Signed)
[place], [place], [address] Phone: [phone]   Fax:  [phone]  Physical Therapy Treatment  Patient Details  Name: Deborah Holmes MRN: [id number] Date of Birth: 09-Aug-1950 Referring Provider (PT): [name], NP   Encounter Date: 12/10/2019   PT End of Session - 12/10/19 0941    Visit Number 2    Number of Visits 13    Date for PT Re-Evaluation 01/16/20    Authorization Type [place] at visit 6 and visit 10; PN visit 10; KX modifier visit 15    PT Start Time 0845    PT Stop Time 0930    PT Time Calculation (min) 45 min    Activity Tolerance Patient tolerated treatment well;Patient limited by pain    Behavior During Therapy [facility] for tasks assessed/performed           Past Medical History:  Diagnosis Date  . CHF (congestive heart failure) ([place])   . Hypertension     Past Surgical History:  Procedure Laterality Date  . COLONOSCOPY    . ROTATOR CUFF REPAIR      There were no vitals filed for this visit.   Subjective Assessment - 12/10/19 0847    Subjective Patient reports her back hurts still, but she has been doing her exercises some.    Pertinent History see extensive PMH above. L knee osteoarthritis    Limitations Sitting;Walking;Standing;House hold activities    How long can you sit comfortably? "About 10 minutes. My couch is low so I have to sit on 2 pillows and rock to get up"    How long can you stand comfortably? "About 15 or 20 minutes"    How long can you walk comfortably? "If I'm pushing something, I'm alright" referring to shopping cart at grocery store. About 10 min if without BUE support    Diagnostic tests Imaging lumbar spine 11/09/2019: multilevel degenerative change without acute abnormality. L knee 03/23/2019: Progressive 3 compartmental left knee osteoarthritis, most severe in the lateral and patellofemoral compartments.    Patient Stated  Goals "My exercises. I'm very active. Clean up the yard. Getting out of the car." Pt was doing squats previously at a senior center and aerobic exercise that has been limited due to pain.    Currently in Pain? Yes    Pain Score 8     Pain Location Back    Pain Orientation Mid;Lower    Pain Descriptors / Indicators Aching    Pain Type Chronic pain    Pain Onset More than a month ago    Pain Frequency Constant    Multiple Pain Sites Yes    Pain Score 8    Pain Location Knee    Pain Orientation Left    Pain Descriptors / Indicators Sharp    Pain Type Chronic pain    Pain Onset More than a month ago    Pain Frequency Constant              [facility] PT Assessment - 12/10/19 0001      Sensation   Light Touch Appears Intact      Transfers   Five time sit to stand comments  30 sec                         [facility] Adult PT Treatment/Exercise - 12/10/19 0001      Lumbar Exercises: Seated   Other  Seated Lumbar Exercises seated march 2 x 10      Lumbar Exercises: Supine   AB Set Limitations 2 x 10    Pelvic Tilt Limitations Attempted, heavy verbal and tactile cues    Clam Limitations 2 x 10 green TB    Bridge Limitations 2 x 10 partial range    Straight Leg Raises Limitations 2 x 10    Other Supine Lumbar Exercises LTR 2 min      Manual Therapy   Manual therapy comments STM to lumbar parapsinals, QL                  PT Education - 12/10/19 0938    Education Details education on updated HEP. education on proper body mechanics during ADLs.    Person(s) Educated Patient    Methods Explanation;Demonstration;Tactile cues;Verbal cues;Handout    Comprehension Verbalized understanding;Returned demonstration;Verbal cues required;Tactile cues required;Need further instruction            PT Short Term Goals - 12/02/19 1320      PT SHORT TERM GOAL #1   Title Patient will be independent with initial HEP.    Baseline Pt provided with initial HEP during evaluation  12/02/2019.    Time 3    Period Weeks    Status New    Target Date 12/23/19      PT SHORT TERM GOAL #2   Title Patient will be able to tolerate sitting for at least 20 minutes without having to readjust due to increased symptoms/low back pain.    Baseline Pt reports being able to sit for 10 minutes before having to readjust due to LBP.    Time 3    Period Weeks    Status New    Target Date 12/23/19      PT SHORT TERM GOAL #3   Title Patient will present with at least 4/5 R hip FL MMT without low back pain.    Baseline 3+/5 and pain reported in low back with resistance    Time 3    Period Weeks    Status New    Target Date 12/23/19      PT SHORT TERM GOAL #4   Title Patient will be able to ascend/descend 3 6-inch steps without BUE safely without LBP to allow for safe stair negotiation to enter/exit home.    Baseline Pt is able to negotiate stairs at home and church but has difficulty. 3 steps at home do not have handrails. Pt reports "limited a lot" to "climbing flight of stairs" on FOTO.    Time 3    Period Weeks    Status New    Target Date 12/23/19             PT Long Term Goals - 12/02/19 1340      PT LONG TERM GOAL #1   Title Patient will be independent with advanced HEP.    Baseline Pt provided with initial HEP during evaluation 12/02/2019.    Time 6    Period Weeks    Status New    Target Date 01/13/20      PT LONG TERM GOAL #2   Title Patient will be able to tolerate sitting for at least 45 minutes without having to readjust due to increased symptoms/low back pain.    Baseline Pt reports being able to sit for 10 minutes before having to readjust due to LBP.    Time 6    Period Weeks  Status New    Target Date 01/13/20      PT LONG TERM GOAL #3   Title Patient will present with at least 4+/5 R hip FL MMT without low back pain.    Baseline 3+/5 and pain reported in low back with resistance    Time 6    Period Weeks    Status New    Target Date 01/13/20       PT LONG TERM GOAL #4   Title Patient will be able to ascend/descend 12 6-inch steps with BUE as needed safely without LBP to allow for safe stair negotiation to enter/exit church each Sunday.    Baseline Pt is able to negotiate stairs at home and church (unclear if HR present at church) but has difficulty. Pt reports "limited a lot" to "climbing flight of stairs" on FOTO.    Time 6    Period Weeks    Status New    Target Date 01/13/20      PT LONG TERM GOAL #5   Title Patient will be able to tolerate static standing and walking for at least 45 minutes each on level ground without significant increase in pain or requiring seated rest break to allow for improved tolerance to functional activity.    Baseline 15-20 min standing and 10 min walking without BUE support tolerated. It takes pt 6 hours (6-11am) to clean her house 1x/week due to taking frequent breaks because of pain.    Time 6    Period Weeks    Status New    Target Date 01/13/20      Additional Long Term Goals   Additional Long Term Goals Yes      PT LONG TERM GOAL #6   Title Pt's FOTO score will improve from 46% limitation to 36% limitation or better.    Baseline 46% limited (54% function); predicted 36% limitation (64% function)    Time 6    Period Weeks    Status New    Target Date 01/13/20                 Plan - 12/10/19 0942    Clinical Impression Statement Patient tolerated session well today without increased reports of low back pain during sitting and supine activities, though occasional reports of L knee pain during hooklying strengthening exercises. Patient demonstrates poor TA activation requiring continued emphasis at future sessions. Patient denied any back pain at conclusion of session. Patient's score on the five time sit to stand indicates high fall risk signify continued focus on improving overall strength and balance.    Personal Factors and Comorbidities Comorbidity 1;Comorbidity 2;Comorbidity  3+;Age;Past/Current Experience;Time since onset of injury/illness/exacerbation    Comorbidities See extensive PMH above    Examination-Activity Limitations Bed Mobility;Bend;Carry;Lift;Locomotion Level;Sit;Sleep;Squat;Stairs;Stand;Toileting;Transfers    Examination-Participation Restrictions Church;Cleaning;Community Activity;Driving;Meal Prep;Shop    Stability/Clinical Decision Making Stable/Uncomplicated    Rehab Potential Good    PT Treatment/Interventions ADLs/Self Care Home Management;Aquatic Therapy;Cryotherapy;Electrical Stimulation;Iontophoresis 4mg /ml Dexamethasone;Moist Heat;Neuromuscular re-education;Balance training;Therapeutic exercise;Therapeutic activities;Functional mobility training;Stair training;Gait training;Patient/family education;Manual techniques;Energy conservation;Dry needling;Passive range of motion    PT Next Visit Plan Review updated HEP, core and hip strengthening, sit to stand from elevated height.    PT Home Exercise Plan [email or web address]    Consulted and Agree with Plan of Care Patient           Patient will benefit from skilled therapeutic intervention in order to improve the following deficits and impairments:  Difficulty walking,Decreased activity tolerance,Decreased balance,Decreased mobility,Decreased strength,Pain,Obesity,Improper  body mechanics,Postural dysfunction,Decreased endurance,Decreased range of motion  Visit Diagnosis: Chronic midline low back pain, unspecified whether sciatica present  Difficulty in walking, not elsewhere classified  Muscle weakness (generalized)     Problem List Patient Active Problem List   Diagnosis Date Noted  . Primary osteoarthritis of left knee 04/07/2019  . Body mass index 40.0-44.9, adult ([place]) 04/07/2019  . Morbid obesity ([place]) 04/07/2019  . DJD (degenerative joint disease) of knee 03/24/2019  . Lumbar spondylosis 03/24/2019  . Facet hypertrophy of lumbosacral region 03/24/2019  . Chronic combined systolic  (congestive) and diastolic (congestive) heart failure ([place]) 01/07/2019  . Obesity, Class III, BMI 40-49.9 (morbid obesity) ([place]) 01/07/2019  . Suspected COVID-19 virus infection 01/07/2019  . Acute respiratory failure due to COVID-19 ([place]) 01/07/2019  . Anemia 12/03/2014  . CHF exacerbation ([place]) 12/03/2014  . Hypertension 12/03/2014  . Acute on chronic combined systolic and diastolic heart failure ([place])   . Rotator cuff syndrome of right shoulder 04/26/2012  . S/P TAH-BSO 04/26/2012  . Obesity 04/26/2012  . Chronic systolic heart failure ([place]) 10/17/2010  . Hypertensive heart disease 04/16/2000  Gwendolyn Grant, PT, DPT, ATC 12/10/19 9:51 AM  [facility] [address] [place], [place], [address] Phone: [phone]   Fax:  [phone]  Name: Deborah Holmes [facility] MRN: [id number] Date of Birth: 12/16/1950

## 2019-12-15 ENCOUNTER — Other Ambulatory Visit: Payer: Self-pay

## 2019-12-15 ENCOUNTER — Ambulatory Visit: Payer: Medicare Other

## 2019-12-15 DIAGNOSIS — R262 Difficulty in walking, not elsewhere classified: Secondary | ICD-10-CM | POA: Diagnosis not present

## 2019-12-15 DIAGNOSIS — M6281 Muscle weakness (generalized): Secondary | ICD-10-CM

## 2019-12-15 DIAGNOSIS — M545 Low back pain, unspecified: Secondary | ICD-10-CM | POA: Diagnosis not present

## 2019-12-15 DIAGNOSIS — G8929 Other chronic pain: Secondary | ICD-10-CM | POA: Diagnosis not present

## 2019-12-15 NOTE — Therapy (Signed)
[place], [place], [address] Phone: [phone]   Fax:  [phone]  Physical Therapy Treatment  Patient Details  Name: Deborah Holmes MRN: [id number] Date of Birth: 02/04/1950 Referring Provider (PT): [name], NP   Encounter Date: 12/15/2019   PT End of Session - 12/15/19 0941    Visit Number 3    Number of Visits 13    Date for PT Re-Evaluation 01/16/20    Authorization Type [place] at visit 6 and visit 10; PN visit 10; KX modifier visit 15    PT Start Time [phone]    PT Stop Time 1011    PT Time Calculation (min) 45 min    Activity Tolerance Patient tolerated treatment well;Patient limited by pain    Behavior During Therapy [facility] for tasks assessed/performed           Past Medical History:  Diagnosis Date  . CHF (congestive heart failure) ([place])   . Hypertension     Past Surgical History:  Procedure Laterality Date  . COLONOSCOPY    . ROTATOR CUFF REPAIR      There were no vitals filed for this visit.   Subjective Assessment - 12/15/19 0926    Subjective She reports stinging along her Rt. lower back and posterior hip, but "it's better than it was." Her Lt knee is still bothering her. She reports compliance with HEP and reports soreness in her core  after last session that lasted about a day.    Pertinent History see extensive PMH above. L knee osteoarthritis    Limitations Sitting;Walking;Standing;House hold activities    Diagnostic tests Imaging lumbar spine 11/09/2019: multilevel degenerative change without acute abnormality. L knee 03/23/2019: Progressive 3 compartmental left knee osteoarthritis, most severe in the lateral and patellofemoral compartments.    Patient Stated Goals "My exercises. I'm very active. Clean up the yard. Getting out of the car." Pt was doing squats previously at a senior center and aerobic exercise that has been limited due to pain.     Currently in Pain? Yes    Pain Score 7     Pain Location Back    Pain Orientation Lower;Right    Pain Descriptors / Indicators Other (Comment)   stinging   Pain Type Chronic pain    Pain Onset More than a month ago    Multiple Pain Sites Yes    Pain Score 8    Pain Location Knee    Pain Orientation Left    Pain Descriptors / Indicators Aching    Pain Type Chronic pain    Pain Onset More than a month ago    Pain Frequency Constant                             [facility] Adult PT Treatment/Exercise - 12/15/19 0001      Lumbar Exercises: Stretches   Sports administrator Limitations 60 sec each prone      Lumbar Exercises: Seated   Other Seated Lumbar Exercises stability ball rollout 2 min    Other Seated Lumbar Exercises pelvic tilts heavy cues 2 x 10      Lumbar Exercises: Supine   AB Set Limitations 1 x 10, 2 x 10 with bent knee fallout    Pelvic Tilt Limitations attempted, heavy cues    Other Supine Lumbar Exercises sciatic nerve glide Rt 2 x 4  Knee/Hip Exercises: Seated   Marching Limitations 2 x 10      Manual Therapy   Manual therapy comments STM to lumbar paraspinals, QL                  PT Education - 12/15/19 1007    Education Details reviewed HEP. Postural education.    Person(s) Educated Patient    Methods Explanation    Comprehension Verbalized understanding;Returned demonstration;Need further instruction            PT Short Term Goals - 12/02/19 1320      PT SHORT TERM GOAL #1   Title Patient will be independent with initial HEP.    Baseline Pt provided with initial HEP during evaluation 12/02/2019.    Time 3    Period Weeks    Status New    Target Date 12/23/19      PT SHORT TERM GOAL #2   Title Patient will be able to tolerate sitting for at least 20 minutes without having to readjust due to increased symptoms/low back pain.    Baseline Pt reports being able to sit for 10 minutes before having to readjust due to LBP.     Time 3    Period Weeks    Status New    Target Date 12/23/19      PT SHORT TERM GOAL #3   Title Patient will present with at least 4/5 R hip FL MMT without low back pain.    Baseline 3+/5 and pain reported in low back with resistance    Time 3    Period Weeks    Status New    Target Date 12/23/19      PT SHORT TERM GOAL #4   Title Patient will be able to ascend/descend 3 6-inch steps without BUE safely without LBP to allow for safe stair negotiation to enter/exit home.    Baseline Pt is able to negotiate stairs at home and church but has difficulty. 3 steps at home do not have handrails. Pt reports "limited a lot" to "climbing flight of stairs" on FOTO.    Time 3    Period Weeks    Status New    Target Date 12/23/19             PT Long Term Goals - 12/02/19 1340      PT LONG TERM GOAL #1   Title Patient will be independent with advanced HEP.    Baseline Pt provided with initial HEP during evaluation 12/02/2019.    Time 6    Period Weeks    Status New    Target Date 01/13/20      PT LONG TERM GOAL #2   Title Patient will be able to tolerate sitting for at least 45 minutes without having to readjust due to increased symptoms/low back pain.    Baseline Pt reports being able to sit for 10 minutes before having to readjust due to LBP.    Time 6    Period Weeks    Status New    Target Date 01/13/20      PT LONG TERM GOAL #3   Title Patient will present with at least 4+/5 R hip FL MMT without low back pain.    Baseline 3+/5 and pain reported in low back with resistance    Time 6    Period Weeks    Status New    Target Date 01/13/20      PT LONG TERM  GOAL #4   Title Patient will be able to ascend/descend 12 6-inch steps with BUE as needed safely without LBP to allow for safe stair negotiation to enter/exit church each Sunday.    Baseline Pt is able to negotiate stairs at home and church (unclear if HR present at church) but has difficulty. Pt reports "limited a lot" to  "climbing flight of stairs" on FOTO.    Time 6    Period Weeks    Status New    Target Date 01/13/20      PT LONG TERM GOAL #5   Title Patient will be able to tolerate static standing and walking for at least 45 minutes each on level ground without significant increase in pain or requiring seated rest break to allow for improved tolerance to functional activity.    Baseline 15-20 min standing and 10 min walking without BUE support tolerated. It takes pt 6 hours (6-11am) to clean her house 1x/week due to taking frequent breaks because of pain.    Time 6    Period Weeks    Status New    Target Date 01/13/20      Additional Long Term Goals   Additional Long Term Goals Yes      PT LONG TERM GOAL #6   Title Pt's FOTO score will improve from 46% limitation to 36% limitation or better.    Baseline 46% limited (54% function); predicted 36% limitation (64% function)    Time 6    Period Weeks    Status New    Target Date 01/13/20                 Plan - 12/15/19 0945    Clinical Impression Statement Patient responds well to manual therapy reporting a reduction in low back/Rt hip pain following intervention rated as 4/10. She has lingering tautness and palpable tenderness about Rt paraspinals requiring continued emphasis at future sessions. Patient requires heavy verbal and tactile cues for proper performance of pelvic tilt with ability to properly perform with continued practice in sitting, though unable to perform in supine. Patient denies any back or hip pain at conclusion of session.    Personal Factors and Comorbidities Comorbidity 1;Comorbidity 2;Comorbidity 3+;Age;Past/Current Experience;Time since onset of injury/illness/exacerbation    Comorbidities See extensive PMH above    Examination-Activity Limitations Bed Mobility;Bend;Carry;Lift;Locomotion Level;Sit;Sleep;Squat;Stairs;Stand;Toileting;Transfers    Examination-Participation Restrictions Church;Cleaning;Community  Activity;Driving;Meal Prep;Shop    Stability/Clinical Decision Making Stable/Uncomplicated    Rehab Potential Good    PT Treatment/Interventions ADLs/Self Care Home Management;Aquatic Therapy;Cryotherapy;Electrical Stimulation;Iontophoresis 4mg /ml Dexamethasone;Moist Heat;Neuromuscular re-education;Balance training;Therapeutic exercise;Therapeutic activities;Functional mobility training;Stair training;Gait training;Patient/family education;Manual techniques;Energy conservation;Dry needling;Passive range of motion    PT Next Visit Plan Update HEP. Gait training with cane, review pelvic tilt, progress core stabilization.    PT Home Exercise Plan [email or web address]    Consulted and Agree with Plan of Care Patient           Patient will benefit from skilled therapeutic intervention in order to improve the following deficits and impairments:  Difficulty walking,Decreased activity tolerance,Decreased balance,Decreased mobility,Decreased strength,Pain,Obesity,Improper body mechanics,Postural dysfunction,Decreased endurance,Decreased range of motion  Visit Diagnosis: Chronic midline low back pain, unspecified whether sciatica present  Difficulty in walking, not elsewhere classified  Muscle weakness (generalized)     Problem List Patient Active Problem List   Diagnosis Date Noted  . Primary osteoarthritis of left knee 04/07/2019  . Body mass index 40.0-44.9, adult ([place]) 04/07/2019  . Morbid obesity ([place]) 04/07/2019  . DJD (degenerative joint disease)  of knee 03/24/2019  . Lumbar spondylosis 03/24/2019  . Facet hypertrophy of lumbosacral region 03/24/2019  . Chronic combined systolic (congestive) and diastolic (congestive) heart failure ([place]) 01/07/2019  . Obesity, Class III, BMI 40-49.9 (morbid obesity) ([place]) 01/07/2019  . Suspected COVID-19 virus infection 01/07/2019  . Acute respiratory failure due to COVID-19 ([place]) 01/07/2019  . Anemia 12/03/2014  . CHF exacerbation ([place]) 12/03/2014  .  Hypertension 12/03/2014  . Acute on chronic combined systolic and diastolic heart failure ([place])   . Rotator cuff syndrome of right shoulder 04/26/2012  . S/P TAH-BSO 04/26/2012  . Obesity 04/26/2012  . Chronic systolic heart failure ([place]) 10/17/2010  . Hypertensive heart disease 04/16/2000   Gwendolyn Grant, PT, DPT, ATC 12/15/19 10:15 AM  [facility] [address] [place], [place], [address] Phone: [phone]   Fax:  [phone]  Name: Brady Plant [facility] MRN: [id number] Date of Birth: [birth year]

## 2019-12-17 ENCOUNTER — Other Ambulatory Visit: Payer: Self-pay

## 2019-12-17 ENCOUNTER — Ambulatory Visit: Payer: Medicare Other

## 2019-12-17 DIAGNOSIS — M6281 Muscle weakness (generalized): Secondary | ICD-10-CM

## 2019-12-17 DIAGNOSIS — G8929 Other chronic pain: Secondary | ICD-10-CM | POA: Diagnosis not present

## 2019-12-17 DIAGNOSIS — M545 Low back pain, unspecified: Secondary | ICD-10-CM | POA: Diagnosis not present

## 2019-12-17 DIAGNOSIS — R262 Difficulty in walking, not elsewhere classified: Secondary | ICD-10-CM

## 2019-12-17 NOTE — Therapy (Signed)
[place], [place], [address] Phone: [phone]   Fax:  [phone]  Physical Therapy Treatment  Patient Details  Name: Deborah Holmes MRN: [id number] Date of Birth: [birth year] Referring Provider (PT): [name], NP   Encounter Date: 12/17/2019   PT End of Session - 12/17/19 0630    Visit Number 4    Number of Visits 13    Date for PT Re-Evaluation 01/16/20    Authorization Type [place] at visit 6 and visit 10; PN visit 10; KX modifier visit 15    PT Start Time [id number]    PT Stop Time 1008    PT Time Calculation (min) 45 min    Activity Tolerance Patient tolerated treatment well;Patient limited by pain    Behavior During Therapy [facility] for tasks assessed/performed           Past Medical History:  Diagnosis Date  . CHF (congestive heart failure) ([place])   . Hypertension     Past Surgical History:  Procedure Laterality Date  . COLONOSCOPY    . ROTATOR CUFF REPAIR      There were no vitals filed for this visit.   Subjective Assessment - 12/17/19 0923    Subjective patient reports her back pain is better, but her Lt knee is hurting a lot more and is barely able to walk because the pain is so bad.She denies any back pain currently.    Pertinent History see extensive PMH above. L knee osteoarthritis    Diagnostic tests Imaging lumbar spine 11/09/2019: multilevel degenerative change without acute abnormality. L knee 03/23/2019: Progressive 3 compartmental left knee osteoarthritis, most severe in the lateral and patellofemoral compartments.    Currently in Pain? Yes    Pain Score 10-Worst pain ever    Pain Location Knee    Pain Orientation Left    Pain Descriptors / Indicators Aching    Pain Type Chronic pain    Pain Frequency Intermittent    Aggravating Factors  walking    Pain Relieving Factors sitting/rest    Multiple Pain Sites No              [facility] PT  Assessment - 12/17/19 0001      AROM   Lumbar Flexion can reach floor with fingertips    Lumbar Extension WFL pain free    Lumbar - Right Side Bend R lateral joint line    Lumbar - Left Side Bend L lateral joint line    Lumbar - Right Rotation WFL    Lumbar - Left Rotation WFL                         [facility] Adult PT Treatment/Exercise - 12/17/19 0001      Ambulation/Gait   Ambulation/Gait Yes    Ambulation/Gait Assistance 6: Modified independent (Device/Increase time)    Ambulation Distance (Feet) 15 Feet    Assistive device Large base quad cane    Gait Comments decreased stance time LLE      Lumbar Exercises: Seated   Other Seated Lumbar Exercises seated march 2 x 10    Other Seated Lumbar Exercises hip abduction blue TB 2 x 10      Lumbar Exercises: Supine   Bent Knee Raise Limitations 2 x 10      Lumbar Exercises: Sidelying   Clam Limitations 2 x 10      Knee/Hip Exercises: Seated  Long Arc Quad Limitations 2 x 10      Manual Therapy   Manual therapy comments STM to lumbar paraspinals/QL                  PT Education - 12/17/19 1001    Education Details Education on updated HEP. Education on recommendation to f/u with physician regarding ongoing Lt knee pain. Recommendation on utilizing rollator for ambulation when knee pain is severe. Education on proper fit of cane.    Person(s) Educated Patient    Methods Explanation;Demonstration;Verbal cues;Handout    Comprehension Verbalized understanding;Returned demonstration            PT Short Term Goals - 12/02/19 1320      PT SHORT TERM GOAL #1   Title Patient will be independent with initial HEP.    Baseline Pt provided with initial HEP during evaluation 12/02/2019.    Time 3    Period Weeks    Status New    Target Date 12/23/19      PT SHORT TERM GOAL #2   Title Patient will be able to tolerate sitting for at least 20 minutes without having to readjust due to increased symptoms/low back  pain.    Baseline Pt reports being able to sit for 10 minutes before having to readjust due to LBP.    Time 3    Period Weeks    Status New    Target Date 12/23/19      PT SHORT TERM GOAL #3   Title Patient will present with at least 4/5 R hip FL MMT without low back pain.    Baseline 3+/5 and pain reported in low back with resistance    Time 3    Period Weeks    Status New    Target Date 12/23/19      PT SHORT TERM GOAL #4   Title Patient will be able to ascend/descend 3 6-inch steps without BUE safely without LBP to allow for safe stair negotiation to enter/exit home.    Baseline Pt is able to negotiate stairs at home and church but has difficulty. 3 steps at home do not have handrails. Pt reports "limited a lot" to "climbing flight of stairs" on FOTO.    Time 3    Period Weeks    Status New    Target Date 12/23/19             PT Long Term Goals - 12/02/19 1340      PT LONG TERM GOAL #1   Title Patient will be independent with advanced HEP.    Baseline Pt provided with initial HEP during evaluation 12/02/2019.    Time 6    Period Weeks    Status New    Target Date 01/13/20      PT LONG TERM GOAL #2   Title Patient will be able to tolerate sitting for at least 45 minutes without having to readjust due to increased symptoms/low back pain.    Baseline Pt reports being able to sit for 10 minutes before having to readjust due to LBP.    Time 6    Period Weeks    Status New    Target Date 01/13/20      PT LONG TERM GOAL #3   Title Patient will present with at least 4+/5 R hip FL MMT without low back pain.    Baseline 3+/5 and pain reported in low back with resistance    Time 6  Period Weeks    Status New    Target Date 01/13/20      PT LONG TERM GOAL #4   Title Patient will be able to ascend/descend 12 6-inch steps with BUE as needed safely without LBP to allow for safe stair negotiation to enter/exit church each Sunday.    Baseline Pt is able to negotiate stairs  at home and church (unclear if HR present at church) but has difficulty. Pt reports "limited a lot" to "climbing flight of stairs" on FOTO.    Time 6    Period Weeks    Status New    Target Date 01/13/20      PT LONG TERM GOAL #5   Title Patient will be able to tolerate static standing and walking for at least 45 minutes each on level ground without significant increase in pain or requiring seated rest break to allow for improved tolerance to functional activity.    Baseline 15-20 min standing and 10 min walking without BUE support tolerated. It takes pt 6 hours (6-11am) to clean her house 1x/week due to taking frequent breaks because of pain.    Time 6    Period Weeks    Status New    Target Date 01/13/20      Additional Long Term Goals   Additional Long Term Goals Yes      PT LONG TERM GOAL #6   Title Pt's FOTO score will improve from 46% limitation to 36% limitation or better.    Baseline 46% limited (54% function); predicted 36% limitation (64% function)    Time 6    Period Weeks    Status New    Target Date 01/13/20                 Plan - 12/17/19 0940    Clinical Impression Statement Good tolerance to seated and mat activity, though patient limited in progression of standing activity secondary to comorbid Lt knee pain that she rates as 10/10 with standing and walking activity.  She demonstrates improved carry over of proper performance of seated pelvic tilts and her postural awareness has much improved requiring minimal cues to decrease excessive lordosis. She demonstrates full and pain free trunk AROM during today's session. Patient recommended to f/u with physician regarding worsening Lt knee pain as this has now become her primary complaint since initiating PT.    Personal Factors and Comorbidities Comorbidity 1;Comorbidity 2;Comorbidity 3+;Age;Past/Current Experience;Time since onset of injury/illness/exacerbation    Comorbidities See extensive PMH above     Examination-Activity Limitations Bed Mobility;Bend;Carry;Lift;Locomotion Level;Sit;Sleep;Squat;Stairs;Stand;Toileting;Transfers    Examination-Participation Restrictions Church;Cleaning;Community Activity;Driving;Meal Prep;Shop    Stability/Clinical Decision Making Stable/Uncomplicated    Rehab Potential Good    PT Treatment/Interventions ADLs/Self Care Home Management;Aquatic Therapy;Cryotherapy;Electrical Stimulation;Iontophoresis 4mg /ml Dexamethasone;Moist Heat;Neuromuscular re-education;Balance training;Therapeutic exercise;Therapeutic activities;Functional mobility training;Stair training;Gait training;Patient/family education;Manual techniques;Energy conservation;Dry needling;Passive range of motion    PT Next Visit Plan Progress core stabilization and hpi strengthening as tolerated.    PT Home Exercise Plan [id number]    Consulted and Agree with Plan of Care Patient           Patient will benefit from skilled therapeutic intervention in order to improve the following deficits and impairments:  Difficulty walking,Decreased activity tolerance,Decreased balance,Decreased mobility,Decreased strength,Pain,Obesity,Improper body mechanics,Postural dysfunction,Decreased endurance,Decreased range of motion  Visit Diagnosis: Chronic midline low back pain, unspecified whether sciatica present  Difficulty in walking, not elsewhere classified  Muscle weakness (generalized)     Problem List Patient Active Problem List  Diagnosis Date Noted  . Primary osteoarthritis of left knee 04/07/2019  . Body mass index 40.0-44.9, adult ([place]) 04/07/2019  . Morbid obesity ([place]) 04/07/2019  . DJD (degenerative joint disease) of knee 03/24/2019  . Lumbar spondylosis 03/24/2019  . Facet hypertrophy of lumbosacral region 03/24/2019  . Chronic combined systolic (congestive) and diastolic (congestive) heart failure ([place]) 01/07/2019  . Obesity, Class III, BMI 40-49.9 (morbid obesity) ([place]) 01/07/2019  .  Suspected COVID-19 virus infection 01/07/2019  . Acute respiratory failure due to COVID-19 ([place]) 01/07/2019  . Anemia 12/03/2014  . CHF exacerbation ([place]) 12/03/2014  . Hypertension 12/03/2014  . Acute on chronic combined systolic and diastolic heart failure ([place])   . Rotator cuff syndrome of right shoulder 04/26/2012  . S/P TAH-BSO 04/26/2012  . Obesity 04/26/2012  . Chronic systolic heart failure ([place]) 10/17/2010  . Hypertensive heart disease 04/16/2000   Gwendolyn Grant, PT, DPT, ATC 12/17/19 10:13 AM  [facility] [address] [place], [place], [address] Phone: [phone]   Fax:  [phone]  Name: Cynthea Zachman [facility] MRN: [id number] Date of Birth: 02/04/1950

## 2019-12-21 ENCOUNTER — Ambulatory Visit: Payer: Medicare Other

## 2019-12-21 ENCOUNTER — Telehealth: Payer: Self-pay

## 2019-12-21 NOTE — Telephone Encounter (Signed)
PT spoke with patient who states she forgot scheduled appointment today. PT reminded patient of next scheduled appointment on 12/24/19.

## 2019-12-24 ENCOUNTER — Other Ambulatory Visit: Payer: Self-pay

## 2019-12-24 ENCOUNTER — Ambulatory Visit: Payer: Medicare Other

## 2019-12-24 DIAGNOSIS — M6281 Muscle weakness (generalized): Secondary | ICD-10-CM | POA: Diagnosis not present

## 2019-12-24 DIAGNOSIS — R262 Difficulty in walking, not elsewhere classified: Secondary | ICD-10-CM | POA: Diagnosis not present

## 2019-12-24 DIAGNOSIS — G8929 Other chronic pain: Secondary | ICD-10-CM | POA: Diagnosis not present

## 2019-12-24 DIAGNOSIS — M545 Low back pain, unspecified: Secondary | ICD-10-CM | POA: Diagnosis not present

## 2019-12-24 NOTE — Therapy (Signed)
[facility] [address] [place], [place], [address] Phone: [phone]   Fax:  [phone]  Physical Therapy Treatment  Patient Details  Name: Deborah Holmes MRN: [id number] Date of Birth: 1950/10/20 Referring Provider (PT): Barbette Merino, NP   Encounter Date: 12/24/2019   PT End of Session - 12/24/19 0912    Visit Number 5    Number of Visits 13    Date for PT Re-Evaluation 01/16/20    Authorization Type [employer] - [place] at visit 6 and visit 10; PN visit 10; KX modifier visit 15    PT Start Time 0920    PT Stop Time 1002    PT Time Calculation (min) 42 min    Activity Tolerance Patient tolerated treatment well;Patient limited by pain    Behavior During Therapy [facility] for tasks assessed/performed           Past Medical History:  Diagnosis Date  . CHF (congestive heart failure) ([facility])   . Hypertension     Past Surgical History:  Procedure Laterality Date  . COLONOSCOPY    . ROTATOR CUFF REPAIR      There were no vitals filed for this visit.   Subjective Assessment - 12/24/19 0922    Subjective She reports her back is feeling better, but her Lt knee is continuing to bother her. She reports compliance with her HEP.    Pertinent History see extensive PMH above. L knee osteoarthritis    Limitations Sitting;Walking;Standing;House hold activities    How long can you sit comfortably? "About 10 minutes. My couch is low so I have to sit on 2 pillows and rock to get up"    How long can you stand comfortably? "I was able to clean yesterday on my feet for about an hour."    How long can you walk comfortably? "If I'm pushing something, I'm alright" referring to shopping cart at grocery store. About 10 min if without BUE support    Diagnostic tests Imaging lumbar spine 11/09/2019: multilevel degenerative change without acute abnormality. L knee 03/23/2019: Progressive 3 compartmental left knee osteoarthritis,  most severe in the lateral and patellofemoral compartments.    Patient Stated Goals "My exercises. I'm very active. Clean up the yard. Getting out of the car." Pt was doing squats previously at a senior center and aerobic exercise that has been limited due to pain.    Currently in Pain? Yes    Pain Score 5     Pain Location Back    Pain Orientation Lower    Pain Descriptors / Indicators --   annoying   Pain Type Chronic pain    Pain Onset More than a month ago    Pain Frequency Intermittent    Multiple Pain Sites Yes    Pain Score 10    Pain Location Knee    Pain Orientation Left;Anterior    Pain Descriptors / Indicators Sharp    Pain Type Chronic pain    Pain Onset More than a month ago    Pain Frequency Constant                             [facility] Adult PT Treatment/Exercise - 12/24/19 0001      Ambulation/Gait   Ambulation/Gait Yes    Ambulation/Gait Assistance 6: Modified independent (Device/Increase time)    Ambulation Distance (Feet) 15 Feet    Assistive device Straight cane  Lumbar Exercises: Seated   LAQ on Chair Limitations 2 x 10; 2lbs    Other Seated Lumbar Exercises stability ball rollout 1 min      Lumbar Exercises: Supine   Pelvic Tilt Limitations 2 x 10 PPT    Bent Knee Raise Limitations 2 x 10      Lumbar Exercises: Sidelying   Hip Abduction Limitations 2 x 10      Manual Therapy   Manual therapy comments STM to bilateral lumbar paraspinals, QL, glute max                  PT Education - 12/24/19 0950    Education Details Education on proper fitting of SPC. Recommendation to f/u with physician regarding ongoing severe Lt knee pain    Person(s) Educated Patient    Methods Explanation;Demonstration;Verbal cues    Comprehension Verbalized understanding;Returned demonstration            PT Short Term Goals - 12/02/19 1320      PT SHORT TERM GOAL #1   Title Patient will be independent with initial HEP.    Baseline Pt  provided with initial HEP during evaluation 12/02/2019.    Time 3    Period Weeks    Status New    Target Date 12/23/19      PT SHORT TERM GOAL #2   Title Patient will be able to tolerate sitting for at least 20 minutes without having to readjust due to increased symptoms/low back pain.    Baseline Pt reports being able to sit for 10 minutes before having to readjust due to LBP.    Time 3    Period Weeks    Status New    Target Date 12/23/19      PT SHORT TERM GOAL #3   Title Patient will present with at least 4/5 R hip FL MMT without low back pain.    Baseline 3+/5 and pain reported in low back with resistance    Time 3    Period Weeks    Status New    Target Date 12/23/19      PT SHORT TERM GOAL #4   Title Patient will be able to ascend/descend 3 6-inch steps without BUE safely without LBP to allow for safe stair negotiation to enter/exit home.    Baseline Pt is able to negotiate stairs at home and church but has difficulty. 3 steps at home do not have handrails. Pt reports "limited a lot" to "climbing flight of stairs" on FOTO.    Time 3    Period Weeks    Status New    Target Date 12/23/19             PT Long Term Goals - 12/02/19 1340      PT LONG TERM GOAL #1   Title Patient will be independent with advanced HEP.    Baseline Pt provided with initial HEP during evaluation 12/02/2019.    Time 6    Period Weeks    Status New    Target Date 01/13/20      PT LONG TERM GOAL #2   Title Patient will be able to tolerate sitting for at least 45 minutes without having to readjust due to increased symptoms/low back pain.    Baseline Pt reports being able to sit for 10 minutes before having to readjust due to LBP.    Time 6    Period Weeks    Status New  Target Date 01/13/20      PT LONG TERM GOAL #3   Title Patient will present with at least 4+/5 R hip FL MMT without low back pain.    Baseline 3+/5 and pain reported in low back with resistance    Time 6    Period  Weeks    Status New    Target Date 01/13/20      PT LONG TERM GOAL #4   Title Patient will be able to ascend/descend 12 6-inch steps with BUE as needed safely without LBP to allow for safe stair negotiation to enter/exit church each Sunday.    Baseline Pt is able to negotiate stairs at home and church (unclear if HR present at church) but has difficulty. Pt reports "limited a lot" to "climbing flight of stairs" on FOTO.    Time 6    Period Weeks    Status New    Target Date 01/13/20      PT LONG TERM GOAL #5   Title Patient will be able to tolerate static standing and walking for at least 45 minutes each on level ground without significant increase in pain or requiring seated rest break to allow for improved tolerance to functional activity.    Baseline 15-20 min standing and 10 min walking without BUE support tolerated. It takes pt 6 hours (6-11am) to clean her house 1x/week due to taking frequent breaks because of pain.    Time 6    Period Weeks    Status New    Target Date 01/13/20      Additional Long Term Goals   Additional Long Term Goals Yes      PT LONG TERM GOAL #6   Title Pt's FOTO score will improve from 46% limitation to 36% limitation or better.    Baseline 46% limited (54% function); predicted 36% limitation (64% function)    Time 6    Period Weeks    Status New    Target Date 01/13/20                 Plan - 12/24/19 [id number]    Clinical Impression Statement Patient is progressing well in regards to her low back pain, however her complaints of Lt knee pain have gradually worsened over the course of initiating PT for her low back pain. She is tolerating NWB activity well, though remains limited in progression to standing activity secondary to co-morbid Lt knee pain. Patient demonstrates improved gait pattern with SPC compared to use of quad cane at previous sessions as she is able to demonstrate appropriate weight shift bilaterally and step through gait pattern. She  requires continued focus on improving core stability and hip strength, though is strongly recommended to follow-up with physician  in regards to significant Lt knee pain.    Personal Factors and Comorbidities Comorbidity 1;Comorbidity 2;Comorbidity 3+;Age;Past/Current Experience;Time since onset of injury/illness/exacerbation    Comorbidities See extensive PMH above    Examination-Activity Limitations Bed Mobility;Bend;Carry;Lift;Locomotion Level;Sit;Sleep;Squat;Stairs;Stand;Toileting;Transfers    Examination-Participation Restrictions Church;Cleaning;Community Activity;Driving;Meal Prep;Shop    Stability/Clinical Decision Making Stable/Uncomplicated    Rehab Potential Good    PT Treatment/Interventions ADLs/Self Care Home Management;Aquatic Therapy;Cryotherapy;Electrical Stimulation;Iontophoresis 4mg /ml Dexamethasone;Moist Heat;Neuromuscular re-education;Balance training;Therapeutic exercise;Therapeutic activities;Functional mobility training;Stair training;Gait training;Patient/family education;Manual techniques;Energy conservation;Dry needling;Passive range of motion    PT Next Visit Plan Progress core stabilization and hip strengthening as tolerated. Lt knee pain limits progression to standing activity.    PT Home Exercise Plan [id number]    Recommended Other Services f/u  with physician in regards to Lt knee pain    Consulted and Agree with Plan of Care Patient           Patient will benefit from skilled therapeutic intervention in order to improve the following deficits and impairments:  Difficulty walking,Decreased activity tolerance,Decreased balance,Decreased mobility,Decreased strength,Pain,Obesity,Improper body mechanics,Postural dysfunction,Decreased endurance,Decreased range of motion  Visit Diagnosis: Chronic midline low back pain, unspecified whether sciatica present  Difficulty in walking, not elsewhere classified  Muscle weakness (generalized)     Problem List Patient  Active Problem List   Diagnosis Date Noted  . Primary osteoarthritis of left knee 04/07/2019  . Body mass index 40.0-44.9, adult ([place]) 04/07/2019  . Morbid obesity ([place]) 04/07/2019  . DJD (degenerative joint disease) of knee 03/24/2019  . Lumbar spondylosis 03/24/2019  . Facet hypertrophy of lumbosacral region 03/24/2019  . Chronic combined systolic (congestive) and diastolic (congestive) heart failure ([place]) 01/07/2019  . Obesity, Class III, BMI 40-49.9 (morbid obesity) ([place]) 01/07/2019  . Suspected COVID-19 virus infection 01/07/2019  . Acute respiratory failure due to COVID-19 ([place]) 01/07/2019  . Anemia 12/03/2014  . CHF exacerbation ([place]) 12/03/2014  . Hypertension 12/03/2014  . Acute on chronic combined systolic and diastolic heart failure ([place])   . Rotator cuff syndrome of right shoulder 04/26/2012  . S/P TAH-BSO 04/26/2012  . Obesity 04/26/2012  . Chronic systolic heart failure ([place]) 10/17/2010  . Hypertensive heart disease 04/16/2000   Gwendolyn Grant, PT, DPT, ATC 12/24/19 10:12 AM  [facility] [address] [place], [place], [address] Phone: [phone]   Fax:  [phone]  Name: Deborah Holmes [facility] MRN: [id number] Date of Birth: 31-Dec-1950

## 2019-12-28 ENCOUNTER — Other Ambulatory Visit: Payer: Self-pay

## 2019-12-28 ENCOUNTER — Ambulatory Visit: Payer: Medicare Other

## 2019-12-28 DIAGNOSIS — M545 Low back pain, unspecified: Secondary | ICD-10-CM | POA: Diagnosis not present

## 2019-12-28 DIAGNOSIS — G8929 Other chronic pain: Secondary | ICD-10-CM | POA: Diagnosis not present

## 2019-12-28 DIAGNOSIS — M6281 Muscle weakness (generalized): Secondary | ICD-10-CM | POA: Diagnosis not present

## 2019-12-28 DIAGNOSIS — R262 Difficulty in walking, not elsewhere classified: Secondary | ICD-10-CM | POA: Diagnosis not present

## 2019-12-28 NOTE — Therapy (Signed)
[place], [place], [address] Phone: [phone]   Fax:  [phone]  Physical Therapy Treatment  Patient Details  Name: Deborah Holmes MRN: [id number] Date of Birth: Jul 07, 1950 Referring Provider (PT): [name], NP   Encounter Date: 12/28/2019   PT End of Session - 12/28/19 0954    Visit Number 6    Number of Visits 13    Date for PT Re-Evaluation 01/16/20    Authorization Type [place] at visit 6 and visit 10; PN visit 10; KX modifier visit 15    PT Start Time 0931    PT Stop Time 1012    PT Time Calculation (min) 41 min    Activity Tolerance Patient tolerated treatment well    Behavior During Therapy [facility] for tasks assessed/performed           Past Medical History:  Diagnosis Date  . CHF (congestive heart failure) ([place])   . Hypertension     Past Surgical History:  Procedure Laterality Date  . COLONOSCOPY    . ROTATOR CUFF REPAIR      There were no vitals filed for this visit.   Subjective Assessment - 12/28/19 0933    Subjective She reports her back feels ok with minimal pain currently. She was able to cook all day on Christmas and her back pain "done good." She reports her knee is feeling a little better, but still bothers her when walking.    Pertinent History see extensive PMH above. L knee osteoarthritis    Limitations Sitting;Walking;Standing;House hold activities    How long can you stand comfortably? "I was able to cook all day on Christmas, maybe an hour"    Diagnostic tests Imaging lumbar spine 11/09/2019: multilevel degenerative change without acute abnormality. L knee 03/23/2019: Progressive 3 compartmental left knee osteoarthritis, most severe in the lateral and patellofemoral compartments.    Patient Stated Goals "My exercises. I'm very active. Clean up the yard. Getting out of the car." Pt was doing squats previously at a senior center and  aerobic exercise that has been limited due to pain.    Currently in Pain? Yes    Pain Score 2     Pain Location Back    Pain Orientation Lower    Pain Descriptors / Indicators Aching    Pain Type Chronic pain    Pain Onset More than a month ago    Pain Frequency Intermittent    Multiple Pain Sites Yes    Pain Score 3    Pain Location Knee    Pain Orientation Left;Medial;Lateral    Pain Descriptors / Indicators Aching    Pain Type Chronic pain    Pain Onset More than a month ago    Pain Frequency Constant              [facility] PT Assessment - 12/28/19 0001      Observation/Other Assessments   Focus on Therapeutic Outcomes (FOTO)  60% function                         [facility] Adult PT Treatment/Exercise - 12/28/19 0001      Self-Care   Other Self-Care Comments  See patient education      Lumbar Exercises: Standing   Other Standing Lumbar Exercises sit to stand raised table height 2 x 10 (attempted from standard chair height)      Lumbar Exercises: Seated  Other Seated Lumbar Exercises seated march 2  x10    Other Seated Lumbar Exercises pelvic tilts 2 x 10      Lumbar Exercises: Supine   Other Supine Lumbar Exercises LTR 2 min      Manual Therapy   Manual therapy comments STM to bilateral lumbar paraspinals, posterior gluteal musculature                  PT Education - 12/28/19 1006    Education Details education FOTO score and overall function progress to date. Postural education.    Person(s) Educated Patient    Methods Explanation;Demonstration    Comprehension Verbalized understanding;Returned demonstration            PT Short Term Goals - 12/02/19 1320      PT SHORT TERM GOAL #1   Title Patient will be independent with initial HEP.    Baseline Pt provided with initial HEP during evaluation 12/02/2019.    Time 3    Period Weeks    Status New    Target Date 12/23/19      PT SHORT TERM GOAL #2   Title Patient will be able to  tolerate sitting for at least 20 minutes without having to readjust due to increased symptoms/low back pain.    Baseline Pt reports being able to sit for 10 minutes before having to readjust due to LBP.    Time 3    Period Weeks    Status New    Target Date 12/23/19      PT SHORT TERM GOAL #3   Title Patient will present with at least 4/5 R hip FL MMT without low back pain.    Baseline 3+/5 and pain reported in low back with resistance    Time 3    Period Weeks    Status New    Target Date 12/23/19      PT SHORT TERM GOAL #4   Title Patient will be able to ascend/descend 3 6-inch steps without BUE safely without LBP to allow for safe stair negotiation to enter/exit home.    Baseline Pt is able to negotiate stairs at home and church but has difficulty. 3 steps at home do not have handrails. Pt reports "limited a lot" to "climbing flight of stairs" on FOTO.    Time 3    Period Weeks    Status New    Target Date 12/23/19             PT Long Term Goals - 12/02/19 1340      PT LONG TERM GOAL #1   Title Patient will be independent with advanced HEP.    Baseline Pt provided with initial HEP during evaluation 12/02/2019.    Time 6    Period Weeks    Status New    Target Date 01/13/20      PT LONG TERM GOAL #2   Title Patient will be able to tolerate sitting for at least 45 minutes without having to readjust due to increased symptoms/low back pain.    Baseline Pt reports being able to sit for 10 minutes before having to readjust due to LBP.    Time 6    Period Weeks    Status New    Target Date 01/13/20      PT LONG TERM GOAL #3   Title Patient will present with at least 4+/5 R hip FL MMT without low back pain.    Baseline 3+/5 and  pain reported in low back with resistance    Time 6    Period Weeks    Status New    Target Date 01/13/20      PT LONG TERM GOAL #4   Title Patient will be able to ascend/descend 12 6-inch steps with BUE as needed safely without LBP to allow  for safe stair negotiation to enter/exit church each Sunday.    Baseline Pt is able to negotiate stairs at home and church (unclear if HR present at church) but has difficulty. Pt reports "limited a lot" to "climbing flight of stairs" on FOTO.    Time 6    Period Weeks    Status New    Target Date 01/13/20      PT LONG TERM GOAL #5   Title Patient will be able to tolerate static standing and walking for at least 45 minutes each on level ground without significant increase in pain or requiring seated rest break to allow for improved tolerance to functional activity.    Baseline 15-20 min standing and 10 min walking without BUE support tolerated. It takes pt 6 hours (6-11am) to clean her house 1x/week due to taking frequent breaks because of pain.    Time 6    Period Weeks    Status New    Target Date 01/13/20      Additional Long Term Goals   Additional Long Term Goals Yes      PT LONG TERM GOAL #6   Title Pt's FOTO score will improve from 46% limitation to 36% limitation or better.    Baseline 46% limited (54% function); predicted 36% limitation (64% function)    Time 6    Period Weeks    Status New    Target Date 01/13/20                 Plan - 12/28/19 1008    Clinical Impression Statement Patient tolerated session well today with exception of seated pelvic tilts as patient reports minor increase in low back pain during this exercise that subsides once exercise is complete. Able to introduce standing activity with patient having difficulty controlling descent of sit <> stand transfer from chair height, though from elevated height patient demonstrates improved eccentric control. Patient denied any back pain at conclusion of session and reports her Lt knee pain "isn't too bad."    Personal Factors and Comorbidities Comorbidity 1;Comorbidity 2;Comorbidity 3+;Age;Past/Current Experience;Time since onset of injury/illness/exacerbation    Comorbidities See extensive PMH above     Examination-Activity Limitations Bed Mobility;Bend;Carry;Lift;Locomotion Level;Sit;Sleep;Squat;Stairs;Stand;Toileting;Transfers    Examination-Participation Restrictions Church;Cleaning;Community Activity;Driving;Meal Prep;Shop    Stability/Clinical Decision Making Stable/Uncomplicated    Rehab Potential Good    PT Treatment/Interventions ADLs/Self Care Home Management;Aquatic Therapy;Cryotherapy;Electrical Stimulation;Iontophoresis 4mg /ml Dexamethasone;Moist Heat;Neuromuscular re-education;Balance training;Therapeutic exercise;Therapeutic activities;Functional mobility training;Stair training;Gait training;Patient/family education;Manual techniques;Energy conservation;Dry needling;Passive range of motion    PT Next Visit Plan Progress standing activity as tolerated. Update HEP. Consider NuStep.    PT Home Exercise Plan [email or web address]    Recommended Other Services f/u with physician in regards to Lt knee pain    Consulted and Agree with Plan of Care Patient           Patient will benefit from skilled therapeutic intervention in order to improve the following deficits and impairments:  Difficulty walking,Decreased activity tolerance,Decreased balance,Decreased mobility,Decreased strength,Pain,Obesity,Improper body mechanics,Postural dysfunction,Decreased endurance,Decreased range of motion  Visit Diagnosis: Chronic midline low back pain, unspecified whether sciatica present  Difficulty in walking, not elsewhere classified  Muscle weakness (generalized)     Problem List Patient Active Problem List   Diagnosis Date Noted  . Primary osteoarthritis of left knee 04/07/2019  . Body mass index 40.0-44.9, adult ([place]) 04/07/2019  . Morbid obesity ([place]) 04/07/2019  . DJD (degenerative joint disease) of knee 03/24/2019  . Lumbar spondylosis 03/24/2019  . Facet hypertrophy of lumbosacral region 03/24/2019  . Chronic combined systolic (congestive) and diastolic (congestive) heart failure ([place])  01/07/2019  . Obesity, Class III, BMI 40-49.9 (morbid obesity) ([place]) 01/07/2019  . Suspected COVID-19 virus infection 01/07/2019  . Acute respiratory failure due to COVID-19 ([place]) 01/07/2019  . Anemia 12/03/2014  . CHF exacerbation ([place]) 12/03/2014  . Hypertension 12/03/2014  . Acute on chronic combined systolic and diastolic heart failure ([place])   . Rotator cuff syndrome of right shoulder 04/26/2012  . S/P TAH-BSO 04/26/2012  . Obesity 04/26/2012  . Chronic systolic heart failure ([place]) 10/17/2010  . Hypertensive heart disease 04/16/2000   Gwendolyn Grant, PT, DPT, ATC 12/28/19 12:51 PM [facility] [address] [place], [place], [address] Phone: [phone]   Fax:  [phone]  Name: Deborah Holmes [facility] MRN: [id number] Date of Birth: 12-16-1950

## 2019-12-30 ENCOUNTER — Other Ambulatory Visit: Payer: Self-pay

## 2019-12-30 ENCOUNTER — Ambulatory Visit
Admission: RE | Admit: 2019-12-30 | Discharge: 2019-12-30 | Disposition: A | Discharge status: Home / Self Care | Payer: Medicare Other | Source: Ambulatory Visit | Attending: Nurse Practitioner | Admitting: Nurse Practitioner

## 2019-12-30 DIAGNOSIS — Z1231 Encounter for screening mammogram for malignant neoplasm of breast: Secondary | ICD-10-CM | POA: Diagnosis not present

## 2019-12-31 ENCOUNTER — Ambulatory Visit: Payer: Medicare Other

## 2019-12-31 DIAGNOSIS — R262 Difficulty in walking, not elsewhere classified: Secondary | ICD-10-CM

## 2019-12-31 DIAGNOSIS — M6281 Muscle weakness (generalized): Secondary | ICD-10-CM | POA: Diagnosis not present

## 2019-12-31 DIAGNOSIS — M545 Low back pain, unspecified: Secondary | ICD-10-CM

## 2019-12-31 DIAGNOSIS — G8929 Other chronic pain: Secondary | ICD-10-CM

## 2019-12-31 NOTE — Therapy (Signed)
[place], [place], [address] Phone: [phone]   Fax:  [phone]  Physical Therapy Treatment  Patient Details  Name: Deborah Holmes MRN: [id number] Date of Birth: 1950-09-07 Referring Provider (PT): [name], NP   Encounter Date: 12/31/2019   PT End of Session - 12/31/19 0944    Visit Number 7    Number of Visits 13    Date for PT Re-Evaluation 01/16/20    Authorization Type [place] at visit 6 and visit 10; PN visit 10; KX modifier visit 15    PT Start Time 0930    PT Stop Time 1013    PT Time Calculation (min) 43 min    Activity Tolerance Patient tolerated treatment well    Behavior During Therapy [facility] for tasks assessed/performed           Past Medical History:  Diagnosis Date  . CHF (congestive heart failure) ([place])   . Hypertension     Past Surgical History:  Procedure Laterality Date  . COLONOSCOPY    . ROTATOR CUFF REPAIR      There were no vitals filed for this visit.   Subjective Assessment - 12/31/19 0932    Subjective She reports mild pain across low back and Rt posterior hip. She felt good after last session reporting no pain. She reports the Lt knee is feeling better and she hasn't been walking with her cane as much. She reports compliance with HEP.    Pertinent History see extensive PMH above. L knee osteoarthritis    Currently in Pain? Yes    Pain Score 5     Pain Location Back    Pain Orientation Lower;Right    Pain Type Chronic pain    Pain Onset More than a month ago    Pain Frequency Intermittent    Multiple Pain Sites Yes    Pain Score 3    Pain Location Knee    Pain Orientation Left;Medial    Pain Descriptors / Indicators Aching    Pain Type Chronic pain    Pain Onset More than a month ago    Pain Frequency Constant                             [facility] Adult PT Treatment/Exercise - 12/31/19 0001       Lumbar Exercises: Stretches   Other Lumbar Stretch Exercise LTR 2 min      Lumbar Exercises: Seated   Other Seated Lumbar Exercises trunk flexion 1 x 10      Lumbar Exercises: Supine   Bridge Limitations 2 x 10 partial range      Knee/Hip Exercises: Standing   Heel Raises Limitations 2 x 10    Hip Flexion Limitations march 2 x 10    Abduction Limitations 2 x 10    Extension Limitations 2 x 10    Other Standing Knee Exercises semi tandem 2 x 30 sec each      Manual Therapy   Manual therapy comments STM to Rt lumbar parapsinals and posterior hip musculature                  PT Education - 12/31/19 1014    Education Details Education on updated HEP.    Person(s) Educated Patient    Methods Explanation;Handout    Comprehension Verbalized understanding;Returned demonstration  PT Short Term Goals - 12/02/19 1320      PT SHORT TERM GOAL #1   Title Patient will be independent with initial HEP.    Baseline Pt provided with initial HEP during evaluation 12/02/2019.    Time 3    Period Weeks    Status New    Target Date 12/23/19      PT SHORT TERM GOAL #2   Title Patient will be able to tolerate sitting for at least 20 minutes without having to readjust due to increased symptoms/low back pain.    Baseline Pt reports being able to sit for 10 minutes before having to readjust due to LBP.    Time 3    Period Weeks    Status New    Target Date 12/23/19      PT SHORT TERM GOAL #3   Title Patient will present with at least 4/5 R hip FL MMT without low back pain.    Baseline 3+/5 and pain reported in low back with resistance    Time 3    Period Weeks    Status New    Target Date 12/23/19      PT SHORT TERM GOAL #4   Title Patient will be able to ascend/descend 3 6-inch steps without BUE safely without LBP to allow for safe stair negotiation to enter/exit home.    Baseline Pt is able to negotiate stairs at home and church but has difficulty. 3 steps at home do  not have handrails. Pt reports "limited a lot" to "climbing flight of stairs" on FOTO.    Time 3    Period Weeks    Status New    Target Date 12/23/19             PT Long Term Goals - 12/02/19 1340      PT LONG TERM GOAL #1   Title Patient will be independent with advanced HEP.    Baseline Pt provided with initial HEP during evaluation 12/02/2019.    Time 6    Period Weeks    Status New    Target Date 01/13/20      PT LONG TERM GOAL #2   Title Patient will be able to tolerate sitting for at least 45 minutes without having to readjust due to increased symptoms/low back pain.    Baseline Pt reports being able to sit for 10 minutes before having to readjust due to LBP.    Time 6    Period Weeks    Status New    Target Date 01/13/20      PT LONG TERM GOAL #3   Title Patient will present with at least 4+/5 R hip FL MMT without low back pain.    Baseline 3+/5 and pain reported in low back with resistance    Time 6    Period Weeks    Status New    Target Date 01/13/20      PT LONG TERM GOAL #4   Title Patient will be able to ascend/descend 12 6-inch steps with BUE as needed safely without LBP to allow for safe stair negotiation to enter/exit church each Sunday.    Baseline Pt is able to negotiate stairs at home and church (unclear if HR present at church) but has difficulty. Pt reports "limited a lot" to "climbing flight of stairs" on FOTO.    Time 6    Period Weeks    Status New    Target Date 01/13/20  PT LONG TERM GOAL #5   Title Patient will be able to tolerate static standing and walking for at least 45 minutes each on level ground without significant increase in pain or requiring seated rest break to allow for improved tolerance to functional activity.    Baseline 15-20 min standing and 10 min walking without BUE support tolerated. It takes pt 6 hours (6-11am) to clean her house 1x/week due to taking frequent breaks because of pain.    Time 6    Period Weeks     Status New    Target Date 01/13/20      Additional Long Term Goals   Additional Long Term Goals Yes      PT LONG TERM GOAL #6   Title Pt's FOTO score will improve from 46% limitation to 36% limitation or better.    Baseline 46% limited (54% function); predicted 36% limitation (64% function)    Time 6    Period Weeks    Status New    Target Date 01/13/20                 Plan - 12/31/19 0953    Clinical Impression Statement Able to progress standing activity as patient's Lt knee pain has much reduced compared to previous sessions. Patient requires heavy cues to decrease lateral trunk lean/pelvic drop during SL activity on the LLE with moderate ability to correct once cued. Introduction to static balance activity with patient demonstrating fair postural control. Patient reported onset of low back pain within about 5 minutes of standing activity that was abolished with repeated trunk flexion in sitting.    Personal Factors and Comorbidities Comorbidity 1;Comorbidity 2;Comorbidity 3+;Age;Past/Current Experience;Time since onset of injury/illness/exacerbation    Comorbidities See extensive PMH above    Examination-Activity Limitations Bed Mobility;Bend;Carry;Lift;Locomotion Level;Sit;Sleep;Squat;Stairs;Stand;Toileting;Transfers    Examination-Participation Restrictions Church;Cleaning;Community Activity;Driving;Meal Prep;Shop    Stability/Clinical Decision Making Stable/Uncomplicated    Rehab Potential Good    PT Treatment/Interventions ADLs/Self Care Home Management;Aquatic Therapy;Cryotherapy;Electrical Stimulation;Iontophoresis 4mg /ml Dexamethasone;Moist Heat;Neuromuscular re-education;Balance training;Therapeutic exercise;Therapeutic activities;Functional mobility training;Stair training;Gait training;Patient/family education;Manual techniques;Energy conservation;Dry needling;Passive range of motion    PT Next Visit Plan Progress standing activity as tolerated. Consider NuStep.    PT  Home Exercise Plan [id number]    Consulted and Agree with Plan of Care Patient           Patient will benefit from skilled therapeutic intervention in order to improve the following deficits and impairments:  Difficulty walking,Decreased activity tolerance,Decreased balance,Decreased mobility,Decreased strength,Pain,Obesity,Improper body mechanics,Postural dysfunction,Decreased endurance,Decreased range of motion  Visit Diagnosis: Chronic midline low back pain, unspecified whether sciatica present  Difficulty in walking, not elsewhere classified  Muscle weakness (generalized)     Problem List Patient Active Problem List   Diagnosis Date Noted  . Primary osteoarthritis of left knee 04/07/2019  . Body mass index 40.0-44.9, adult ([facility]) 04/07/2019  . Morbid obesity ([facility]) 04/07/2019  . DJD (degenerative joint disease) of knee 03/24/2019  . Lumbar spondylosis 03/24/2019  . Facet hypertrophy of lumbosacral region 03/24/2019  . Chronic combined systolic (congestive) and diastolic (congestive) heart failure ([facility]) 01/07/2019  . Obesity, Class III, BMI 40-49.9 (morbid obesity) ([facility]) 01/07/2019  . Suspected COVID-19 virus infection 01/07/2019  . Acute respiratory failure due to COVID-19 ([facility]) 01/07/2019  . Anemia 12/03/2014  . CHF exacerbation ([facility]) 12/03/2014  . Hypertension 12/03/2014  . Acute on chronic combined systolic and diastolic heart failure ([facility])   . Rotator cuff syndrome of right shoulder 04/26/2012  . S/P TAH-BSO 04/26/2012  .  Obesity 04/26/2012  . Chronic systolic heart failure ([place]) 10/17/2010  . Hypertensive heart disease 04/16/2000    Edwin Cap 12/31/2019, 10:15 AM  [facility] [address] [place], [place], [address] Phone: [phone]   Fax:  [phone]  Name: Trania Lehl [facility] MRN: [id number] Date of Birth: 16-Feb-1950

## 2020-01-05 ENCOUNTER — Ambulatory Visit: Payer: Medicare Other | Attending: Nurse Practitioner

## 2020-01-05 ENCOUNTER — Other Ambulatory Visit: Payer: Self-pay

## 2020-01-05 DIAGNOSIS — M6281 Muscle weakness (generalized): Secondary | ICD-10-CM | POA: Insufficient documentation

## 2020-01-05 DIAGNOSIS — M545 Low back pain, unspecified: Secondary | ICD-10-CM | POA: Diagnosis not present

## 2020-01-05 DIAGNOSIS — G8929 Other chronic pain: Secondary | ICD-10-CM

## 2020-01-05 DIAGNOSIS — R262 Difficulty in walking, not elsewhere classified: Secondary | ICD-10-CM

## 2020-01-05 NOTE — Therapy (Signed)
[place], [place], [address] Phone: [phone]   Fax:  [phone]  Physical Therapy Treatment  Patient Details  Name: Deborah Holmes MRN: [id number] Date of Birth: November 24, 1950 Referring Provider (PT): [name], NP   Encounter Date: 01/05/2020   PT End of Session - 01/05/20 0923    Visit Number 8    Number of Visits 13    Date for PT Re-Evaluation 01/16/20    Authorization Type [place] at visit 6 and visit 10; PN visit 10; KX modifier visit 15    PT Start Time [id number]    PT Stop Time 1011    PT Time Calculation (min) 45 min    Activity Tolerance Patient tolerated treatment well    Behavior During Therapy [facility] for tasks assessed/performed           Past Medical History:  Diagnosis Date  . CHF (congestive heart failure) ([place])   . Hypertension     Past Surgical History:  Procedure Laterality Date  . COLONOSCOPY    . ROTATOR CUFF REPAIR      There were no vitals filed for this visit.   Subjective Assessment - 01/05/20 0929    Subjective Patient reports her low back "just hurts to her butt bone." She reports she felt "good" after last session. She reports compliance with HEP and it helps a little bit with her pain. She can't think of what activities cause her intermittent back pain at this point.    Pertinent History see extensive PMH above. L knee osteoarthritis    How long can you sit comfortably? "All day on my couch"    How long can you stand comfortably? About an hour    How long can you walk comfortably? Well I can walk, but I have to push something like a grocery cart.    Currently in Pain? Yes    Pain Score 4     Pain Location Back    Pain Orientation Right;Lower    Pain Descriptors / Indicators Aching    Pain Type Chronic pain    Pain Onset More than a month ago    Pain Frequency Intermittent    Multiple Pain Sites No                              [facility] Adult PT Treatment/Exercise - 01/05/20 0001      Ambulation/Gait   Number of Stairs 6    Gait Comments step to pattern; circumduction swing on LLE; BUE support      Lumbar Exercises: Aerobic   Nustep 5 minutes LE only level 4      Lumbar Exercises: Standing   Heel Raises Limitations 2 x 10    Other Standing Lumbar Exercises step taps 8 inch step 2 x 10      Lumbar Exercises: Supine   Bridge Limitations 2 x 10 partial range    Other Supine Lumbar Exercises LTR 2 min      Knee/Hip Exercises: Standing   Abduction Limitations 2 x 10    Extension Limitations 2 x 10                    PT Short Term Goals - 01/05/20 0935      PT SHORT TERM GOAL #1   Title Patient will be independent with initial HEP.    Baseline Pt  provided with initial HEP during evaluation 12/02/2019.    Time 3    Period Weeks    Status Achieved    Target Date 12/23/19      PT SHORT TERM GOAL #2   Title Patient will be able to tolerate sitting for at least 20 minutes without having to readjust due to increased symptoms/low back pain.    Baseline Pt reports being able to sit for 10 minutes before having to readjust due to LBP.    Time 3    Period Weeks    Status Achieved    Target Date 12/23/19      PT SHORT TERM GOAL #3   Title Patient will present with at least 4/5 R hip FL MMT without low back pain.    Baseline 3+/5 and pain reported in low back with resistance    Time 3    Period Weeks    Status Deferred    Target Date 12/23/19      PT SHORT TERM GOAL #4   Title Patient will be able to ascend/descend 3 6-inch steps without BUE safely without LBP to allow for safe stair negotiation to enter/exit home.    Baseline Pt is able to negotiate stairs at home and church but has difficulty. 3 steps at home do not have handrails. Pt reports "limited a lot" to "climbing flight of stairs" on FOTO.    Time 3    Period Weeks    Status On-going    Target Date  12/23/19             PT Long Term Goals - 12/02/19 1340      PT LONG TERM GOAL #1   Title Patient will be independent with advanced HEP.    Baseline Pt provided with initial HEP during evaluation 12/02/2019.    Time 6    Period Weeks    Status New    Target Date 01/13/20      PT LONG TERM GOAL #2   Title Patient will be able to tolerate sitting for at least 45 minutes without having to readjust due to increased symptoms/low back pain.    Baseline Pt reports being able to sit for 10 minutes before having to readjust due to LBP.    Time 6    Period Weeks    Status New    Target Date 01/13/20      PT LONG TERM GOAL #3   Title Patient will present with at least 4+/5 R hip FL MMT without low back pain.    Baseline 3+/5 and pain reported in low back with resistance    Time 6    Period Weeks    Status New    Target Date 01/13/20      PT LONG TERM GOAL #4   Title Patient will be able to ascend/descend 12 6-inch steps with BUE as needed safely without LBP to allow for safe stair negotiation to enter/exit church each Sunday.    Baseline Pt is able to negotiate stairs at home and church (unclear if HR present at church) but has difficulty. Pt reports "limited a lot" to "climbing flight of stairs" on FOTO.    Time 6    Period Weeks    Status New    Target Date 01/13/20      PT LONG TERM GOAL #5   Title Patient will be able to tolerate static standing and walking for at least 45 minutes each on level ground without  significant increase in pain or requiring seated rest break to allow for improved tolerance to functional activity.    Baseline 15-20 min standing and 10 min walking without BUE support tolerated. It takes pt 6 hours (6-11am) to clean her house 1x/week due to taking frequent breaks because of pain.    Time 6    Period Weeks    Status New    Target Date 01/13/20      Additional Long Term Goals   Additional Long Term Goals Yes      PT LONG TERM GOAL #6   Title Pt's  FOTO score will improve from 46% limitation to 36% limitation or better.    Baseline 46% limited (54% function); predicted 36% limitation (64% function)    Time 6    Period Weeks    Status New    Target Date 01/13/20                 Plan - 01/05/20 0949    Clinical Impression Statement Patient continues to functionally progress well having achieved 2 short term goals and progressing well towards other estbalished functional goals. She is tolerating standing exercises well without reports of low back pain. Patient able to demo stair negotiation utilizing step to gait pattern and handrail support without reports of back pain and self-reports ability to ascend/descend stairs at church without low back pain. Anticipate discharge soon pending overall tolerance to progression of functional activity and independence with advanced home program.    Personal Factors and Comorbidities Comorbidity 1;Comorbidity 2;Comorbidity 3+;Age;Past/Current Experience;Time since onset of injury/illness/exacerbation    Comorbidities See extensive PMH above    Examination-Activity Limitations Bed Mobility;Bend;Carry;Lift;Locomotion Level;Sit;Sleep;Squat;Stairs;Stand;Toileting;Transfers    Examination-Participation Restrictions Church;Cleaning;Community Activity;Driving;Meal Prep;Shop    Stability/Clinical Decision Making Stable/Uncomplicated    Rehab Potential Good    PT Treatment/Interventions ADLs/Self Care Home Management;Aquatic Therapy;Cryotherapy;Electrical Stimulation;Iontophoresis 4mg /ml Dexamethasone;Moist Heat;Neuromuscular re-education;Balance training;Therapeutic exercise;Therapeutic activities;Functional mobility training;Stair training;Gait training;Patient/family education;Manual techniques;Energy conservation;Dry needling;Passive range of motion    PT Next Visit Plan Assess long term goals. Anticipate D/C.    PT Home Exercise Plan [id number]    Consulted and Agree with Plan of Care Patient            Patient will benefit from skilled therapeutic intervention in order to improve the following deficits and impairments:  Difficulty walking,Decreased activity tolerance,Decreased balance,Decreased mobility,Decreased strength,Pain,Obesity,Improper body mechanics,Postural dysfunction,Decreased endurance,Decreased range of motion  Visit Diagnosis: Chronic midline low back pain, unspecified whether sciatica present  Difficulty in walking, not elsewhere classified  Muscle weakness (generalized)     Problem List Patient Active Problem List   Diagnosis Date Noted  . Primary osteoarthritis of left knee 04/07/2019  . Body mass index 40.0-44.9, adult ([place]) 04/07/2019  . Morbid obesity ([place]) 04/07/2019  . DJD (degenerative joint disease) of knee 03/24/2019  . Lumbar spondylosis 03/24/2019  . Facet hypertrophy of lumbosacral region 03/24/2019  . Chronic combined systolic (congestive) and diastolic (congestive) heart failure ([place]) 01/07/2019  . Obesity, Class III, BMI 40-49.9 (morbid obesity) ([place]) 01/07/2019  . Suspected COVID-19 virus infection 01/07/2019  . Acute respiratory failure due to COVID-19 ([place]) 01/07/2019  . Anemia 12/03/2014  . CHF exacerbation ([place]) 12/03/2014  . Hypertension 12/03/2014  . Acute on chronic combined systolic and diastolic heart failure ([place])   . Rotator cuff syndrome of right shoulder 04/26/2012  . S/P TAH-BSO 04/26/2012  . Obesity 04/26/2012  . Chronic systolic heart failure ([place]) 10/17/2010  . Hypertensive heart disease 04/16/2000   Gwendolyn Grant, PT, DPT,  ATC 01/05/20 10:13 AM [facility] [address] [place], [place], [address] Phone: [phone]   Fax:  [phone]  Name: Deborah Holmes [facility] MRN: [id number] Date of Birth: 18-Apr-1950

## 2020-01-07 ENCOUNTER — Telehealth (INDEPENDENT_AMBULATORY_CARE_PROVIDER_SITE_OTHER): Payer: Medicare Other | Admitting: Nurse Practitioner

## 2020-01-07 ENCOUNTER — Ambulatory Visit: Payer: Medicare Other

## 2020-01-07 ENCOUNTER — Other Ambulatory Visit: Payer: Self-pay

## 2020-01-07 DIAGNOSIS — M1712 Unilateral primary osteoarthritis, left knee: Secondary | ICD-10-CM | POA: Diagnosis not present

## 2020-01-07 DIAGNOSIS — I10 Essential (primary) hypertension: Secondary | ICD-10-CM

## 2020-01-07 DIAGNOSIS — G8929 Other chronic pain: Secondary | ICD-10-CM | POA: Diagnosis not present

## 2020-01-07 DIAGNOSIS — M47816 Spondylosis without myelopathy or radiculopathy, lumbar region: Secondary | ICD-10-CM

## 2020-01-07 DIAGNOSIS — R262 Difficulty in walking, not elsewhere classified: Secondary | ICD-10-CM | POA: Diagnosis not present

## 2020-01-07 DIAGNOSIS — M545 Low back pain, unspecified: Secondary | ICD-10-CM | POA: Diagnosis not present

## 2020-01-07 DIAGNOSIS — M6281 Muscle weakness (generalized): Secondary | ICD-10-CM | POA: Diagnosis not present

## 2020-01-07 MED ORDER — VITAMIN D3 25 MCG PO TABS
1000.0000 [IU] | ORAL_TABLET | Freq: Every day | ORAL | 3 refills | Status: AC
Start: 2020-01-07 — End: 2021-01-06

## 2020-01-07 MED ORDER — FUROSEMIDE 20 MG PO TABS: 20.0000 mg | ORAL_TABLET | Freq: Every day | ORAL | 3 refills | Status: AC

## 2020-01-07 MED ORDER — ASPIRIN EC 81 MG PO TBEC: 81.0000 mg | DELAYED_RELEASE_TABLET | Freq: Every day | ORAL | 3 refills | Status: AC

## 2020-01-07 MED ORDER — AMLODIPINE BESYLATE 5 MG PO TABS: 5.0000 mg | ORAL_TABLET | Freq: Every day | ORAL | 3 refills | Status: AC

## 2020-01-07 MED ORDER — LOSARTAN POTASSIUM 100 MG PO TABS: 100.0000 mg | ORAL_TABLET | Freq: Every day | ORAL | 3 refills | Status: AC

## 2020-01-07 NOTE — Progress Notes (Unsigned)
   [facility] [address] [place], [place]  [address] Phone:  [phone]   Fax:  [phone] Virtual Visit via Telephone Note  I connected with Shiane Wenberg Szczesny on 01/09/20 at  3:30 PM EST by telephone and verified that I am speaking with the correct person using two identifiers.   I discussed the limitations, risks, security and privacy concerns of performing an evaluation and management service by telephone and the availability of in person appointments. I also discussed with the patient that there may be a patient responsible charge related to this service. The patient expressed understanding and agreed to proceed.  Patient home Provider Office  History of Present Illness:  Patient presents with knee pain involving the left knee. Onset of the symptoms was several years ago. Inciting event: none known. Current symptoms include giving out and pain located lateral. Pain is aggravated by walking. Patient has had prior knee problems. Evaluation to date: plain films: abnormal Progressive 3 compartmental left knee osteoarthritis, most severe. Treatment to date: avoidance of offending activity, corticosteroid injection which was not very effective and OTC analgesics which are not very effective.She admits that when she was seen in April for her knee pain that she was informed that she would have to lose weight 40 pounds. She is currently in therapy for her lower back. She was told that her tx is limited because the pain is increased because of her knee pain.    Observations/Objective: No exam virtual visit  Assessment and Plan: Assessment  Primary Diagnosis & Pertinent Problem List: The primary encounter diagnosis was Tricompartment osteoarthritis of left knee. Diagnoses of Essential hypertension and Lumbar spondylosis were also pertinent to this visit.  Visit Diagnosis: 1. Tricompartment osteoarthritis of left knee  persistent referral to ortho for second opinion    2. Essential hypertension  Stable refills requested  3. Lumbar spondylosis  Presisitant current PT not effecitve secondary to her progressive knee OA    Follow Up Instructions:    I discussed the assessment and treatment plan with the patient. The patient was provided an opportunity to ask questions and all were answered. The patient agreed with the plan and demonstrated an understanding of the instructions.   The patient was advised to call back or seek an in-person evaluation if the symptoms worsen or if the condition fails to improve as anticipated.  I provided 12 minutes of non-face-to-face time during this encounter.   Barbette Merino, NP

## 2020-01-07 NOTE — Therapy (Signed)
[facility] [address] [place], [place], [address] Phone: [phone]   Fax:  [phone]  Physical Therapy Treatment/Progress Note  Progress Note Reporting Period 12/02/19  to 01/07/20  See note below for Objective Data and Assessment of Progress/Goals.      Patient Details  Name: Deborah Holmes [facility] MRN: [id number] Date of Birth: 1950-07-07 Referring Provider (PT): Barbette Merino, NP   Encounter Date: 01/07/2020   PT End of Session - 01/07/20 0932    Visit Number 9    Number of Visits 13    Date for PT Re-Evaluation 02/06/20    Authorization Type [employer] - FOTO at visit 6 and visit 10; PN visit 10; KX modifier visit 15    PT Start Time 0930    PT Stop Time 1013    PT Time Calculation (min) 43 min    Activity Tolerance Patient tolerated treatment well    Behavior During Therapy WFL for tasks assessed/performed           Past Medical History:  Diagnosis Date  . CHF (congestive heart failure) ([facility])   . Hypertension     Past Surgical History:  Procedure Laterality Date  . COLONOSCOPY    . ROTATOR CUFF REPAIR      There were no vitals filed for this visit.   Subjective Assessment - 01/07/20 0933    Subjective "My back is killing me today. I did a lot of cleaning yesterday and within 15 minutes I began having pain and had to rest". She reports the pain was a 10/10 yesterday across the low back. She reports the pain is less today, but can still feel it in her low back. "The knee is alright"    Pertinent History see extensive PMH above. L knee osteoarthritis    Limitations Walking;Standing;House hold activities    How long can you sit comfortably? "All day on my couch"    How long can you stand comfortably? 15 minutes  cleaning yesterday    How long can you walk comfortably? 15 minutes cleaning yesterday    Diagnostic tests Imaging lumbar spine 11/09/2019: multilevel degenerative change without acute  abnormality. L knee 03/23/2019: Progressive 3 compartmental left knee osteoarthritis, most severe in the lateral and patellofemoral compartments.    Patient Stated Goals "My exercises. I'm very active. Clean up the yard. Getting out of the car." Pt was doing squats previously at a senior center and aerobic exercise that has been limited due to pain.    Currently in Pain? Yes    Pain Score 4     Pain Location Back    Pain Orientation Lower    Pain Descriptors / Indicators Dull    Pain Type Chronic pain    Pain Onset More than a month ago    Pain Frequency Intermittent    Aggravating Factors  standing,walking,    Pain Relieving Factors sitting, repeated flexion    Effect of Pain on Daily Activities difficulty performing household activities    Multiple Pain Sites No              [facility] PT Assessment - 01/07/20 0001      Observation/Other Assessments   Focus on Therapeutic Outcomes (FOTO)  54% function      AROM   Right Knee Flexion 105    Left Knee Flexion 85    Lumbar Flexion can reach floor with fingertips    Lumbar Extension WFL pain free  Lumbar - Right Side Bend lateral joint line pain    Lumbar - Left Side Bend lateral joint line pain    Lumbar - Right Rotation WFL    Lumbar - Left Rotation WFL      Strength   Right Hip Flexion 4+/5    Right Hip Extension 4-/5    Right Hip ABduction 4-/5    Left Hip Flexion 4+/5    Left Hip Extension 3/5    Left Hip ABduction 4/5    Right Knee Flexion 4+/5    Right Knee Extension 4+/5    Left Knee Flexion 4+/5    Left Knee Extension 4+/5    Right Ankle Dorsiflexion 5/5    Right Ankle Plantar Flexion 5/5    Left Ankle Dorsiflexion 5/5    Left Ankle Plantar Flexion 5/5      Palpation   Palpation comment TTP Rt lumbar paraspinals and glute max                         [facility] Adult PT Treatment/Exercise - 01/07/20 0001      Self-Care   Other Self-Care Comments  see patient education      Lumbar Exercises:  Supine   Bridge Limitations 2 x 15 partial range      Lumbar Exercises: Sidelying   Hip Abduction Limitations 2X 10      Manual Therapy   Manual therapy comments STM to Rt lumbar paraspinals and posterior glutes                  PT Education - 01/07/20 1342    Education Details Education on FOTO results and overall progress since start of care. Education on self soft tissue mobilization to lumar paraspinals and gluteal musculature. Education on POC moving forward. Recommendation to f/u with physician for Lt knee OA.    Person(s) Educated Patient    Methods Explanation;Demonstration    Comprehension Verbalized understanding;Returned demonstration            PT Short Term Goals - 01/07/20 1005      PT SHORT TERM GOAL #1   Title Patient will be independent with initial HEP.    Baseline Pt provided with initial HEP during evaluation 12/02/2019.    Time 3    Period Weeks    Status Achieved    Target Date 12/23/19      PT SHORT TERM GOAL #2   Title Patient will be able to tolerate sitting for at least 20 minutes without having to readjust due to increased symptoms/low back pain.    Baseline Pt reports being able to sit for 10 minutes before having to readjust due to LBP.    Time 3    Period Weeks    Status Achieved    Target Date 12/23/19      PT SHORT TERM GOAL #3   Title Patient will present with at least 4/5 R hip FL MMT without low back pain.    Baseline 3+/5 and pain reported in low back with resistance    Time 3    Period Weeks    Status Achieved    Target Date 12/23/19      PT SHORT TERM GOAL #4   Title Patient will be able to ascend/descend 3 6-inch steps without BUE safely without LBP to allow for safe stair negotiation to enter/exit home.    Baseline Pt is able to negotiate stairs at home and church but  has difficulty. 3 steps at home do not have handrails. Pt reports "limited a lot" to "climbing flight of stairs" on FOTO.    Time 3    Period Weeks     Status On-going    Target Date 12/23/19             PT Long Term Goals - 01/07/20 1007      PT LONG TERM GOAL #1   Title Patient will be independent with advanced HEP.    Baseline Pt provided with initial HEP during evaluation 12/02/2019.    Time 6    Period Weeks    Status On-going    Target Date 02/04/20      PT LONG TERM GOAL #2   Title Patient will be able to tolerate sitting for at least 45 minutes without having to readjust due to increased symptoms/low back pain.    Baseline Pt reports being able to sit for 10 minutes before having to readjust due to LBP.    Time 6    Period Weeks    Status Achieved      PT LONG TERM GOAL #3   Title Patient will present with at least 4+/5 R hip FL MMT without low back pain.    Baseline 3+/5 and pain reported in low back with resistance    Time 6    Period Weeks    Status Achieved      PT LONG TERM GOAL #4   Title Patient will be able to ascend/descend 12 6-inch steps with BUE as needed safely without LBP to allow for safe stair negotiation to enter/exit church each Sunday.    Baseline Pt is able to negotiate stairs at home and church (unclear if HR present at church) but has difficulty. Pt reports "limited a lot" to "climbing flight of stairs" on FOTO.    Time 6    Period Weeks    Status On-going    Target Date 02/04/20      PT LONG TERM GOAL #5   Title Patient will be able to tolerate static standing and walking for at least 45 minutes each on level ground without significant increase in pain or requiring seated rest break to allow for improved tolerance to functional activity.    Baseline 15-20 min standing and 10 min walking without BUE support tolerated. It takes pt 6 hours (6-11am) to clean her house 1x/week due to taking frequent breaks because of pain.    Time 6    Period Weeks    Status On-going    Target Date 02/04/20      PT LONG TERM GOAL #6   Title Pt's FOTO score will improve from 46% limitation to 36% limitation or  better.    Baseline 46% limited (54% function); predicted 36% limitation (64% function)    Time 6    Period Weeks    Status On-going    Target Date 02/04/20                 Plan - 01/07/20 1346    Clinical Impression Statement Patient has made steady gains in hip strength and trunk mobility since initiation of care. Her back pain is now intermittent, though fluctuates in intensity and is directly correlated to amount of standing/walking activity. Her low back pain is likely exacerbated by her Lt knee OA as she has significant limitations in Lt knee AROM and valgus alignment that perpetuate an antalgic gait and abnormal posture. She has recently began  tolerating standing activity (her Lt knee pain was previously hindering progression towards standing activity) and requires continued care to further progress core and hip strength/stability focusing on standing activity and improve body mechanics with functional tasks (squatting, bending, lifting) in order to return to optimal function. It is also recommended that she follow-up with physician regarding her Lt knee OA.    Personal Factors and Comorbidities Comorbidity 1;Comorbidity 2;Comorbidity 3+;Age;Past/Current Experience;Time since onset of injury/illness/exacerbation    Comorbidities See extensive PMH above    Examination-Activity Limitations Bed Mobility;Bend;Carry;Lift;Locomotion Level;Sit;Sleep;Squat;Stairs;Stand;Toileting;Transfers    Examination-Participation Restrictions Church;Cleaning;Community Activity;Driving;Meal Prep;Shop    Stability/Clinical Decision Making Stable/Uncomplicated    Rehab Potential Good    PT Frequency 1x / week    PT Duration 4 weeks    PT Treatment/Interventions ADLs/Self Care Home Management;Aquatic Therapy;Cryotherapy;Electrical Stimulation;Iontophoresis 4mg /ml Dexamethasone;Moist Heat;Neuromuscular re-education;Balance training;Therapeutic exercise;Therapeutic activities;Functional mobility training;Stair  training;Gait training;Patient/family education;Manual techniques;Energy conservation;Dry needling;Passive range of motion    PT Next Visit Plan Progress standing activity. Assess and educate on proper body mechanics for bending/lifting tasks as they relate to patient's household activities.    PT Home Exercise Plan [id number]    Recommended Other Services f/u with physician regarding Lt knee OA    Consulted and Agree with Plan of Care Patient           Patient will benefit from skilled therapeutic intervention in order to improve the following deficits and impairments:  Difficulty walking,Decreased activity tolerance,Decreased balance,Decreased mobility,Decreased strength,Pain,Obesity,Improper body mechanics,Postural dysfunction,Decreased endurance,Decreased range of motion  Visit Diagnosis: Chronic midline low back pain, unspecified whether sciatica present  Difficulty in walking, not elsewhere classified  Muscle weakness (generalized)     Problem List Patient Active Problem List   Diagnosis Date Noted  . Primary osteoarthritis of left knee 04/07/2019  . Body mass index 40.0-44.9, adult ([place]) 04/07/2019  . Morbid obesity ([place]) 04/07/2019  . DJD (degenerative joint disease) of knee 03/24/2019  . Lumbar spondylosis 03/24/2019  . Facet hypertrophy of lumbosacral region 03/24/2019  . Chronic combined systolic (congestive) and diastolic (congestive) heart failure ([place]) 01/07/2019  . Obesity, Class III, BMI 40-49.9 (morbid obesity) ([place]) 01/07/2019  . Suspected COVID-19 virus infection 01/07/2019  . Acute respiratory failure due to COVID-19 ([place]) 01/07/2019  . Anemia 12/03/2014  . CHF exacerbation ([place]) 12/03/2014  . Hypertension 12/03/2014  . Acute on chronic combined systolic and diastolic heart failure ([place])   . Rotator cuff syndrome of right shoulder 04/26/2012  . S/P TAH-BSO 04/26/2012  . Obesity 04/26/2012  . Chronic systolic heart failure ([place]) 10/17/2010  . Hypertensive  heart disease 04/16/2000   Gwendolyn Grant, PT, DPT, ATC 01/07/20 2:07 PM [facility] [address] [place], [place], [address] Phone: [phone]   Fax:  [phone]  Name: Merly Pistole [facility] MRN: [id number] Date of Birth: 09/16/1950

## 2020-01-07 NOTE — Addendum Note (Signed)
Addended by: Derald Macleod on: 01/07/2020 02:24 PM   Modules accepted: Orders

## 2020-01-09 ENCOUNTER — Encounter: Payer: Self-pay | Admitting: Nurse Practitioner

## 2020-01-14 ENCOUNTER — Ambulatory Visit: Payer: Medicare Other

## 2020-01-14 ENCOUNTER — Other Ambulatory Visit: Payer: Self-pay

## 2020-01-14 DIAGNOSIS — G8929 Other chronic pain: Secondary | ICD-10-CM

## 2020-01-14 DIAGNOSIS — R262 Difficulty in walking, not elsewhere classified: Secondary | ICD-10-CM | POA: Diagnosis not present

## 2020-01-14 DIAGNOSIS — M6281 Muscle weakness (generalized): Secondary | ICD-10-CM

## 2020-01-14 DIAGNOSIS — M545 Low back pain, unspecified: Secondary | ICD-10-CM

## 2020-01-14 NOTE — Therapy (Signed)
[place], [place], [address] Phone: [phone]   Fax:  [phone]  Physical Therapy Treatment  Patient Details  Name: Deborah Holmes MRN: [id number] Date of Birth: Feb 21, 1950 Referring Provider (PT): [name], NP   Encounter Date: 01/14/2020   PT End of Session - 01/14/20 1751    Visit Number 10    Number of Visits 13    Date for PT Re-Evaluation 02/06/20    Authorization Type [place] at D/C ; PN visit 68; KX modifier visit 15    PT Start Time 1530    PT Stop Time 1615    PT Time Calculation (min) 45 min    Activity Tolerance Patient tolerated treatment well    Behavior During Therapy [facility] for tasks assessed/performed           Past Medical History:  Diagnosis Date  . CHF (congestive heart failure) ([place])   . Hypertension     Past Surgical History:  Procedure Laterality Date  . COLONOSCOPY    . ROTATOR CUFF REPAIR      There were no vitals filed for this visit.   Subjective Assessment - 01/14/20 1750    Subjective "I did some sweeping and washing and I had to sit down to take a break." Pt reports some pain in back upon arrival and states L knee pain is 10/10.    Pertinent History see extensive PMH above. L knee osteoarthritis    Limitations Walking;Standing;House hold activities    How long can you sit comfortably? "All day on my couch"    How long can you stand comfortably? 15 minutes  cleaning yesterday    How long can you walk comfortably? 15 minutes cleaning yesterday    Diagnostic tests Imaging lumbar spine 11/09/2019: multilevel degenerative change without acute abnormality. L knee 03/23/2019: Progressive 3 compartmental left knee osteoarthritis, most severe in the lateral and patellofemoral compartments.    Patient Stated Goals "My exercises. I'm very active. Clean up the yard. Getting out of the car." Pt was doing squats previously at a senior center  and aerobic exercise that has been limited due to pain.    Currently in Pain? Yes    Pain Score 5     Pain Location Back    Pain Orientation Lower    Pain Descriptors / Indicators Dull    Pain Type Chronic pain    Pain Onset More than a month ago    Pain Score 10    Pain Location Knee    Pain Orientation Left;Medial    Pain Descriptors / Indicators Aching    Pain Type Chronic pain    Pain Onset More than a month ago              [facility] PT Assessment - 01/14/20 0001      Assessment   Medical Diagnosis Lumbar spondylosis M47.816, Facet hypertrophy of lumbosacral region M47.817    Referring Provider (PT) [name], NP                         [facility] Adult PT Treatment/Exercise - 01/14/20 0001      Posture/Postural Control   Posture Comments Pt rocks back while seated at EOM in order to gain momentum prior to sit > stand      Self-Care   Self-Care Other Self-Care Comments    Other Self-Care Comments  Advised pt to  maintain core engagment and upright posture during activities. Reviewed body mechanics and hip hinge to allow for decreased LBP as pt cleans her house (sweeping, laundry, etc). She demonstrates excessive bending with thoracic and lumbar spine when demonstrating how she typically sweeps. Urged pt to reach out to doctor's office regarding ortho referral for her L knee pain due to her having telehealth visit but not hearing back to schedule appt afterwards yet.      Lumbar Exercises: Stretches   Piriformis Stretch Limitations Attempted piriformis stretch in hooklying and sitting but pt reports feeling "stretch" in R groin but no stretch along piriformis even with PT overpressure so discontinued    Other Lumbar Stretch Exercise Open book in L sidelying with R upper trunk rotation x 20. Cues for increased R hip FL and for pt to slow down and allow for stretch      Lumbar Exercises: Aerobic   Nustep L2 x 5 min      Lumbar Exercises: Standing   Heel Raises  Limitations 2 x 10 with UE assist at freemotion as needed    Other Standing Lumbar Exercises Hip hinge seated and standing x 15 each with heavy cues for technique and core engagement      Lumbar Exercises: Seated   Other Seated Lumbar Exercises Demonstrated and practiced core activation and abdominal bracing to allow for pt to maintain core engagement with upright posture while sitting and performing household activities      Lumbar Exercises: Supine   Bridge with Ball Squeeze Limitations 2 x 15 partial range with cues for glute activation      Lumbar Exercises: Sidelying   Hip Abduction Limitations 2 x 10      Manual Therapy   Manual Therapy Soft tissue mobilization;Myofascial release    Soft tissue mobilization STM and myofascial release along R piriformis, lumbar paraspinals, QL, and glutes                  PT Education - 01/14/20 1751    Education Details Advised pt to maintain core engagment and upright posture during activities. Reviewed body mechanics and hip hinge to allow for decreased LBP as pt cleans her house (sweeping, laundry, etc). She demonstrates excessive bending with thoracic and lumbar spine when demonstrating how she typically sweeps. Urged pt to reach out to doctor's office regarding ortho referral for her L knee pain due to her having telehealth visit but not hearing back to schedule appt afterwards yet.    Person(s) Educated Patient    Methods Explanation;Demonstration    Comprehension Verbalized understanding;Returned demonstration            PT Short Term Goals - 01/07/20 1005      PT SHORT TERM GOAL #1   Title Patient will be independent with initial HEP.    Baseline Pt provided with initial HEP during evaluation 12/02/2019.    Time 3    Period Weeks    Status Achieved    Target Date 12/23/19      PT SHORT TERM GOAL #2   Title Patient will be able to tolerate sitting for at least 20 minutes without having to readjust due to increased  symptoms/low back pain.    Baseline Pt reports being able to sit for 10 minutes before having to readjust due to LBP.    Time 3    Period Weeks    Status Achieved    Target Date 12/23/19      PT SHORT TERM GOAL #  3   Title Patient will present with at least 4/5 R hip FL MMT without low back pain.    Baseline 3+/5 and pain reported in low back with resistance    Time 3    Period Weeks    Status Achieved    Target Date 12/23/19      PT SHORT TERM GOAL #4   Title Patient will be able to ascend/descend 3 6-inch steps without BUE safely without LBP to allow for safe stair negotiation to enter/exit home.    Baseline Pt is able to negotiate stairs at home and church but has difficulty. 3 steps at home do not have handrails. Pt reports "limited a lot" to "climbing flight of stairs" on FOTO.    Time 3    Period Weeks    Status On-going    Target Date 12/23/19             PT Long Term Goals - 01/07/20 1007      PT LONG TERM GOAL #1   Title Patient will be independent with advanced HEP.    Baseline Pt provided with initial HEP during evaluation 12/02/2019.    Time 6    Period Weeks    Status On-going    Target Date 02/04/20      PT LONG TERM GOAL #2   Title Patient will be able to tolerate sitting for at least 45 minutes without having to readjust due to increased symptoms/low back pain.    Baseline Pt reports being able to sit for 10 minutes before having to readjust due to LBP.    Time 6    Period Weeks    Status Achieved      PT LONG TERM GOAL #3   Title Patient will present with at least 4+/5 R hip FL MMT without low back pain.    Baseline 3+/5 and pain reported in low back with resistance    Time 6    Period Weeks    Status Achieved      PT LONG TERM GOAL #4   Title Patient will be able to ascend/descend 12 6-inch steps with BUE as needed safely without LBP to allow for safe stair negotiation to enter/exit church each Sunday.    Baseline Pt is able to negotiate stairs at  home and church (unclear if HR present at church) but has difficulty. Pt reports "limited a lot" to "climbing flight of stairs" on FOTO.    Time 6    Period Weeks    Status On-going    Target Date 02/04/20      PT LONG TERM GOAL #5   Title Patient will be able to tolerate static standing and walking for at least 45 minutes each on level ground without significant increase in pain or requiring seated rest break to allow for improved tolerance to functional activity.    Baseline 15-20 min standing and 10 min walking without BUE support tolerated. It takes pt 6 hours (6-11am) to clean her house 1x/week due to taking frequent breaks because of pain.    Time 6    Period Weeks    Status On-going    Target Date 02/04/20      PT LONG TERM GOAL #6   Title Pt's FOTO score will improve from 46% limitation to 36% limitation or better.    Baseline 46% limited (54% function); predicted 36% limitation (64% function)    Time 6    Period Weeks    Status  On-going    Target Date 02/04/20                 Plan - 01/14/20 1752    Clinical Impression Statement Patient tolerated treatment session well with ability to perform standing hip hinge and heel raises without aggravation of L knee symptoms. She reports her low back pain eased following interventions and manual techniques. Encouraged patient to increase postural awareness and control as she demonstrates slouched posture in sitting and reports that is how she sits on her couch. Focus on core engagement and body mechanics in addition to stretching for pt to have improved tolerance with functional tasks.    Personal Factors and Comorbidities Comorbidity 1;Comorbidity 2;Comorbidity 3+;Age;Past/Current Experience;Time since onset of injury/illness/exacerbation    Comorbidities See extensive PMH above    Examination-Activity Limitations Bed Mobility;Bend;Carry;Lift;Locomotion Level;Sit;Sleep;Squat;Stairs;Stand;Toileting;Transfers     Examination-Participation Restrictions Church;Cleaning;Community Activity;Driving;Meal Prep;Shop    Stability/Clinical Decision Making Stable/Uncomplicated    Rehab Potential Good    PT Frequency 1x / week    PT Duration 4 weeks    PT Treatment/Interventions ADLs/Self Care Home Management;Aquatic Therapy;Cryotherapy;Electrical Stimulation;Iontophoresis 4mg /ml Dexamethasone;Moist Heat;Neuromuscular re-education;Balance training;Therapeutic exercise;Therapeutic activities;Functional mobility training;Stair training;Gait training;Patient/family education;Manual techniques;Energy conservation;Dry needling;Passive range of motion    PT Next Visit Plan Progress standing activity and core strength. Assess and educate on proper body mechanics for bending/lifting tasks as they relate to patient's household activities.    PT Home Exercise Plan [email or web address]    Consulted and Agree with Plan of Care Patient           Patient will benefit from skilled therapeutic intervention in order to improve the following deficits and impairments:  Difficulty walking,Decreased activity tolerance,Decreased balance,Decreased mobility,Decreased strength,Pain,Obesity,Improper body mechanics,Postural dysfunction,Decreased endurance,Decreased range of motion  Visit Diagnosis: Chronic midline low back pain, unspecified whether sciatica present  Difficulty in walking, not elsewhere classified  Muscle weakness (generalized)     Problem List Patient Active Problem List   Diagnosis Date Noted  . Primary osteoarthritis of left knee 04/07/2019  . Body mass index 40.0-44.9, adult ([place]) 04/07/2019  . Morbid obesity ([place]) 04/07/2019  . DJD (degenerative joint disease) of knee 03/24/2019  . Lumbar spondylosis 03/24/2019  . Facet hypertrophy of lumbosacral region 03/24/2019  . Chronic combined systolic (congestive) and diastolic (congestive) heart failure ([place]) 01/07/2019  . Obesity, Class III, BMI 40-49.9 (morbid obesity) ([place])  01/07/2019  . Suspected COVID-19 virus infection 01/07/2019  . Acute respiratory failure due to COVID-19 ([place]) 01/07/2019  . Anemia 12/03/2014  . CHF exacerbation ([place]) 12/03/2014  . Hypertension 12/03/2014  . Acute on chronic combined systolic and diastolic heart failure ([place])   . Rotator cuff syndrome of right shoulder 04/26/2012  . S/P TAH-BSO 04/26/2012  . Obesity 04/26/2012  . Chronic systolic heart failure ([place]) 10/17/2010  . Hypertensive heart disease 04/16/2000    Haydee Monica, PT, DPT 01/14/20 6:00 PM  [facility] [address] [place], [place], [address] Phone: [phone]   Fax:  [phone]  Name: Deborah Holmes [facility] MRN: [id number] Date of Birth: 1950/08/03

## 2020-01-16 ENCOUNTER — Other Ambulatory Visit: Payer: Self-pay | Admitting: Nurse Practitioner

## 2020-01-28 ENCOUNTER — Ambulatory Visit: Payer: Medicare Other

## 2020-01-28 ENCOUNTER — Other Ambulatory Visit: Payer: Self-pay

## 2020-01-28 DIAGNOSIS — R262 Difficulty in walking, not elsewhere classified: Secondary | ICD-10-CM | POA: Diagnosis not present

## 2020-01-28 DIAGNOSIS — M545 Low back pain, unspecified: Secondary | ICD-10-CM | POA: Diagnosis not present

## 2020-01-28 DIAGNOSIS — M6281 Muscle weakness (generalized): Secondary | ICD-10-CM | POA: Diagnosis not present

## 2020-01-28 DIAGNOSIS — G8929 Other chronic pain: Secondary | ICD-10-CM | POA: Diagnosis not present

## 2020-01-28 NOTE — Therapy (Signed)
[place], [place], [address] Phone: [phone]   Fax:  [phone]  Physical Therapy Treatment  Patient Details  Name: Deborah Holmes MRN: [id number] Date of Birth: May 16, 1950 Referring Provider (PT): [name], NP   Encounter Date: 01/28/2020   PT End of Session - 01/28/20 1023    Visit Number 11    Number of Visits 13    Date for PT Re-Evaluation 02/06/20    Authorization Type [place] at D/C ; PN visit 67; KX modifier visit 15    PT Start Time 1017    PT Stop Time 1058    PT Time Calculation (min) 41 min    Activity Tolerance Patient tolerated treatment well    Behavior During Therapy WFL for tasks assessed/performed           Past Medical History:  Diagnosis Date  . CHF (congestive heart failure) ([place])   . Hypertension     Past Surgical History:  Procedure Laterality Date  . COLONOSCOPY    . ROTATOR CUFF REPAIR      There were no vitals filed for this visit.   Subjective Assessment - 01/28/20 1022    Subjective "I feel about the same." Patient has appointment with physician for he ongoing Lt knee pain on 02/05/20. She reports compliance with HEP, but doesn't feel it helps with her back pain anymore.    Pertinent History see extensive PMH above. L knee osteoarthritis    Limitations Walking;Standing;House hold activities    How long can you sit comfortably? "All day on my couch"    How long can you stand comfortably? 15 minutes  cleaning yesterday    How long can you walk comfortably? 15 minutes cleaning yesterday    Diagnostic tests Imaging lumbar spine 11/09/2019: multilevel degenerative change without acute abnormality. L knee 03/23/2019: Progressive 3 compartmental left knee osteoarthritis, most severe in the lateral and patellofemoral compartments.    Patient Stated Goals "My exercises. I'm very active. Clean up the yard. Getting out of the car." Pt was  doing squats previously at a senior center and aerobic exercise that has been limited due to pain.    Currently in Pain? Yes    Pain Score 6     Pain Location Back    Pain Orientation Lower    Pain Descriptors / Indicators Dull    Pain Type Chronic pain    Pain Onset More than a month ago    Multiple Pain Sites Yes    Pain Score 3    Pain Location Knee    Pain Orientation Left    Pain Descriptors / Indicators Aching    Pain Type Chronic pain    Pain Onset More than a month ago                             [facility] Adult PT Treatment/Exercise - 01/28/20 0001      Self-Care   Other Self-Care Comments  see patienteducation      Lumbar Exercises: Aerobic   Nustep L5 x 5 min      Lumbar Exercises: Standing   Other Standing Lumbar Exercises Hip hinge seated 2 x 10 and standing 2 x 10      Lumbar Exercises: Seated   Other Seated Lumbar Exercises TA march 3x  20    Other Seated Lumbar Exercises pelvic tilts 2 x 10  Lumbar Exercises: Supine   Other Supine Lumbar Exercises LTR 2 min      Knee/Hip Exercises: Standing   Other Standing Knee Exercises mini squat with HHA 1 x 5                  PT Education - 01/28/20 1056    Education Details Postural education. Use of towel as lumbar support roll during sitting. Education on body mechanics during cleaning.            PT Short Term Goals - 01/07/20 1005      PT SHORT TERM GOAL #1   Title Patient will be independent with initial HEP.    Baseline Pt provided with initial HEP during evaluation 12/02/2019.    Time 3    Period Weeks    Status Achieved    Target Date 12/23/19      PT SHORT TERM GOAL #2   Title Patient will be able to tolerate sitting for at least 20 minutes without having to readjust due to increased symptoms/low back pain.    Baseline Pt reports being able to sit for 10 minutes before having to readjust due to LBP.    Time 3    Period Weeks    Status Achieved    Target Date  12/23/19      PT SHORT TERM GOAL #3   Title Patient will present with at least 4/5 R hip FL MMT without low back pain.    Baseline 3+/5 and pain reported in low back with resistance    Time 3    Period Weeks    Status Achieved    Target Date 12/23/19      PT SHORT TERM GOAL #4   Title Patient will be able to ascend/descend 3 6-inch steps without BUE safely without LBP to allow for safe stair negotiation to enter/exit home.    Baseline Pt is able to negotiate stairs at home and church but has difficulty. 3 steps at home do not have handrails. Pt reports "limited a lot" to "climbing flight of stairs" on FOTO.    Time 3    Period Weeks    Status On-going    Target Date 12/23/19             PT Long Term Goals - 01/07/20 1007      PT LONG TERM GOAL #1   Title Patient will be independent with advanced HEP.    Baseline Pt provided with initial HEP during evaluation 12/02/2019.    Time 6    Period Weeks    Status On-going    Target Date 02/04/20      PT LONG TERM GOAL #2   Title Patient will be able to tolerate sitting for at least 45 minutes without having to readjust due to increased symptoms/low back pain.    Baseline Pt reports being able to sit for 10 minutes before having to readjust due to LBP.    Time 6    Period Weeks    Status Achieved      PT LONG TERM GOAL #3   Title Patient will present with at least 4+/5 R hip FL MMT without low back pain.    Baseline 3+/5 and pain reported in low back with resistance    Time 6    Period Weeks    Status Achieved      PT LONG TERM GOAL #4   Title Patient will be able to ascend/descend 12 6-inch  steps with BUE as needed safely without LBP to allow for safe stair negotiation to enter/exit church each Sunday.    Baseline Pt is able to negotiate stairs at home and church (unclear if HR present at church) but has difficulty. Pt reports "limited a lot" to "climbing flight of stairs" on FOTO.    Time 6    Period Weeks    Status  On-going    Target Date 02/04/20      PT LONG TERM GOAL #5   Title Patient will be able to tolerate static standing and walking for at least 45 minutes each on level ground without significant increase in pain or requiring seated rest break to allow for improved tolerance to functional activity.    Baseline 15-20 min standing and 10 min walking without BUE support tolerated. It takes pt 6 hours (6-11am) to clean her house 1x/week due to taking frequent breaks because of pain.    Time 6    Period Weeks    Status On-going    Target Date 02/04/20      PT LONG TERM GOAL #6   Title Pt's FOTO score will improve from 46% limitation to 36% limitation or better.    Baseline 46% limited (54% function); predicted 36% limitation (64% function)    Time 6    Period Weeks    Status On-going    Target Date 02/04/20                 Plan - 01/28/20 1024    Clinical Impression Statement Session focused on seated postural correctives as patient demonstrates slump posturing having difficulty achieving and maintaining anterior pelvic tilt. Focused on educating patient on proper bending mechanics as she demonstrates excessive trunk flexion and no knee flexion when she demonstrates how she cleans and picks up objects. With continued practice and heavy cues patient able to complete mini squat and partial hip hinge in standing, though without cues patient reverts to aberrant body mechanics.    Personal Factors and Comorbidities Comorbidity 1;Comorbidity 2;Comorbidity 3+;Age;Past/Current Experience;Time since onset of injury/illness/exacerbation    Comorbidities See extensive PMH above    Examination-Activity Limitations Bed Mobility;Bend;Carry;Lift;Locomotion Level;Sit;Sleep;Squat;Stairs;Stand;Toileting;Transfers    Examination-Participation Restrictions Church;Cleaning;Community Activity;Driving;Meal Prep;Shop    Stability/Clinical Decision Making Stable/Uncomplicated    Rehab Potential Good    PT  Frequency 1x / week    PT Duration 4 weeks    PT Treatment/Interventions ADLs/Self Care Home Management;Aquatic Therapy;Cryotherapy;Electrical Stimulation;Iontophoresis 4mg /ml Dexamethasone;Moist Heat;Neuromuscular re-education;Balance training;Therapeutic exercise;Therapeutic activities;Functional mobility training;Stair training;Gait training;Patient/family education;Manual techniques;Energy conservation;Dry needling;Passive range of motion    PT Next Visit Plan Update HEP. Anticipate D/C to home program.    PT Home Exercise Plan [id number]    Consulted and Agree with Plan of Care Patient           Patient will benefit from skilled therapeutic intervention in order to improve the following deficits and impairments:  Difficulty walking,Decreased activity tolerance,Decreased balance,Decreased mobility,Decreased strength,Pain,Obesity,Improper body mechanics,Postural dysfunction,Decreased endurance,Decreased range of motion  Visit Diagnosis: Chronic midline low back pain, unspecified whether sciatica present  Difficulty in walking, not elsewhere classified  Muscle weakness (generalized)     Problem List Patient Active Problem List   Diagnosis Date Noted  . Primary osteoarthritis of left knee 04/07/2019  . Body mass index 40.0-44.9, adult ([place]) 04/07/2019  . Morbid obesity ([place]) 04/07/2019  . DJD (degenerative joint disease) of knee 03/24/2019  . Lumbar spondylosis 03/24/2019  . Facet hypertrophy of lumbosacral region 03/24/2019  . Chronic combined  systolic (congestive) and diastolic (congestive) heart failure ([place]) 01/07/2019  . Obesity, Class III, BMI 40-49.9 (morbid obesity) ([place]) 01/07/2019  . Suspected COVID-19 virus infection 01/07/2019  . Acute respiratory failure due to COVID-19 ([place]) 01/07/2019  . Anemia 12/03/2014  . CHF exacerbation ([place]) 12/03/2014  . Hypertension 12/03/2014  . Acute on chronic combined systolic and diastolic heart failure ([place])   . Rotator cuff  syndrome of right shoulder 04/26/2012  . S/P TAH-BSO 04/26/2012  . Obesity 04/26/2012  . Chronic systolic heart failure ([place]) 10/17/2010  . Hypertensive heart disease 04/16/2000   Gwendolyn Grant, PT, DPT, ATC 01/28/20 11:03 AM [facility] [address] [place], [place], [address] Phone: [phone]   Fax:  [phone]  Name: Deborah Holmes [facility] MRN: [id number] Date of Birth: 07/05/1950

## 2020-02-04 ENCOUNTER — Ambulatory Visit: Payer: Medicare Other | Attending: Nurse Practitioner

## 2020-02-04 ENCOUNTER — Telehealth: Payer: Self-pay

## 2020-02-04 ENCOUNTER — Telehealth: Payer: Self-pay | Admitting: Nurse Practitioner

## 2020-02-04 DIAGNOSIS — M6281 Muscle weakness (generalized): Secondary | ICD-10-CM | POA: Insufficient documentation

## 2020-02-04 DIAGNOSIS — G8929 Other chronic pain: Secondary | ICD-10-CM | POA: Insufficient documentation

## 2020-02-04 DIAGNOSIS — M545 Low back pain, unspecified: Secondary | ICD-10-CM | POA: Insufficient documentation

## 2020-02-04 DIAGNOSIS — R262 Difficulty in walking, not elsewhere classified: Secondary | ICD-10-CM | POA: Insufficient documentation

## 2020-02-04 NOTE — Telephone Encounter (Signed)
Patient states she called earlier to cancel this appointment secondary to her car being in the shop and not having transportation. Patient rescheduled appointment to next week.

## 2020-02-04 NOTE — Telephone Encounter (Signed)
Called and spoke with patient she want to know the name and address of where she needed to  go for her about , [facility] [address]  [address]  Gave her number and address , detail direction

## 2020-02-05 ENCOUNTER — Telehealth: Payer: Self-pay | Admitting: Nurse Practitioner

## 2020-02-05 DIAGNOSIS — M1712 Unilateral primary osteoarthritis, left knee: Secondary | ICD-10-CM | POA: Diagnosis not present

## 2020-02-05 NOTE — Telephone Encounter (Signed)
This was handle yesterday

## 2020-02-08 ENCOUNTER — Telehealth: Payer: Self-pay | Admitting: Nurse Practitioner

## 2020-02-08 NOTE — Telephone Encounter (Signed)
Please schedule her for an apt. I was not aware that she was waiting for a call.  Louretta Shorten, can you please help with this .  Thank you.

## 2020-02-09 NOTE — Telephone Encounter (Signed)
Called and spoke w /pt she has a runny nose and headache, started yesterday. She has an appt with you on tomorrow at 140pm.

## 2020-02-10 ENCOUNTER — Encounter: Payer: Self-pay | Admitting: Nurse Practitioner

## 2020-02-10 ENCOUNTER — Ambulatory Visit: Payer: Medicare Other

## 2020-02-10 ENCOUNTER — Other Ambulatory Visit: Payer: Self-pay

## 2020-02-10 ENCOUNTER — Telehealth (INDEPENDENT_AMBULATORY_CARE_PROVIDER_SITE_OTHER): Payer: Medicare Other | Admitting: Nurse Practitioner

## 2020-02-10 VITALS — Ht 66.0 in | Wt 265.0 lb

## 2020-02-10 DIAGNOSIS — R262 Difficulty in walking, not elsewhere classified: Secondary | ICD-10-CM

## 2020-02-10 DIAGNOSIS — R519 Headache, unspecified: Secondary | ICD-10-CM

## 2020-02-10 DIAGNOSIS — G8929 Other chronic pain: Secondary | ICD-10-CM

## 2020-02-10 DIAGNOSIS — J019 Acute sinusitis, unspecified: Secondary | ICD-10-CM

## 2020-02-10 DIAGNOSIS — M6281 Muscle weakness (generalized): Secondary | ICD-10-CM

## 2020-02-10 DIAGNOSIS — M545 Low back pain, unspecified: Secondary | ICD-10-CM | POA: Diagnosis not present

## 2020-02-10 NOTE — Progress Notes (Signed)
   [name], [place]  [address] Phone:  [phone]   Fax:  [phone] Virtual Visit via Telephone Note  I connected with Deborah Holmes on 02/10/20 at  1:40 PM EST by telephone and verified that I am speaking with the correct person using two identifiers.   I discussed the limitations, risks, security and privacy concerns of performing an evaluation and management service by telephone and the availability of in person appointments. I also discussed with the patient that there may be a patient responsible charge related to this service. The patient expressed understanding and agreed to proceed.  Patient home Provider Office  History of Present Illness: Upper Respiratory Infection She admits that last week she had a sinus infection. She was having left sided head pain. She went to the drug store and got some OTC. Sudafed. She has some post nasal drainage. She does not generally have sinus infections. She is feeling better overall. She has completed physical therapy. She has seen the orthopedic for the second opinion and will assist with possible surgery if she has a weight lose of 10 pounds. She admits that she is working to achieve this goal.   Observations/Objective:  Virtual visit no exam  Assessment and Plan:   Assessment  Primary Diagnosis & Pertinent Problem List: The primary encounter diagnosis was Facial pain. A diagnosis of Acute sinusitis with symptoms greater than 10 days was also pertinent to this visit.  Visit Diagnosis: 1. Facial pain  Stable continue with current treatment.  2. Acute sinusitis with symptoms less than 10 days     Follow Up Instructions: As scheduled   I discussed the assessment and treatment plan with the patient. The patient was provided an opportunity to ask questions and all were answered. The patient agreed with the plan and demonstrated an understanding of the instructions.   The patient was  advised to call back or seek an in-person evaluation if the symptoms worsen or if the condition fails to improve as anticipated.  I provided 10 minutes of video- face-to-face time during this encounter.   [name], NP

## 2020-02-10 NOTE — Patient Instructions (Signed)

## 2020-02-10 NOTE — Therapy (Signed)
[facility] [place], [place], [address] Phone: [phone]   Fax:  [phone]  Physical Therapy Treatment/Discharge  Patient Details  Name: Deborah Holmes MRN: [id number] Date of Birth: 01-11-1950 Referring Provider (PT): [name], NP   Encounter Date: 02/10/2020   PT End of Session - 02/10/20 1217    Visit Number 12    Number of Visits 13    Date for PT Re-Evaluation 02/06/20    Authorization Type [facility] at D/C ; PN visit 20; KX modifier visit 15    PT Start Time 1147    PT Stop Time 1230    PT Time Calculation (min) 43 min    Activity Tolerance Patient tolerated treatment well    Behavior During Therapy WFL for tasks assessed/performed           Past Medical History:  Diagnosis Date  . CHF (congestive heart failure) ([place])   . Hypertension     Past Surgical History:  Procedure Laterality Date  . COLONOSCOPY    . ROTATOR CUFF REPAIR      There were no vitals filed for this visit.   Subjective Assessment - 02/10/20 1150    Subjective Patient reports her back is feeling about the same. She had f/u with physicain in regards to her worsening Lt knee pain and was instructed that she needs to lose 10 lbs prior to being considered a surgical candidate for TKA. She has f/u with physician in April and has started to work on weight loss. She denies pain in sitting, but has significant Lt knee pain when she walks and moderate back pain.    Pertinent History see extensive PMH above. L knee osteoarthritis    Limitations Walking;Standing;House hold activities    How long can you sit comfortably? "All day on my couch"    How long can you stand comfortably? "About an hour" (in regards to low back pain) "About 10 minutes" (in regards to Lt knee)    How long can you walk comfortably? "About an hour" (in regards to low back pain) "About 10 minutes" (in regards to Lt knee)    Diagnostic  tests Imaging lumbar spine 11/09/2019: multilevel degenerative change without acute abnormality. L knee 03/23/2019: Progressive 3 compartmental left knee osteoarthritis, most severe in the lateral and patellofemoral compartments.    Patient Stated Goals "My exercises. I'm very active. Clean up the yard. Getting out of the car." Pt was doing squats previously at a senior center and aerobic exercise that has been limited due to pain.    Currently in Pain? No/denies    Pain Onset More than a month ago    Pain Onset More than a month ago              [facility] PT Assessment - 02/10/20 0001      Observation/Other Assessments   Focus on Therapeutic Outcomes (FOTO)  59% function      AROM   Overall AROM Comments full and pain free lumbar AROM    Right Knee Flexion 105    Left Knee Flexion 86      Strength   Right Hip Flexion 5/5    Right Hip Extension 4-/5    Right Hip ABduction 4-/5    Left Hip Flexion 5/5    Left Hip Extension 3/5    Left Hip ABduction 4/5    Right Knee Flexion 5/5    Right Knee Extension 5/5  Left Knee Flexion 5/5    Left Knee Extension 5/5    Right Ankle Dorsiflexion 5/5    Right Ankle Plantar Flexion 5/5    Left Ankle Dorsiflexion 5/5    Left Ankle Plantar Flexion 5/5                         [facility] Adult PT Treatment/Exercise - 02/10/20 0001      Self-Care   Other Self-Care Comments  see patient education      Lumbar Exercises: Seated   Other Seated Lumbar Exercises TA march 2 x 20    Other Seated Lumbar Exercises pelvic tilt 2 x 10      Lumbar Exercises: Supine   Bridge Limitations 2 x 10 partial range    Straight Leg Raises Limitations 2  x10      Lumbar Exercises: Sidelying   Clam 10 reps    Clam Limitations x 2; blue band                  PT Education - 02/10/20 1416    Education Details Discharge education including overall functional progress. Recommendation on continuing with weight loss regimen in terms of decreasing  sugar intake and remaining physically active and to f/u with physician for scheduled appointment in April. Updated HEP. Postural education.    Person(s) Educated Patient    Methods Explanation;Demonstration;Handout;Verbal cues    Comprehension Verbalized understanding;Returned demonstration            PT Short Term Goals - 02/10/20 1209      PT SHORT TERM GOAL #1   Title Patient will be independent with initial HEP.    Baseline patient reports compliance with HEP    Time 3    Period Weeks    Status Achieved    Target Date 12/23/19      PT SHORT TERM GOAL #2   Title Patient will be able to tolerate sitting for at least 20 minutes without having to readjust due to increased symptoms/low back pain.    Baseline patient reports being able to sit as long as she wants    Time 3    Period Weeks    Status Achieved    Target Date 12/23/19      PT SHORT TERM GOAL #3   Title Patient will present with at least 4/5 R hip FL MMT without low back pain.    Baseline see flowsheet    Time 3    Period Weeks    Status Achieved    Target Date 12/23/19      PT SHORT TERM GOAL #4   Title Patient will be able to ascend/descend 3 6-inch steps without BUE safely without LBP to allow for safe stair negotiation to enter/exit home.    Baseline Patient reports ability to ascend/descend 3 steps at home wihtout low back pain, though has Lt knee pain making it difficult    Time 3    Period Weeks    Status Achieved    Target Date 12/23/19             PT Long Term Goals - 02/10/20 1210      PT LONG TERM GOAL #1   Title Patient will be independent with advanced HEP.    Baseline Advanced HEP issued    Time 6    Period Weeks    Status Achieved      PT LONG TERM GOAL #2  Title Patient will be able to tolerate sitting for at least 45 minutes without having to readjust due to increased symptoms/low back pain.    Baseline Patient reports she can sit as long as she wants    Time 6    Period Weeks     Status Achieved      PT LONG TERM GOAL #3   Title Patient will present with at least 4+/5 R hip FL MMT without low back pain.    Baseline see flowsheet    Time 6    Period Weeks    Status Achieved      PT LONG TERM GOAL #4   Title Patient will be able to ascend/descend 12 6-inch steps with BUE as needed safely without LBP to allow for safe stair negotiation to enter/exit church each Sunday.    Baseline Patient reports no difficulty with going up/down stairs at church in regards to her low back, but her Lt knee makes it difficult    Time 6    Period Weeks    Status Achieved      PT LONG TERM GOAL #5   Title Patient will be able to tolerate static standing and walking for at least 45 minutes each on level ground without significant increase in pain or requiring seated rest break to allow for improved tolerance to functional activity.    Baseline Patient tolerates one hour of standing/walking before onset of low back, however Lt knee pain within 10 minutes.    Time 6    Period Weeks    Status On-going      PT LONG TERM GOAL #6   Title Pt's FOTO score will improve from 46% limitation to 36% limitation or better.    Baseline 59% function    Time 6    Period Weeks    Status On-going                 Plan - 02/10/20 1207    Clinical Impression Statement Patient has progressed farily well throuhgout duration of care having partially met established functional goals as they relate to her low back pain. She demonstrates full and pain free lumbar AROM as well as improved hip flexor strength and increaesd activity tolerance as it relates to her low back. Throughout her course of care she has reported ongoing and worsening Lt knee pain and has recently had f/u with physicain regarding this cheif complaint with plans to begin weight loss in hope for TKA in the future. She has met her maximal rehab potential at this time and is discharged to home program with recommendation to continue her  weight loss regimen prior to follow-up with physician for her Lt knee pain in April.    Personal Factors and Comorbidities Comorbidity 1;Comorbidity 2;Comorbidity 3+;Age;Past/Current Experience;Time since onset of injury/illness/exacerbation    Comorbidities See extensive PMH above    Examination-Activity Limitations --    Examination-Participation Restrictions --    Stability/Clinical Decision Making --    Rehab Potential Good    PT Frequency --    PT Duration --    PT Treatment/Interventions ADLs/Self Care Home Management;Aquatic Therapy;Cryotherapy;Electrical Stimulation;Iontophoresis 53m/ml Dexamethasone;Moist Heat;Neuromuscular re-education;Balance training;Therapeutic exercise;Therapeutic activities;Functional mobility training;Stair training;Gait training;Patient/family education;Manual techniques;Energy conservation;Dry needling;Passive range of motion    PT Next Visit Plan D/C    PT Home Exercise Plan [email or web address]   Recommended Other Services f/u with physician for Lt knee    Consulted and Agree with Plan of Care Patient  Patient will benefit from skilled therapeutic intervention in order to improve the following deficits and impairments:  Difficulty walking,Decreased activity tolerance,Decreased balance,Decreased mobility,Decreased strength,Pain,Obesity,Improper body mechanics,Postural dysfunction,Decreased endurance,Decreased range of motion  Visit Diagnosis: Chronic midline low back pain, unspecified whether sciatica present  Difficulty in walking, not elsewhere classified  Muscle weakness (generalized)     Problem List Patient Active Problem List   Diagnosis Date Noted  . Primary osteoarthritis of left knee 04/07/2019  . Body mass index 40.0-44.9, adult ([place]) 04/07/2019  . Morbid obesity ([place]) 04/07/2019  . DJD (degenerative joint disease) of knee 03/24/2019  . Lumbar spondylosis 03/24/2019  . Facet hypertrophy of lumbosacral region 03/24/2019  . Chronic  combined systolic (congestive) and diastolic (congestive) heart failure ([place]) 01/07/2019  . Obesity, Class III, BMI 40-49.9 (morbid obesity) ([place]) 01/07/2019  . Suspected COVID-19 virus infection 01/07/2019  . Acute respiratory failure due to COVID-19 ([place]) 01/07/2019  . Anemia 12/03/2014  . CHF exacerbation ([place]) 12/03/2014  . Hypertension 12/03/2014  . Acute on chronic combined systolic and diastolic heart failure ([place])   . Rotator cuff syndrome of right shoulder 04/26/2012  . S/P TAH-BSO 04/26/2012  . Obesity 04/26/2012  . Chronic systolic heart failure ([place]) 10/17/2010  . Hypertensive heart disease 04/16/2000  PHYSICAL THERAPY DISCHARGE SUMMARY  Visits from Start of Care: 12  Current functional level related to goals / functional outcomes: See above   Remaining deficits: See above   Education / Equipment: See above   Plan: Patient agrees to discharge.  Patient goals were partially met. Patient is being discharged due to                                                     ?????patient has met maximal rehab potential.          Gwendolyn Grant, PT, DPT, ATC 02/10/20 2:23 PM  [place], [place], [address] Phone: [phone]   Fax:  [phone]  Name: Deborah Holmes [facility] MRN: [id number] Date of Birth: 02/03/1950

## 2020-03-04 ENCOUNTER — Other Ambulatory Visit: Payer: Medicare Other

## 2020-03-05 ENCOUNTER — Ambulatory Visit
Admission: RE | Admit: 2020-03-05 | Discharge: 2020-03-05 | Disposition: A | Discharge status: Home / Self Care | Payer: Medicare Other | Source: Ambulatory Visit | Attending: Nurse Practitioner | Admitting: Nurse Practitioner

## 2020-03-05 ENCOUNTER — Other Ambulatory Visit: Payer: Self-pay

## 2020-03-05 DIAGNOSIS — Z780 Asymptomatic menopausal state: Secondary | ICD-10-CM | POA: Diagnosis not present

## 2020-03-05 DIAGNOSIS — Z13820 Encounter for screening for osteoporosis: Secondary | ICD-10-CM | POA: Diagnosis not present

## 2020-04-08 DIAGNOSIS — R7303 Prediabetes: Secondary | ICD-10-CM | POA: Diagnosis not present

## 2020-04-08 DIAGNOSIS — Z008 Encounter for other general examination: Secondary | ICD-10-CM | POA: Diagnosis not present

## 2020-04-08 DIAGNOSIS — I5042 Chronic combined systolic (congestive) and diastolic (congestive) heart failure: Secondary | ICD-10-CM | POA: Diagnosis not present

## 2020-04-08 DIAGNOSIS — E559 Vitamin D deficiency, unspecified: Secondary | ICD-10-CM | POA: Diagnosis not present

## 2020-04-08 DIAGNOSIS — M8949 Other hypertrophic osteoarthropathy, multiple sites: Secondary | ICD-10-CM | POA: Diagnosis not present

## 2020-04-08 DIAGNOSIS — R911 Solitary pulmonary nodule: Secondary | ICD-10-CM | POA: Diagnosis not present

## 2020-04-08 DIAGNOSIS — I110 Hypertensive heart disease with heart failure: Secondary | ICD-10-CM | POA: Diagnosis not present

## 2020-04-08 DIAGNOSIS — Z0000 Encounter for general adult medical examination without abnormal findings: Secondary | ICD-10-CM | POA: Diagnosis not present

## 2020-04-08 DIAGNOSIS — Z79899 Other long term (current) drug therapy: Secondary | ICD-10-CM | POA: Diagnosis not present

## 2020-04-08 DIAGNOSIS — R2681 Unsteadiness on feet: Secondary | ICD-10-CM | POA: Diagnosis not present

## 2020-04-21 ENCOUNTER — Other Ambulatory Visit: Payer: Self-pay

## 2020-04-21 ENCOUNTER — Emergency Department (HOSPITAL_COMMUNITY)
Admission: EM | Admit: 2020-04-21 | Discharge: 2020-04-21 | Disposition: A | Discharge status: Home / Self Care | Payer: Medicare Other | Attending: Emergency Medicine | Admitting: Emergency Medicine

## 2020-04-21 ENCOUNTER — Encounter (HOSPITAL_COMMUNITY): Payer: Self-pay

## 2020-04-21 DIAGNOSIS — Z7722 Contact with and (suspected) exposure to environmental tobacco smoke (acute) (chronic): Secondary | ICD-10-CM | POA: Insufficient documentation

## 2020-04-21 DIAGNOSIS — Z79899 Other long term (current) drug therapy: Secondary | ICD-10-CM | POA: Diagnosis not present

## 2020-04-21 DIAGNOSIS — I110 Hypertensive heart disease with heart failure: Secondary | ICD-10-CM | POA: Diagnosis not present

## 2020-04-21 DIAGNOSIS — Z7982 Long term (current) use of aspirin: Secondary | ICD-10-CM | POA: Insufficient documentation

## 2020-04-21 DIAGNOSIS — J0100 Acute maxillary sinusitis, unspecified: Secondary | ICD-10-CM | POA: Diagnosis not present

## 2020-04-21 DIAGNOSIS — J019 Acute sinusitis, unspecified: Secondary | ICD-10-CM | POA: Diagnosis not present

## 2020-04-21 DIAGNOSIS — I5042 Chronic combined systolic (congestive) and diastolic (congestive) heart failure: Secondary | ICD-10-CM | POA: Insufficient documentation

## 2020-04-21 DIAGNOSIS — R0981 Nasal congestion: Secondary | ICD-10-CM | POA: Diagnosis present

## 2020-04-21 MED ORDER — AMOXICILLIN 500 MG PO CAPS: 500.0000 mg | ORAL_CAPSULE | Freq: Two times a day (BID) | ORAL | 0 refills | Status: AC

## 2020-04-21 MED ORDER — FLUTICASONE PROPIONATE 50 MCG/ACT NA SUSP: 1.0000 | Freq: Every day | NASAL | 2 refills | Status: DC

## 2020-04-21 NOTE — Discharge Instructions (Signed)
Use flonase as directed.  Take antibiotics as directed. Please take all of your antibiotics until finished.  Return to the Emergency Dept for any difficulty breathing, chest pain, vomiting or any other worsening or concerning symptoms.

## 2020-04-21 NOTE — ED Triage Notes (Signed)
Patient c/o nasal congestion and a non productive cough x 2 weeks.

## 2020-04-21 NOTE — ED Provider Notes (Signed)
[facility] DEPT Provider Note   CSN: [id number] Arrival date & time: 04/21/20  1125     History Chief Complaint  Patient presents with  . Nasal Congestion  . Cough    Ayven Glasco Bang is a 70 y.o. female with PMH/o CHF, HTN who presents for evaluation of nasal congestion, sinus pressure, rhinorrhea, cough x 2 weeks. She states that the congestion and rhinorrhea have improved but she has continued to have some cough and sinus pressure. When she coughs, she states it makes her chest sore but otherwise she is not having any chest pain. She has not noted any fevers, SOB. She has been vaccinated for COVID x 3. No COVID exposure. She does not smoke. She does not have any history of asthma.   The history is provided by the patient.       Past Medical History:  Diagnosis Date  . CHF (congestive heart failure) ([place])   . Hypertension     Patient Active Problem List   Diagnosis Date Noted  . Primary osteoarthritis of left knee 04/07/2019  . Body mass index 40.0-44.9, adult ([place]) 04/07/2019  . Morbid obesity ([place]) 04/07/2019  . DJD (degenerative joint disease) of knee 03/24/2019  . Lumbar spondylosis 03/24/2019  . Facet hypertrophy of lumbosacral region 03/24/2019  . Chronic combined systolic (congestive) and diastolic (congestive) heart failure ([place]) 01/07/2019  . Obesity, Class III, BMI 40-49.9 (morbid obesity) ([place]) 01/07/2019  . Suspected COVID-19 virus infection 01/07/2019  . Acute respiratory failure due to COVID-19 ([place]) 01/07/2019  . Anemia 12/03/2014  . CHF exacerbation ([place]) 12/03/2014  . Hypertension 12/03/2014  . Acute on chronic combined systolic and diastolic heart failure ([place])   . Rotator cuff syndrome of right shoulder 04/26/2012  . S/P TAH-BSO 04/26/2012  . Obesity 04/26/2012  . Chronic systolic heart failure ([place]) 10/17/2010  . Hypertensive heart disease 04/16/2000    Past Surgical History:  Procedure Laterality Date  .  COLONOSCOPY    . ROTATOR CUFF REPAIR       OB History   No obstetric history on file.     Family History  Problem Relation Age of Onset  . CAD Mother   . Sickle cell anemia Mother   . CAD Father   . Diabetes type II Father   . Colon cancer Neg Hx   . Esophageal cancer Neg Hx   . Rectal cancer Neg Hx   . Stomach cancer Neg Hx     Social History   Tobacco Use  . Smoking status: Passive Smoke Exposure - Never Smoker  . Smokeless tobacco: Never Used  Vaping Use  . Vaping Use: Never used  Substance Use Topics  . Alcohol use: No  . Drug use: No    Home Medications Prior to Admission medications   Medication Sig Start Date End Date Taking? Authorizing Provider  amoxicillin (AMOXIL) 500 MG capsule Take 1 capsule (500 mg total) by mouth 2 (two) times daily for 5 days. 04/21/20 04/26/20 Yes Volanda Napoleon, PA-C  fluticasone (FLONASE) 50 MCG/ACT nasal spray Place 1 spray into both nostrils daily. 04/21/20  Yes Providence Lanius A, PA-C  amLODipine (NORVASC) 5 MG tablet Take 1 tablet (5 mg total) by mouth daily. 01/07/20 01/06/21  [name], NP  aspirin EC 81 MG tablet Take 1 tablet (81 mg total) by mouth daily. 01/07/20 01/06/21  [name], NP  carvedilol (COREG) 25 MG tablet Take 1 tablet (25 mg total) by mouth 2 (  two) times daily. 09/03/19 09/02/20  [name], NP  furosemide (LASIX) 20 MG tablet Take 1 tablet (20 mg total) by mouth daily. 01/07/20 01/06/21  [name], NP  losartan (COZAAR) 100 MG tablet Take 1 tablet (100 mg total) by mouth daily. 01/07/20 01/06/21  [name], NP  Multiple Vitamin (MULTIVITAMIN WITH MINERALS) TABS tablet Take 1 tablet by mouth daily. 01/13/19   [place]ilford Shi, MD  vitamin B-12 1000 MCG tablet Take 1 tablet (1,000 mcg total) by mouth daily. 12/05/14   Janece Canterbury, MD  Vitamin D3 (VITAMIN D) 25 MCG tablet Take 1 tablet (1,000 Units total) by mouth daily. 01/07/20 01/06/21  [name], NP    Allergies    Patient has no known  allergies.  Review of Systems   Review of Systems  Constitutional: Negative for fever.  HENT: Positive for congestion, rhinorrhea and sinus pressure.   Respiratory: Positive for cough. Negative for shortness of breath.   Cardiovascular: Negative for chest pain.  Gastrointestinal: Negative for nausea and vomiting.  All other systems reviewed and are negative.   Physical Exam Updated Vital Signs BP (!) 142/88 (BP Location: Left Arm)   Pulse 97   Temp 98.9 F (37.2 C) (Oral)   Resp 18   Ht 5\' 6"  (1.676 m)   Wt 106.1 kg   SpO2 94%   BMI 37.77 kg/m   Physical Exam Vitals and nursing note reviewed.  Constitutional:      Appearance: She is well-developed.  HENT:     Head: Normocephalic and atraumatic.     Nose: Congestion present.     Right Sinus: Maxillary sinus tenderness present.     Left Sinus: Maxillary sinus tenderness present.     Comments: Bilateral nasal turbinates are slightly edematous.  Eyes:     General: No scleral icterus.       Right eye: No discharge.        Left eye: No discharge.     Conjunctiva/sclera: Conjunctivae normal.  Pulmonary:     Effort: Pulmonary effort is normal.     Comments: Lungs clear to auscultation bilaterally.  Symmetric chest rise.  No wheezing, rales, rhonchi. Able to speak in full sentences without any difficulty breathing.  Skin:    General: Skin is warm and dry.  Neurological:     Mental Status: She is alert.  Psychiatric:        Speech: Speech normal.        Behavior: Behavior normal.     ED Results / Procedures / Treatments   Labs (all labs ordered are listed, but only abnormal results are displayed) Labs Reviewed - No data to display  EKG None  Radiology No results found.  Procedures Procedures   Medications Ordered in ED Medications - No data to display  ED Course  I have reviewed the triage vital signs and the nursing notes.  Pertinent labs & imaging results that were available during my care of the  patient were reviewed by me and considered in my medical decision making (see chart for details).    MDM Rules/Calculators/A&P                          70 y.o. F who presents for evaluation of 2 weeks of cough, sinus pressure, nasal congestion, rhinorrhea. She reports that rhinorrhea and congestion have improved but she has still had some cough, and sinus pressure. No fevers, SOB. Patient is afebrile,  non-toxic appearing, sitting comfortably on examination table. Vital signs reviewed and stable. On exam, lungs CTAB with no wheezing.  No rales noted concerning for pneumonia.  She does have some tenderness noted to the maxillary sinuses with some nasal turbinate congestion. Her symptoms have been ongoing for about 2 weeks. We discussed the possibility of allergies but she has not had any allergies prior to that.  She does have some sinus pressure as well as some nasal turbinate congestion.  Given that her symptoms been ongoing for 2 weeks, we will plan to treat.  Patient with no known drug allergies.  Reviewed her list of medications.  At this time, patient has no fever, difficulty breathing.  Do not suspect this is COVID-19. At this time, patient exhibits no emergent life-threatening condition that require further evaluation in ED. Patient had ample opportunity for questions and discussion. All patient's questions were answered with full understanding. Strict return precautions discussed. Patient expresses understanding and agreement to plan.   Portions of this note were generated with Lobbyist. Dictation errors may occur despite best attempts at proofreading.   Final Clinical Impression(s) / ED Diagnoses Final diagnoses:  Acute maxillary sinusitis, recurrence not specified    Rx / DC Orders ED Discharge Orders         Ordered    fluticasone (FLONASE) 50 MCG/ACT nasal spray  Daily        04/21/20 1223    amoxicillin (AMOXIL) 500 MG capsule  2 times daily        04/21/20 1223            Desma Mcgregor 04/21/20 1229    Lacretia Leigh, MD 04/26/20 1930

## 2020-04-21 NOTE — ED Notes (Signed)
An After Visit Summary was printed and given to the patient. Discharge instructions given and no further questions at this time.  

## 2020-05-03 ENCOUNTER — Telehealth: Payer: Self-pay | Admitting: Nurse Practitioner

## 2020-05-03 NOTE — Telephone Encounter (Signed)
Pt was called to confirm Dr appointment for 05/02/2020 Pt stated she has a new Dr and to [facility] her appointment she stated that paperwork was to be sent to the office stateing that she has a new PCP

## 2020-05-04 ENCOUNTER — Ambulatory Visit: Payer: Medicare Other | Admitting: Nurse Practitioner

## 2020-05-20 ENCOUNTER — Other Ambulatory Visit: Payer: Self-pay | Admitting: *Deleted

## 2020-05-20 NOTE — Patient Outreach (Signed)
[facility]) Care Management  05/20/2020  [name] [facility] 02/18/1950 [phone]  Nurse opened chart to call patient for [facility] screening. Upon reviewing the patient to call, nurse saw that the patient currently does not have a PCP  and her insurance is [facility] Medicare. Having no PCP does make her ineligible for [facility] benefit.  [name] RN, [place] [phone] [name]

## 2020-06-02 ENCOUNTER — Other Ambulatory Visit: Payer: Self-pay | Admitting: Nurse Practitioner

## 2020-06-02 MED ORDER — CARVEDILOL 25 MG PO TABS: 25.0000 mg | ORAL_TABLET | Freq: Two times a day (BID) | ORAL | 11 refills | Status: AC

## 2020-06-28 ENCOUNTER — Ambulatory Visit (INDEPENDENT_AMBULATORY_CARE_PROVIDER_SITE_OTHER): Payer: Medicare Other | Admitting: Nurse Practitioner

## 2020-06-28 VITALS — BP 117/64 | HR 78 | Temp 97.9°F | Resp 18 | Ht 66.0 in | Wt 246.0 lb

## 2020-06-28 DIAGNOSIS — Z0000 Encounter for general adult medical examination without abnormal findings: Secondary | ICD-10-CM

## 2020-06-28 NOTE — Progress Notes (Signed)
Subjective:   Yakelin Grenier is a 70 y.o. female who presents for Medicare Annual (Subsequent) preventive examination.  Review of Systems    Review of Systems  Constitutional: Negative.   HENT: Negative.    Eyes: Negative.   Respiratory: Negative.    Cardiovascular: Negative.   Gastrointestinal: Negative.   Genitourinary: Negative.   Musculoskeletal: Negative.   Skin: Negative.   Neurological: Negative.   Endo/Heme/Allergies: Negative.   Psychiatric/Behavioral: Negative.     Cardiac Risk Factors include: advanced age (>57men, >95 women);obesity (BMI >30kg/m2);sedentary lifestyle     Objective:    Today's Vitals   06/28/20 1349  BP: 117/64  Pulse: 78  Resp: 18  Temp: 97.9 F (36.6 C)  SpO2: 98%  Weight: 246 lb (111.6 kg)  Height: 5\' 6"  (1.676 m)   Body mass index is 39.71 kg/m.  Advanced Directives 06/28/2020 04/21/2020 12/02/2019 01/08/2019 02/23/2016 01/06/2015 12/03/2014  Does Patient Have a Medical Advance Directive? No No No No No No No  Would patient like information on creating a medical advance directive? No - Patient declined No - Patient declined No - Patient declined No - Patient declined No - Patient declined No - patient declined information No - patient declined information    Current Medications (verified) Outpatient Encounter Medications as of 06/28/2020  Medication Sig   amLODipine (NORVASC) 5 MG tablet Take 1 tablet (5 mg total) by mouth daily.   aspirin EC 81 MG tablet Take 1 tablet (81 mg total) by mouth daily.   carvedilol (COREG) 25 MG tablet Take 1 tablet (25 mg total) by mouth 2 (two) times daily.   fluticasone (FLONASE) 50 MCG/ACT nasal spray Place 1 spray into both nostrils daily.   furosemide (LASIX) 20 MG tablet Take 1 tablet (20 mg total) by mouth daily.   losartan (COZAAR) 100 MG tablet Take 1 tablet (100 mg total) by mouth daily.   Multiple Vitamin (MULTIVITAMIN WITH MINERALS) TABS tablet Take 1 tablet by mouth daily.   vitamin B-12 1000  MCG tablet Take 1 tablet (1,000 mcg total) by mouth daily.   Vitamin D3 (VITAMIN D) 25 MCG tablet Take 1 tablet (1,000 Units total) by mouth daily.   No facility-administered encounter medications on file as of 06/28/2020.    Allergies (verified) Patient has no known allergies.   History: Past Medical History:  Diagnosis Date   CHF (congestive heart failure) (HCC)    Hypertension    Past Surgical History:  Procedure Laterality Date   COLONOSCOPY     ROTATOR CUFF REPAIR     Family History  Problem Relation Age of Onset   CAD Mother    Sickle cell anemia Mother    CAD Father    Diabetes type II Father    Colon cancer Neg Hx    Esophageal cancer Neg Hx    Rectal cancer Neg Hx    Stomach cancer Neg Hx    Social History   Socioeconomic History   Marital status: Married    Spouse name: Not on file   Number of children: Not on file   Years of education: Not on file   Highest education level: Not on file  Occupational History   Not on file  Tobacco Use   Smoking status: Never    Passive exposure: Yes   Smokeless tobacco: Never  Vaping Use   Vaping Use: Never used  Substance and Sexual Activity   Alcohol use: No   Drug use: No   Sexual  activity: Not on file  Other Topics Concern   Not on file  Social History Narrative   Not on file   Social Determinants of Health   Financial Resource Strain: High Risk   Difficulty of Paying Living Expenses: Hard  Food Insecurity: No Food Insecurity   Worried About Running Out of Food in the Last Year: Never true   Ran Out of Food in the Last Year: Never true  Transportation Needs: No Transportation Needs   Lack of Transportation (Medical): No   Lack of Transportation (Non-Medical): No  Physical Activity: Inactive   Days of Exercise per Week: 0 days   Minutes of Exercise per Session: 0 min  Stress: No Stress Concern Present   Feeling of Stress : Not at all  Social Connections: Moderately Integrated   Frequency of  Communication with Friends and Family: More than three times a week   Frequency of Social Gatherings with Friends and Family: More than three times a week   Attends Religious Services: More than 4 times per year   Active Member of [employer] or Organizations: Yes   Attends Music therapist: More than 4 times per year   Marital Status: Separated    Tobacco Counseling Counseling given: Yes   Clinical Intake:    Diabetic? no   Activities of Daily Living In your present state of health, do you have any difficulty performing the following activities: 06/28/2020  Hearing? N  Vision? N  Difficulty concentrating or making decisions? N  Walking or climbing stairs? N  Dressing or bathing? N  Doing errands, shopping? N  Preparing Food and eating ? N  Using the Toilet? N  In the past six months, have you accidently leaked urine? N  Do you have problems with loss of bowel control? N  Managing your Medications? N  Managing your Finances? N  Housekeeping or managing your Housekeeping? N  Some recent data might be hidden    Patient Care Team: Pcp, No as PCP - General  Indicate any recent Medical Services you may have received from other than [facility] providers in the past year (date may be approximate).     Assessment:   This is a routine wellness examination for Jacqualyn.   Dietary issues and exercise activities discussed: Current Exercise Habits: The patient does not participate in regular exercise at present, Exercise limited by: orthopedic condition(s)   Goals Addressed             This Visit's Progress    DIET - REDUCE FAT INTAKE         Depression Screen PHQ 2/9 Scores 06/28/2020 11/05/2019 03/23/2019 01/21/2019  PHQ - 2 Score 0 0 0 0    Fall Risk Fall Risk  06/28/2020 11/05/2019 03/23/2019 01/21/2019  Falls in the past year? 0 1 0 0  Number falls in past yr: 0 1 - -  Injury with Fall? 0 0 - -  Risk for fall due to : No Fall Risks - - -  Follow up Education  provided - - -    FALL RISK PREVENTION PERTAINING TO THE HOME:  Any stairs in or around the home? Yes  If so, are there any without handrails? Yes  Home free of loose throw rugs in walkways, pet beds, electrical cords, etc? Yes  Adequate lighting in your home to reduce risk of falls? Yes   ASSISTIVE DEVICES UTILIZED TO PREVENT FALLS:  Life alert? Yes  Use of a cane, walker or w/c?  Yes  Grab bars in the bathroom? No  Shower chair or bench in shower? No  Elevated toilet seat or a handicapped toilet? No   TIMED UP AND GO:  Was the test performed? Yes .  Length of time to ambulate 10 feet: 3 sec.   Gait steady and fast without use of assistive device  Cognitive Function: MMSE - Mini Mental State Exam 06/28/2020  Orientation to time 5  Orientation to Place 5  Registration 3  Attention/ Calculation 5  Recall 3  Language- name 2 objects 2  Language- repeat 1  Language- follow 3 step command 3  Language- read & follow direction 1  Write a sentence 1  Copy design 1  Total score 30        Immunizations Immunization History  Administered Date(s) Administered   Influenza,inj,Quad PF,6+ Mos 12/05/2014   Influenza-Unspecified 10/16/2019   PFIZER(Purple Top)SARS-COV-2 Vaccination 03/13/2019, 10/29/2019   Pneumococcal Polysaccharide-23 12/05/2014, 01/10/2019    TDAP status: Due, Education has been provided regarding the importance of this vaccine. Advised may receive this vaccine at local pharmacy or Health Dept. Aware to provide a copy of the vaccination record if obtained from local pharmacy or Health Dept. Verbalized acceptance and understanding.  Flu Vaccine status: Up to date  Pneumococcal vaccine status: Due, Education has been provided regarding the importance of this vaccine. Advised may receive this vaccine at local pharmacy or Health Dept. Aware to provide a copy of the vaccination record if obtained from local pharmacy or Health Dept. Verbalized acceptance and  understanding.  Covid-19 vaccine status: Completed vaccines  Qualifies for Shingles Vaccine? No   Zostavax completed No   Shingrix Completed?: No.    Education has been provided regarding the importance of this vaccine. Patient has been advised to call insurance company to determine out of pocket expense if they have not yet received this vaccine. Advised may also receive vaccine at local pharmacy or Health Dept. Verbalized acceptance and understanding.  Screening Tests Health Maintenance  Topic Date Due   Zoster Vaccines- Shingrix (1 of 2) Never done   COVID-19 Vaccine (3 - Booster for Pfizer series) 03/28/2020   TETANUS/TDAP  11/05/2020 (Originally 12/19/1969)   PNA vac Low Risk Adult (2 of 2 - PCV13) 05/04/2021 (Originally 01/10/2020)   INFLUENZA VACCINE  08/01/2020   MAMMOGRAM  12/29/2021   COLONOSCOPY (Pts 45-35yrs Insurance coverage will need to be confirmed)  05/14/2029   DEXA SCAN  Completed   Hepatitis C Screening  Completed   HPV VACCINES  Aged Out    Health Maintenance  Health Maintenance Due  Topic Date Due   Zoster Vaccines- Shingrix (1 of 2) Never done   COVID-19 Vaccine (3 - Booster for Pfizer series) 03/28/2020    Colorectal cancer screening: Type of screening: Colonoscopy. Completed 2021. Repeat every 10 years  Mammogram status: Completed 2021. Repeat every year  Bone Density status: Completed  Lung Cancer Screening: (Low Dose CT Chest recommended if Age 30-80 years, 30 pack-year currently smoking OR have quit w/in 15years.) does not qualify.   Lung Cancer Screening Referral: NA  Additional Screening:  Hepatitis C Screening: does qualify; Completed   Vision Screening: Recommended annual ophthalmology exams for early detection of glaucoma and other disorders of the eye. Is the patient up to date with their annual eye exam?  No  Who is the provider or what is the name of the office in which the patient attends annual eye exams? NA If pt is not established  with a provider, would they like to be referred to a provider to establish care?  na .   Dental Screening: Recommended annual dental exams for proper oral hygiene  Community Resource Referral / Chronic Care Management: CRR required this visit?  No   CCM required this visit?  No      Plan:     I have personally reviewed and noted the following in the patient's chart:   Medical and social history Use of alcohol, tobacco or illicit drugs  Current medications and supplements including opioid prescriptions.  Functional ability and status Nutritional status Physical activity Advanced directives List of other physicians Hospitalizations, surgeries, and ER visits in previous 12 months Vitals Screenings to include cognitive, depression, and falls Referrals and appointments  In addition, I have reviewed and discussed with patient certain preventive protocols, quality metrics, and best practice recommendations. A written personalized care plan for preventive services as well as general preventive health recommendations were provided to patient.     Fenton Foy, NP   06/28/2020

## 2020-06-28 NOTE — Patient Instructions (Addendum)
Deborah Holmes , Thank you for taking time to come for your Medicare Wellness Visit. I appreciate your ongoing commitment to your health goals. Please review the following plan we discussed and let me know if I can assist you in the future.   These are the goals we discussed:  Goals      DIET - REDUCE FAT INTAKE        This is a list of the screening recommended for you and due dates:  Health Maintenance  Topic Date Due   Zoster (Shingles) Vaccine (1 of 2) Never done   COVID-19 Vaccine (3 - Booster for Pfizer series) 03/28/2020   Tetanus Vaccine  11/05/2020*   Pneumonia vaccines (2 of 2 - PCV13) 05/04/2021*   Flu Shot  08/01/2020   Mammogram  12/29/2021   Colon Cancer Screening  05/14/2029   DEXA scan (bone density measurement)  Completed   Hepatitis C Screening: USPSTF Recommendation to screen - Ages 58-79 yo.  Completed   HPV Vaccine  Aged Out  *Topic was postponed. The date shown is not the original due date.    Fat and Cholesterol Restricted Eating Plan Getting too much fat and cholesterol in your diet may cause health problems. Choosing the right foods helps keep your fat and cholesterol at normal levels.This can keep you from getting certain diseases. Your doctor may recommend an eating plan that includes: Total fat: ______% or less of total calories a day. Saturated fat: ______% or less of total calories a day. Cholesterol: less than _________mg a day. Fiber: ______g a day. What are tips for following this plan? Meal planning At meals, divide your plate into four equal parts: Fill one-half of your plate with vegetables and green salads. Fill one-fourth of your plate with whole grains. Fill one-fourth of your plate with low-fat (lean) protein foods. Eat fish that is high in omega-3 fats at least two times a week. This includes mackerel, tuna, sardines, and salmon. Eat foods that are high in fiber, such as whole grains, beans, apples, broccoli, carrots, peas, and  barley. General tips  Work with your doctor to lose weight if you need to. Avoid: Foods with added sugar. Fried foods. Foods with partially hydrogenated oils. Limit alcohol intake to no more than 1 drink a day for nonpregnant women and 2 drinks a day for men. One drink equals 12 oz of beer, 5 oz of wine, or 1 oz of hard liquor.  Reading food labels Check food labels for: Trans fats. Partially hydrogenated oils. Saturated fat (g) in each serving. Cholesterol (mg) in each serving. Fiber (g) in each serving. Choose foods with healthy fats, such as: Monounsaturated fats. Polyunsaturated fats. Omega-3 fats. Choose grain products that have whole grains. Look for the word "whole" as the first word in the ingredient list. Cooking Cook foods using low-fat methods. These include baking, boiling, grilling, and broiling. Eat more home-cooked foods. Eat at restaurants and buffets less often. Avoid cooking using saturated fats, such as butter, cream, palm oil, palm kernel oil, and coconut oil. Recommended foods  Fruits All fresh, canned (in natural juice), or frozen fruits. Vegetables Fresh or frozen vegetables (raw, steamed, roasted, or grilled). Green salads. Grains Whole grains, such as whole wheat or whole grain breads, crackers, cereals, and pasta. Unsweetened oatmeal, bulgur, barley, quinoa, or brown rice. Corn or whole wheat flour tortillas. Meats and other protein foods Ground beef (85% or leaner), grass-fed beef, or beef trimmed of fat. Skinless chicken or [place]. Ground chicken  or [place]. Pork trimmed of fat. All fish and seafood. Egg whites. Dried beans, peas, or lentils. Unsalted nuts or seeds. Unsalted canned beans. Nut butters without added sugar or oil. Dairy Low-fat or nonfat dairy products, such as skim or 1% milk, 2% or reduced-fat cheeses, low-fat and fat-free ricotta or cottage cheese, or plain low-fat and nonfat yogurt. Fats and oils Tub margarine without trans fats.  Light or reduced-fat mayonnaise and salad dressings. Avocado. Olive, canola, sesame, or safflower oils. The items listed above may not be a complete list of foods and beverages youcan eat. Contact a dietitian for more information. Foods to avoid Fruits Canned fruit in heavy syrup. Fruit in cream or butter sauce. Fried fruit. Vegetables Vegetables cooked in cheese, cream, or butter sauce. Fried vegetables. Grains White bread. White pasta. White rice. Cornbread. Bagels, pastries, and croissants. Crackers and snack foods that contain trans fat and hydrogenated oils. Meats and other protein foods Fatty cuts of meat. Ribs, chicken wings, bacon, sausage, bologna, salami, chitterlings, fatback, hot dogs, bratwurst, and packaged lunch meats. Liver and organ meats. Whole eggs and egg yolks. Chicken and [place] with skin. Fried meat. Dairy Whole or 2% milk, cream, half-and-half, and cream cheese. Whole milk cheeses. Whole-fat or sweetened yogurt. Full-fat cheeses. Nondairy creamers and whipped toppings. Processed cheese, cheese spreads, and cheese curds. Beverages Alcohol. Sugar-sweetened drinks such as sodas, lemonade, and fruit drinks. Fats and oils Butter, stick margarine, lard, shortening, ghee, or bacon fat. Coconut, palm kernel, and palm oils. Sweets and desserts Corn syrup, sugars, honey, and molasses. Candy. Jam and jelly. Syrup. Sweetened cereals. Cookies, pies, cakes, donuts, muffins, and ice cream. The items listed above may not be a complete list of foods and beverages youshould avoid. Contact a dietitian for more information. Summary Choosing the right foods helps keep your fat and cholesterol at normal levels. This can keep you from getting certain diseases. At meals, fill one-half of your plate with vegetables and green salads. Eat high-fiber foods, like whole grains, beans, apples, carrots, peas, and barley. Limit added sugar, saturated fats, alcohol, and fried foods. This information  is not intended to replace advice given to you by your health care provider. Make sure you discuss any questions you have with your healthcare provider. Document Revised: 04/22/2019 Document Reviewed: 04/22/2019 Elsevier Patient Education  2022 [place] Prevention in the Home, Adult Falls can cause injuries and can happen to people of all ages. There are many things you can do to make your home safe and to help prevent falls. Ask forhelp when making these changes. What actions can I take to prevent falls? General Instructions Use good lighting in all rooms. Replace any light bulbs that burn out. Turn on the lights in dark areas. Use night-lights. Keep items that you use often in easy-to-reach places. Lower the shelves around your home if needed. Set up your furniture so you have a clear path. Avoid moving your furniture around. Do not have throw rugs or other things on the floor that can make you trip. Avoid walking on wet floors. If any of your floors are uneven, fix them. Add color or contrast paint or tape to clearly mark and help you see: Grab bars or handrails. First and last steps of staircases. Where the edge of each step is. If you use a stepladder: Make sure that it is fully opened. Do not climb a closed stepladder. Make sure the sides of the stepladder are locked in place. Ask someone to hold  the stepladder while you use it. Know where your pets are when moving through your home. What can I do in the bathroom?     Keep the floor dry. Clean up any water on the floor right away. Remove soap buildup in the tub or shower. Use nonskid mats or decals on the floor of the tub or shower. Attach bath mats securely with double-sided, nonslip rug tape. If you need to sit down in the shower, use a plastic, nonslip stool. Install grab bars by the toilet and in the tub and shower. Do not use towel bars as grab bars. What can I do in the bedroom? Make sure that you have a  light by your bed that is easy to reach. Do not use any sheets or blankets for your bed that hang to the floor. Have a firm chair with side arms that you can use for support when you get dressed. What can I do in the kitchen? Clean up any spills right away. If you need to reach something above you, use a step stool with a grab bar. Keep electrical cords out of the way. Do not use floor polish or wax that makes floors slippery. What can I do with my stairs? Do not leave any items on the stairs. Make sure that you have a light switch at the top and the bottom of the stairs. Make sure that there are handrails on both sides of the stairs. Fix handrails that are broken or loose. Install nonslip stair treads on all your stairs. Avoid having throw rugs at the top or bottom of the stairs. Choose a carpet that does not hide the edge of the steps on the stairs. Check carpeting to make sure that it is firmly attached to the stairs. Fix carpet that is loose or worn. What can I do on the outside of my home? Use bright outdoor lighting. Fix the edges of walkways and driveways and fix any cracks. Remove anything that might make you trip as you walk through a door, such as a raised step or threshold. Trim any bushes or trees on paths to your home. Check to see if handrails are loose or broken and that both sides of all steps have handrails. Install guardrails along the edges of any raised decks and porches. Clear paths of anything that can make you trip, such as tools or rocks. Have leaves, snow, or ice cleared regularly. Use sand or salt on paths during winter. Clean up any spills in your garage right away. This includes grease or oil spills. What other actions can I take? Wear shoes that: Have a low heel. Do not wear high heels. Have rubber bottoms. Feel good on your feet and fit well. Are closed at the toe. Do not wear open-toe sandals. Use tools that help you move around if needed. These  include: Canes. Walkers. Scooters. Crutches. Review your medicines with your doctor. Some medicines can make you feel dizzy. This can increase your chance of falling. Ask your doctor what else you can do to help prevent falls. Where to find more information Centers for Disease Control and Prevention, [employer]: http://www.wolf.info/ National Institute on Aging: http://kim-miller.com/ Contact a doctor if: You are afraid of falling at home. You feel weak, drowsy, or dizzy at home. You fall at home. Summary There are many simple things that you can do to make your home safe and to help prevent falls. Ways to make your home safe include removing things that can make  you trip and installing grab bars in the bathroom. Ask for help when making these changes in your home. This information is not intended to replace advice given to you by your health care provider. Make sure you discuss any questions you have with your healthcare provider. Document Revised: 07/22/2019 Document Reviewed: 07/22/2019 Elsevier Patient Education  [place] Maintenance, Female Adopting a healthy lifestyle and getting preventive care are important in promoting health and wellness. Ask your health care provider about: The right schedule for you to have regular tests and exams. Things you can do on your own to prevent diseases and keep yourself healthy. What should I know about diet, weight, and exercise? Eat a healthy diet  Eat a diet that includes plenty of vegetables, fruits, low-fat dairy products, and lean protein. Do not eat a lot of foods that are high in solid fats, added sugars, or sodium.  Maintain a healthy weight Body mass index (BMI) is used to identify weight problems. It estimates body fat based on height and weight. Your health care provider can help determineyour BMI and help you achieve or maintain a healthy weight. Get regular exercise Get regular exercise. This is one of the most important things  you can do for your health. Most adults should: Exercise for at least 150 minutes each week. The exercise should increase your heart rate and make you sweat (moderate-intensity exercise). Do strengthening exercises at least twice a week. This is in addition to the moderate-intensity exercise. Spend less time sitting. Even light physical activity can be beneficial. Watch cholesterol and blood lipids Have your blood tested for lipids and cholesterol at 70 years of age, then havethis test every 5 years. Have your cholesterol levels checked more often if: Your lipid or cholesterol levels are high. You are older than 70 years of age. You are at high risk for heart disease. What should I know about cancer screening? Depending on your health history and family history, you may need to have cancer screening at various ages. This may include screening for: Breast cancer. Cervical cancer. Colorectal cancer. Skin cancer. Lung cancer. What should I know about heart disease, diabetes, and high blood pressure? Blood pressure and heart disease High blood pressure causes heart disease and increases the risk of stroke. This is more likely to develop in people who have high blood pressure readings, are of African descent, or are overweight. Have your blood pressure checked: Every 3-5 years if you are 30-53 years of age. Every year if you are 69 years old or older. Diabetes Have regular diabetes screenings. This checks your fasting blood sugar level. Have the screening done: Once every three years after age 55 if you are at a normal weight and have a low risk for diabetes. More often and at a younger age if you are overweight or have a high risk for diabetes. What should I know about preventing infection? Hepatitis B If you have a higher risk for hepatitis B, you should be screened for this virus. Talk with your health care provider to find out if you are at risk forhepatitis B infection. Hepatitis  C Testing is recommended for: Everyone born from [birth year] through 1965. Anyone with known risk factors for hepatitis C. Sexually transmitted infections (STIs) Get screened for STIs, including gonorrhea and chlamydia, if: You are sexually active and are younger than 70 years of age. You are older than 70 years of age and your health care provider tells you that you are  at risk for this type of infection. Your sexual activity has changed since you were last screened, and you are at increased risk for chlamydia or gonorrhea. Ask your health care provider if you are at risk. Ask your health care provider about whether you are at high risk for HIV. Your health care provider may recommend a prescription medicine to help prevent HIV infection. If you choose to take medicine to prevent HIV, you should first get tested for HIV. You should then be tested every 3 months for as long as you are taking the medicine. Pregnancy If you are about to stop having your period (premenopausal) and you may become pregnant, seek counseling before you get pregnant. Take 400 to 800 micrograms (mcg) of folic acid every day if you become pregnant. Ask for birth control (contraception) if you want to prevent pregnancy. Osteoporosis and menopause Osteoporosis is a disease in which the bones lose minerals and strength with aging. This can result in bone fractures. If you are 67 years old or older, or if you are at risk for osteoporosis and fractures, ask your health care provider if you should: Be screened for bone loss. Take a calcium or vitamin D supplement to lower your risk of fractures. Be given hormone replacement therapy (HRT) to treat symptoms of menopause. Follow these instructions at home: Lifestyle Do not use any products that contain nicotine or tobacco, such as cigarettes, e-cigarettes, and chewing tobacco. If you need help quitting, ask your health care provider. Do not use street drugs. Do not share needles. Ask  your health care provider for help if you need support or information about quitting drugs. Alcohol use Do not drink alcohol if: Your health care provider tells you not to drink. You are pregnant, may be pregnant, or are planning to become pregnant. If you drink alcohol: Limit how much you use to 0-1 drink a day. Limit intake if you are breastfeeding. Be aware of how much alcohol is in your drink. In the [place] one drink equals one 12 oz bottle of beer (355 mL), one 5 oz glass of wine (148 mL), or one 1 oz glass of hard liquor (44 mL). General instructions Schedule regular health, dental, and eye exams. Stay current with your vaccines. Tell your health care provider if: You often feel depressed. You have ever been abused or do not feel safe at home. Summary Adopting a healthy lifestyle and getting preventive care are important in promoting health and wellness. Follow your health care provider's instructions about healthy diet, exercising, and getting tested or screened for diseases. Follow your health care provider's instructions on monitoring your cholesterol and blood pressure. This information is not intended to replace advice given to you by your health care provider. Make sure you discuss any questions you have with your healthcare provider. Document Revised: 12/11/2017 Document Reviewed: 12/11/2017 Elsevier Patient Education  2022 [employer].

## 2020-10-25 ENCOUNTER — Other Ambulatory Visit: Payer: Self-pay | Admitting: Registered Nurse

## 2020-10-25 DIAGNOSIS — R911 Solitary pulmonary nodule: Secondary | ICD-10-CM

## 2020-11-10 ENCOUNTER — Ambulatory Visit
Admission: RE | Admit: 2020-11-10 | Discharge: 2020-11-10 | Disposition: A | Discharge status: Home / Self Care | Payer: Medicare Other | Source: Ambulatory Visit | Attending: Registered Nurse | Admitting: Registered Nurse

## 2020-11-10 ENCOUNTER — Other Ambulatory Visit: Payer: Self-pay

## 2020-11-10 DIAGNOSIS — R911 Solitary pulmonary nodule: Secondary | ICD-10-CM

## 2021-05-09 ENCOUNTER — Other Ambulatory Visit: Payer: Self-pay | Admitting: Registered Nurse

## 2021-05-09 ENCOUNTER — Ambulatory Visit
Admission: RE | Admit: 2021-05-09 | Discharge: 2021-05-09 | Disposition: A | Discharge status: Home / Self Care | Payer: Medicare Other | Source: Ambulatory Visit | Attending: Registered Nurse | Admitting: Registered Nurse

## 2021-05-09 DIAGNOSIS — M5489 Other dorsalgia: Secondary | ICD-10-CM

## 2021-05-09 DIAGNOSIS — M170 Bilateral primary osteoarthritis of knee: Secondary | ICD-10-CM

## 2021-08-24 ENCOUNTER — Encounter (HOSPITAL_COMMUNITY): Payer: Self-pay | Admitting: Emergency Medicine

## 2021-08-24 ENCOUNTER — Ambulatory Visit (INDEPENDENT_AMBULATORY_CARE_PROVIDER_SITE_OTHER): Payer: Medicare Other

## 2021-08-24 ENCOUNTER — Ambulatory Visit (HOSPITAL_COMMUNITY)
Admission: EM | Admit: 2021-08-24 | Discharge: 2021-08-24 | Disposition: A | Discharge status: Home / Self Care | Payer: Medicare Other

## 2021-08-24 DIAGNOSIS — J309 Allergic rhinitis, unspecified: Secondary | ICD-10-CM

## 2021-08-24 DIAGNOSIS — R051 Acute cough: Secondary | ICD-10-CM

## 2021-08-24 DIAGNOSIS — R059 Cough, unspecified: Secondary | ICD-10-CM

## 2021-08-24 MED ORDER — BENZONATATE 100 MG PO CAPS: 100.0000 mg | ORAL_CAPSULE | Freq: Three times a day (TID) | ORAL | 0 refills | Status: AC

## 2021-08-24 MED ORDER — FLUTICASONE PROPIONATE 50 MCG/ACT NA SUSP: 1.0000 | Freq: Every day | NASAL | 2 refills | Status: AC

## 2021-08-24 MED ORDER — CETIRIZINE HCL 10 MG PO TABS: 10.0000 mg | ORAL_TABLET | Freq: Every day | ORAL | 2 refills | Status: AC

## 2021-08-24 NOTE — Discharge Instructions (Signed)
Chest x-ray and physical exam looks great today.   Take cetirizine once daily to dry up your nasal mucous to help with your post-nasal drainage causing your cough.   Tessalon pearles every 8 hours as needed for cough.  Flonase 1 spray into nostril once daily to reduce inflammation in your nose.   If you develop any new or worsening symptoms or do not improve in the next 2 to 3 days, please return.  If your symptoms are severe, please go to the emergency room.  Follow-up with your primary care provider for further evaluation and management of your symptoms as well as ongoing wellness visits.  I hope you feel better!

## 2021-08-24 NOTE — ED Triage Notes (Signed)
Patient c/o productive cough x 3 weeks.   Patient denies fever or SOB.   Patient endorses chest soreness from coughing. Patient endorses increased coughing at night.   Patient has history of Pneumonia.   Patient was seen by PCP and given " some respiratory pills" with no relief of symptoms.

## 2021-08-27 NOTE — ED Provider Notes (Signed)
[place]    CSN: [id number] Arrival date & time: 08/24/21  1930      History   Chief Complaint Chief Complaint  Patient presents with   Cough    HPI Deborah Holmes is a 71 y.o. female.   Patient presents to urgent care for evaluation of ongoing cough for the last 3 weeks that she states is dry and nagging. States   Cough   Past Medical History:  Diagnosis Date   CHF (congestive heart failure) ([place])    Hypertension     Patient Active Problem List   Diagnosis Date Noted   Primary osteoarthritis of left knee 04/07/2019   Body mass index 40.0-44.9, adult ([place]) 04/07/2019   Morbid obesity ([place]) 04/07/2019   DJD (degenerative joint disease) of knee 03/24/2019   Lumbar spondylosis 03/24/2019   Facet hypertrophy of lumbosacral region 03/24/2019   Chronic combined systolic (congestive) and diastolic (congestive) heart failure ([place]) 01/07/2019   Obesity, Class III, BMI 40-49.9 (morbid obesity) ([place]) 01/07/2019   Suspected COVID-19 virus infection 01/07/2019   Acute respiratory failure due to COVID-19 ([place]) 01/07/2019   Anemia 12/03/2014   CHF exacerbation ([place]) 12/03/2014   Hypertension 12/03/2014   Acute on chronic combined systolic and diastolic heart failure ([facility])    Rotator cuff syndrome of right shoulder 04/26/2012   S/P TAH-BSO 04/26/2012   Obesity 99/24/2683   Chronic systolic heart failure ([place]) 10/17/2010   Hypertensive heart disease 04/16/2000    Past Surgical History:  Procedure Laterality Date   COLONOSCOPY     ROTATOR CUFF REPAIR      OB History   No obstetric history on file.      Home Medications    Prior to Admission medications   Medication Sig Start Date End Date Taking? Authorizing Provider  amLODipine (NORVASC) 5 MG tablet Take 1 tablet (5 mg total) by mouth daily. 01/07/20 08/24/21 Yes King, Diona Foley, NP  benzonatate (TESSALON) 100 MG capsule Take 1 capsule (100 mg total) by mouth every 8 (eight) hours. 08/24/21  Yes  Talbot Grumbling, FNP  carvedilol (COREG) 25 MG tablet Take 1 tablet (25 mg total) by mouth 2 (two) times daily. 06/02/20 08/24/21 Yes [name], NP  celecoxib (CELEBREX) 100 MG capsule Take 100 mg by mouth 2 (two) times daily.   Yes [provider]  cetirizine (ZYRTEC) 10 MG tablet Take 1 tablet (10 mg total) by mouth daily. 08/24/21  Yes Talbot Grumbling, FNP  fluticasone (FLONASE) 50 MCG/ACT nasal spray Place 1 spray into both nostrils daily. 08/24/21  Yes Talbot Grumbling, FNP  furosemide (LASIX) 20 MG tablet Take 1 tablet (20 mg total) by mouth daily. 01/07/20 08/24/21 Yes King, Diona Foley, NP  losartan (COZAAR) 100 MG tablet Take 1 tablet (100 mg total) by mouth daily. 01/07/20 08/24/21 Yes [name], NP  Multiple Vitamin (MULTIVITAMIN WITH MINERALS) TABS tablet Take 1 tablet by mouth daily. 01/13/19  Yes [place] Shi, MD  vitamin B-12 1000 MCG tablet Take 1 tablet (1,000 mcg total) by mouth daily. 12/05/14  Yes Short, Noah Delaine, MD    Family History Family History  Problem Relation Age of Onset   CAD Mother    Sickle cell anemia Mother    CAD Father    Diabetes type II Father    Colon cancer Neg Hx    Esophageal cancer Neg Hx    Rectal cancer Neg Hx    Stomach cancer Neg Hx  Social History Social History   Tobacco Use   Smoking status: Never    Passive exposure: Yes   Smokeless tobacco: Never  Vaping Use   Vaping Use: Never used  Substance Use Topics   Alcohol use: No   Drug use: No     Allergies   Patient has no known allergies.   Review of Systems Review of Systems  Respiratory:  Positive for cough.      Physical Exam Triage Vital Signs ED Triage Vitals  Enc Vitals Group     BP 08/24/21 2026 (!) 144/79     Pulse Rate 08/24/21 2026 83     Resp 08/24/21 2026 18     Temp 08/24/21 2026 98 F (36.7 C)     Temp Source 08/24/21 2026 Oral     SpO2 08/24/21 2026 97 %     Weight --      Height --      Head Circumference --       Peak Flow --      Pain Score 08/24/21 2030 10     Pain Loc --      Pain Edu? --      Excl. in [place]? --    No data found.  Updated Vital Signs BP (!) 144/79 (BP Location: Right Arm)   Pulse 83   Temp 98 F (36.7 C) (Oral)   Resp 18   SpO2 97%   Visual Acuity Right Eye Distance:   Left Eye Distance:   Bilateral Distance:    Right Eye Near:   Left Eye Near:    Bilateral Near:     Physical Exam   UC Treatments / Results  Labs (all labs ordered are listed, but only abnormal results are displayed) Labs Reviewed - No data to display  EKG   Radiology No results found.  Procedures Procedures (including critical care time)  Medications Ordered in UC Medications - No data to display  Initial Impression / Assessment and Plan / UC Course  I have reviewed the triage vital signs and the nursing notes.  Pertinent labs & imaging results that were available during my care of the patient were reviewed by me and considered in my medical decision making (see chart for details).     *** Final Clinical Impressions(s) / UC Diagnoses   Final diagnoses:  Acute cough  Allergic rhinitis, unspecified seasonality, unspecified trigger     Discharge Instructions      Chest x-ray and physical exam looks great today.   Take cetirizine once daily to dry up your nasal mucous to help with your post-nasal drainage causing your cough.   Tessalon pearles every 8 hours as needed for cough.  Flonase 1 spray into nostril once daily to reduce inflammation in your nose.   If you develop any new or worsening symptoms or do not improve in the next 2 to 3 days, please return.  If your symptoms are severe, please go to the emergency room.  Follow-up with your primary care provider for further evaluation and management of your symptoms as well as ongoing wellness visits.  I hope you feel better!    ED Prescriptions     Medication Sig Dispense Auth. Provider   cetirizine (ZYRTEC) 10 MG  tablet Take 1 tablet (10 mg total) by mouth daily. 30 tablet Joella Prince M, FNP   fluticasone [facility]) 50 MCG/ACT nasal spray Place 1 spray into both nostrils daily. 16 g Talbot Grumbling, FNP  benzonatate (TESSALON) 100 MG capsule Take 1 capsule (100 mg total) by mouth every 8 (eight) hours. 21 capsule Talbot Grumbling, FNP      PDMP not reviewed this encounter.

## 2021-10-26 ENCOUNTER — Ambulatory Visit
Admission: RE | Admit: 2021-10-26 | Discharge: 2021-10-26 | Disposition: A | Discharge status: Home / Self Care | Payer: Medicare Other | Source: Ambulatory Visit | Attending: Internal Medicine | Admitting: Internal Medicine

## 2021-10-26 ENCOUNTER — Other Ambulatory Visit: Payer: Self-pay | Admitting: Internal Medicine

## 2021-10-26 DIAGNOSIS — R52 Pain, unspecified: Secondary | ICD-10-CM

## 2022-01-30 ENCOUNTER — Other Ambulatory Visit: Payer: Self-pay | Admitting: Registered Nurse

## 2022-01-30 DIAGNOSIS — R911 Solitary pulmonary nodule: Secondary | ICD-10-CM

## 2022-02-27 ENCOUNTER — Inpatient Hospital Stay: Admission: RE | Admit: 2022-02-27 | Payer: Medicare Other | Source: Ambulatory Visit

## 2022-05-21 ENCOUNTER — Ambulatory Visit
Admission: RE | Admit: 2022-05-21 | Discharge: 2022-05-21 | Disposition: A | Discharge status: Home / Self Care | Payer: Medicare (Managed Care) | Source: Ambulatory Visit | Attending: Registered Nurse | Admitting: Registered Nurse

## 2022-05-21 DIAGNOSIS — R911 Solitary pulmonary nodule: Secondary | ICD-10-CM

## 2022-12-28 ENCOUNTER — Ambulatory Visit (HOSPITAL_COMMUNITY)
Admission: EM | Admit: 2022-12-28 | Discharge: 2022-12-28 | Disposition: A | Discharge status: Home / Self Care | Payer: Medicare (Managed Care)

## 2022-12-28 ENCOUNTER — Encounter (HOSPITAL_COMMUNITY): Payer: Self-pay | Admitting: Emergency Medicine

## 2022-12-28 DIAGNOSIS — U071 COVID-19: Secondary | ICD-10-CM | POA: Diagnosis not present

## 2022-12-28 LAB — POC COVID19/FLU A&B COMBO
Covid Antigen, POC: POSITIVE — AB
Influenza A Antigen, POC: NEGATIVE
Influenza B Antigen, POC: NEGATIVE

## 2022-12-28 MED ORDER — ACETAMINOPHEN 325 MG PO TABS
975.0000 mg | ORAL_TABLET | Freq: Once | ORAL | Status: AC
  Administered 2022-12-28: 975 mg via ORAL

## 2022-12-28 MED ORDER — IBUPROFEN 800 MG PO TABS
800.0000 mg | ORAL_TABLET | Freq: Once | ORAL | Status: AC
Start: 2022-12-28 — End: 2022-12-28
  Administered 2022-12-28: 800 mg via ORAL

## 2022-12-28 MED ORDER — ACETAMINOPHEN 325 MG PO TABS
ORAL_TABLET | ORAL | Status: AC
  Filled 2022-12-28: qty 3

## 2022-12-28 MED ORDER — IBUPROFEN 800 MG PO TABS
ORAL_TABLET | ORAL | Status: AC
  Filled 2022-12-28: qty 1

## 2022-12-28 MED ORDER — PAXLOVID (300/100) 20 X 150 MG & 10 X 100MG PO TBPK: 3.0000 | ORAL_TABLET | Freq: Two times a day (BID) | ORAL | 0 refills | Status: DC

## 2022-12-28 MED ORDER — PAXLOVID (300/100) 20 X 150 MG & 10 X 100MG PO TBPK: ORAL_TABLET | ORAL | 0 refills | Status: DC

## 2022-12-28 NOTE — ED Provider Notes (Signed)
[facility]    CSN: [id number] Arrival date & time: 12/28/22  1906      History   Chief Complaint Chief Complaint  Patient presents with   Cough    HPI Deborah Holmes is a 72 y.o. female.   Patient presents to clinic for a nonproductive cough and chills that started suddenly yesterday.  Her son visited for Christmas and was not feeling well, he recently tested positive for COVID-19.  Patient has been having hot and cold chills.  She does not have any taste.  She has not tried any medications or interventions for her symptoms.    The history is provided by medical records and the patient.  Cough   Past Medical History:  Diagnosis Date   CHF (congestive heart failure) (HCC)    Hypertension     Patient Active Problem List   Diagnosis Date Noted   Primary osteoarthritis of left knee 04/07/2019   Body mass index 40.0-44.9, adult (HCC) 04/07/2019   Morbid obesity (HCC) 04/07/2019   DJD (degenerative joint disease) of knee 03/24/2019   Lumbar spondylosis 03/24/2019   Facet hypertrophy of lumbosacral region 03/24/2019   Chronic combined systolic (congestive) and diastolic (congestive) heart failure (HCC) 01/07/2019   Obesity, Class III, BMI 40-49.9 (morbid obesity) (HCC) 01/07/2019   Suspected COVID-19 virus infection 01/07/2019   Acute respiratory failure due to COVID-19 (HCC) 01/07/2019   Anemia 12/03/2014   CHF exacerbation (HCC) 12/03/2014   Hypertension 12/03/2014   Acute on chronic combined systolic and diastolic heart failure (HCC)    Rotator cuff syndrome of right shoulder 04/26/2012   S/P TAH-BSO 04/26/2012   Obesity 04/26/2012   Chronic systolic heart failure (HCC) 10/17/2010   Hypertensive heart disease 04/16/2000    Past Surgical History:  Procedure Laterality Date   COLONOSCOPY     ROTATOR CUFF REPAIR      OB History   No obstetric history on file.      Home Medications    Prior to Admission medications   Medication Sig  Start Date End Date Taking? Authorizing Provider  aspirin EC 81 MG tablet Take 1 tablet by mouth daily. 01/31/17  Yes [provider]  amLODipine (NORVASC) 5 MG tablet Take 1 tablet (5 mg total) by mouth daily. 01/07/20 08/24/21  Barbette Merino, NP  benzonatate (TESSALON) 100 MG capsule Take 1 capsule (100 mg total) by mouth every 8 (eight) hours. 08/24/21   Carlisle Beers, FNP  carvedilol (COREG) 25 MG tablet Take 1 tablet (25 mg total) by mouth 2 (two) times daily. 06/02/20 08/24/21  Barbette Merino, NP  celecoxib (CELEBREX) 100 MG capsule Take 100 mg by mouth 2 (two) times daily.    [provider]  cetirizine (ZYRTEC) 10 MG tablet Take 1 tablet (10 mg total) by mouth daily. 08/24/21   Carlisle Beers, FNP  fluticasone (FLONASE) 50 MCG/ACT nasal spray Place 1 spray into both nostrils daily. 08/24/21   Carlisle Beers, FNP  furosemide (LASIX) 20 MG tablet Take 1 tablet (20 mg total) by mouth daily. 01/07/20 08/24/21  Barbette Merino, NP  losartan (COZAAR) 100 MG tablet Take 1 tablet (100 mg total) by mouth daily. 01/07/20 08/24/21  Barbette Merino, NP  Multiple Vitamin (MULTIVITAMIN WITH MINERALS) TABS tablet Take 1 tablet by mouth daily. 01/13/19   Alessandra Bevels, MD  nirmatrelvir/ritonavir (PAXLOVID, 300/100,) 20 x 150 MG & 10 x 100MG  TBPK Patient GFR is 75 Take nirmatrelvir (150 mg) two tablets  twice daily for 5 days and ritonavir (100 mg) one tablet twice daily for 5 days 12/28/22   Dody Smartt, [place] N, FNP  vitamin B-12 1000 MCG tablet Take 1 tablet (1,000 mcg total) by mouth daily. 12/05/14   Renae Fickle, MD    Family History Family History  Problem Relation Age of Onset   CAD Mother    Sickle cell anemia Mother    CAD Father    Diabetes type II Father    Colon cancer Neg Hx    Esophageal cancer Neg Hx    Rectal cancer Neg Hx    Stomach cancer Neg Hx     Social History Social History   Tobacco Use   Smoking status: Never    Passive exposure: Yes    Smokeless tobacco: Never  Vaping Use   Vaping status: Never Used  Substance Use Topics   Alcohol use: No   Drug use: No     Allergies   Patient has no known allergies.   Review of Systems Review of Systems  Per HPI   Physical Exam Triage Vital Signs ED Triage Vitals  Encounter Vitals Group     BP 12/28/22 1916 (!) 97/59     Systolic BP Percentile --      Diastolic BP Percentile --      Pulse Rate 12/28/22 1916 (!) 109     Resp 12/28/22 1916 20     Temp 12/28/22 1916 (!) 100.9 F (38.3 C)     Temp Source 12/28/22 1916 Oral     SpO2 12/28/22 1916 94 %     Weight --      Height --      Head Circumference --      Peak Flow --      Pain Score 12/28/22 1915 10     Pain Loc --      Pain Education --      Exclude from Growth Chart --    No data found.  Updated Vital Signs BP (!) 97/59 (BP Location: Right Arm)   Pulse (!) 109   Temp (!) 101.1 F (38.4 C) (Oral)   Resp 20   SpO2 94%   Visual Acuity Right Eye Distance:   Left Eye Distance:   Bilateral Distance:    Right Eye Near:   Left Eye Near:    Bilateral Near:     Physical Exam Vitals and nursing note reviewed.  Constitutional:      Appearance: Normal appearance.  HENT:     Head: Normocephalic and atraumatic.     Right Ear: External ear normal.     Left Ear: External ear normal.     Nose: Nose normal.     Mouth/Throat:     Mouth: Mucous membranes are moist.  Eyes:     Conjunctiva/sclera: Conjunctivae normal.  Cardiovascular:     Rate and Rhythm: Normal rate and regular rhythm.     Heart sounds: Normal heart sounds. No murmur heard. Pulmonary:     Effort: Pulmonary effort is normal. No respiratory distress.     Breath sounds: Normal breath sounds.  Musculoskeletal:        General: Normal range of motion.  Skin:    General: Skin is warm and dry.  Neurological:     General: No focal deficit present.     Mental Status: She is alert and oriented to person, place, and time.  Psychiatric:         Mood and Affect: Mood  normal.        Behavior: Behavior normal.      UC Treatments / Results  Labs (all labs ordered are listed, but only abnormal results are displayed) Labs Reviewed  POC COVID19/FLU A&B COMBO - Abnormal; Notable for the following components:      Result Value   Covid Antigen, POC Positive (*)    All other components within normal limits    EKG   Radiology No results found.  Procedures Procedures (including critical care time)  Medications Ordered in UC Medications  acetaminophen (TYLENOL) tablet 975 mg (975 mg Oral Given 12/28/22 1922)  ibuprofen (ADVIL) tablet 800 mg (800 mg Oral Given 12/28/22 1951)    Initial Impression / Assessment and Plan / UC Course  I have reviewed the triage vital signs and the nursing notes.  Pertinent labs & imaging results that were available during my care of the patient were reviewed by me and considered in my medical decision making (see chart for details).  Vitals and triage reviewed, patient is febrile and was given Tylenol in clinic, temperature has not improved, given ibuprofen.  Lungs are vesicular, patient is slightly tachycardic but febrile.  POC COVID testing is positive, will start on Paxlovid.  Advised to half all blood pressure medications or discontinue them if dizziness occurs.  Temperature improved after ibuprofen, comfortable to discharge.  Encouraged oral rehydration and viral management.  Strict emergency and follow-up precautions given.  No questions at this time.     Final Clinical Impressions(s) / UC Diagnoses   Final diagnoses:  COVID-19 virus infection     Discharge Instructions      Your COVID-19 testing was positive.  You can alternate between 500 mg of Tylenol and 600 mg of ibuprofen every 4-6 hours for any fever, body aches or chills.  Please start the antiviral as soon as possible.  Your blood pressure was borderline low in clinic, Paxlovid may further lower your blood pressure.  Please  have all of your blood pressure medications until finishing Paxlovid.  Stop taking the Paxlovid if you develop any dizziness.  Seek immediate care if you develop any loss of consciousness or new concerning symptoms.     ED Prescriptions     Medication Sig Dispense Auth. Provider   nirmatrelvir/ritonavir (PAXLOVID, 300/100,) 20 x 150 MG & 10 x 100MG  TBPK  (Status: Discontinued) Take 3 tablets by mouth 2 (two) times daily for 5 days. Patient GFR is 75. Take nirmatrelvir (150 mg) two tablets twice daily for 5 days and ritonavir (100 mg) one tablet twice daily for 5 days. 30 tablet Rinaldo Ratel, [place] N, [place]   nirmatrelvir/ritonavir (PAXLOVID, 300/100,) 20 x 150 MG & 10 x 100MG  TBPK  (Status: Discontinued) Take 3 tablets by mouth 2 (two) times daily for 5 days. Patient GFR is 75. Take nirmatrelvir (150 mg) two tablets twice daily for 5 days and ritonavir (100 mg) one tablet twice daily for 5 days. 30 tablet Rinaldo Ratel, [place] N, [place]   nirmatrelvir/ritonavir (PAXLOVID, 300/100,) 20 x 150 MG & 10 x 100MG  TBPK Patient GFR is 75 Take nirmatrelvir (150 mg) two tablets twice daily for 5 days and ritonavir (100 mg) one tablet twice daily for 5 days 30 tablet Valor [place], [place] N, [place]      PDMP not reviewed this encounter.   Shakira Los, [place] N, [place] 12/28/22 2014

## 2022-12-28 NOTE — Discharge Instructions (Addendum)
Your COVID-19 testing was positive.  You can alternate between 500 mg of Tylenol and 600 mg of ibuprofen every 4-6 hours for any fever, body aches or chills.  Please start the antiviral as soon as possible.  Your blood pressure was borderline low in clinic, Paxlovid may further lower your blood pressure.  Please have all of your blood pressure medications until finishing Paxlovid.  Stop taking the Paxlovid if you develop any dizziness.  Seek immediate care if you develop any loss of consciousness or new concerning symptoms.

## 2022-12-28 NOTE — ED Triage Notes (Signed)
Pt c/o dry cough and chills since yesterday. Denies taking any medications for her symptoms.

## 2023-07-19 ENCOUNTER — Ambulatory Visit (HOSPITAL_COMMUNITY)
Admission: EM | Admit: 2023-07-19 | Discharge: 2023-07-19 | Disposition: A | Discharge status: Home / Self Care | Payer: Medicare (Managed Care) | Attending: Emergency Medicine | Admitting: Emergency Medicine

## 2023-07-19 ENCOUNTER — Ambulatory Visit (INDEPENDENT_AMBULATORY_CARE_PROVIDER_SITE_OTHER): Payer: Medicare (Managed Care)

## 2023-07-19 ENCOUNTER — Encounter (HOSPITAL_COMMUNITY): Payer: Self-pay | Admitting: *Deleted

## 2023-07-19 DIAGNOSIS — M25561 Pain in right knee: Secondary | ICD-10-CM

## 2023-07-19 DIAGNOSIS — M175 Other unilateral secondary osteoarthritis of knee: Secondary | ICD-10-CM | POA: Diagnosis not present

## 2023-07-19 MED ORDER — MELOXICAM 7.5 MG PO TABS: 7.5000 mg | ORAL_TABLET | Freq: Every day | ORAL | 0 refills | Status: DC

## 2023-07-19 MED ORDER — DICLOFENAC SODIUM 1 % EX GEL: 2.0000 g | Freq: Four times a day (QID) | CUTANEOUS | 0 refills | Status: AC

## 2023-07-19 NOTE — ED Triage Notes (Signed)
 Pt states she has had right knee pain since riding in a car back seat on Saturday. She denies any injury. She hasn't taken any meds.

## 2023-07-19 NOTE — Discharge Instructions (Signed)
 Your x-ray is negative for any underlying fracture or dislocation.  It does reveal worsening osteoarthritis. You can apply Voltaren gel up to 4 times daily as needed for knee pain. Take meloxicam  once daily to help with knee pain. You can also take 500 mg of Tylenol  every 6-8 hours as needed for pain. Wear knee brace to help with pain. Use walker to help with walking versus crutches. Rest, ice, and elevate  often. You can follow-up with [facility] for further evaluation management of this. Otherwise follow-up with your primary care provider or return here as needed.

## 2023-07-19 NOTE — ED Provider Notes (Signed)
 [facility]    CSN: [id number] Arrival date & time: 07/19/23  1017      History   Chief Complaint Chief Complaint  Patient presents with   Knee Pain    HPI Deborah Holmes is a 73 y.o. female.   Patient presents with right knee pain and swelling that began on 7/12.  Patient states that she was riding in a car traveling from [place]  for a few hours and then upon getting out of the car she began to have some right knee pain.  Patient denies any recent falls or direct injury to the knee.  Patient reports that she has been using crutches to walk around due to pain worse with weightbearing.  Upon chart review patient has had previous x-rays of her right knee which revealed lateral compartment and patellofemoral compartment osteoarthritis.  Patient states that she does not typically have chronic pain to her right knee because of this.  Patient denies taking any medication for pain.  The history is provided by the patient and medical records.  Knee Pain   Past Medical History:  Diagnosis Date   CHF (congestive heart failure) (HCC)    Hypertension     Patient Active Problem List   Diagnosis Date Noted   Primary osteoarthritis of left knee 04/07/2019   Body mass index 40.0-44.9, adult (HCC) 04/07/2019   Morbid obesity (HCC) 04/07/2019   DJD (degenerative joint disease) of knee 03/24/2019   Lumbar spondylosis 03/24/2019   Facet hypertrophy of lumbosacral region 03/24/2019   Chronic combined systolic (congestive) and diastolic (congestive) heart failure (HCC) 01/07/2019   Obesity, Class III, BMI 40-49.9 (morbid obesity) 01/07/2019   Suspected COVID-19 virus infection 01/07/2019   Acute respiratory failure due to COVID-19 (HCC) 01/07/2019   Anemia 12/03/2014   CHF exacerbation (HCC) 12/03/2014   Hypertension 12/03/2014   Acute on chronic combined systolic and diastolic heart failure (HCC)    Rotator cuff syndrome of right shoulder 04/26/2012   S/P TAH-BSO 04/26/2012    Obesity 04/26/2012   Chronic systolic heart failure (HCC) 10/17/2010   Hypertensive heart disease 04/16/2000    Past Surgical History:  Procedure Laterality Date   COLONOSCOPY     ROTATOR CUFF REPAIR      OB History   No obstetric history on file.      Home Medications    Prior to Admission medications   Medication Sig Start Date End Date Taking? Authorizing Provider  amLODipine  (NORVASC ) 5 MG tablet Take 1 tablet (5 mg total) by mouth daily. 01/07/20 07/19/23 Yes Myrna Camelia HERO, NP  aspirin  EC 81 MG tablet Take 1 tablet by mouth daily. 01/31/17  Yes [provider]  carvedilol  (COREG ) 25 MG tablet Take 1 tablet (25 mg total) by mouth 2 (two) times daily. 06/02/20 07/19/23 Yes Myrna Camelia HERO, NP  diclofenac Sodium (VOLTAREN ARTHRITIS PAIN) 1 % GEL Apply 2 g topically 4 (four) times daily. 07/19/23  Yes Johnie, Hagop Mccollam A, NP  furosemide  (LASIX ) 20 MG tablet Take 1 tablet (20 mg total) by mouth daily. 01/07/20 07/19/23 Yes Myrna Camelia HERO, NP  losartan  (COZAAR ) 100 MG tablet Take 1 tablet (100 mg total) by mouth daily. 01/07/20 07/19/23 Yes Myrna Camelia HERO, NP  meloxicam  (MOBIC ) 7.5 MG tablet Take 1 tablet (7.5 mg total) by mouth daily. 07/19/23  Yes Johnie Flaming A, NP  Multiple Vitamin (MULTIVITAMIN WITH MINERALS) TABS tablet Take 1 tablet by mouth daily. 01/13/19  Yes Allana Gins, MD  vitamin B-12 1000  MCG tablet Take 1 tablet (1,000 mcg total) by mouth daily. 12/05/14  Yes Short, Jacquline, MD  benzonatate  (TESSALON ) 100 MG capsule Take 1 capsule (100 mg total) by mouth every 8 (eight) hours. 08/24/21   Enedelia Dorna HERO, FNP  cetirizine  (ZYRTEC ) 10 MG tablet Take 1 tablet (10 mg total) by mouth daily. 08/24/21   Enedelia Dorna HERO, FNP  fluticasone  (FLONASE ) 50 MCG/ACT nasal spray Place 1 spray into both nostrils daily. 08/24/21   Enedelia Dorna HERO, FNP    Family History Family History  Problem Relation Age of Onset   CAD Mother    Sickle cell anemia Mother     CAD Father    Diabetes type II Father    Colon cancer Neg Hx    Esophageal cancer Neg Hx    Rectal cancer Neg Hx    Stomach cancer Neg Hx     Social History Social History   Tobacco Use   Smoking status: Never    Passive exposure: Yes   Smokeless tobacco: Never  Vaping Use   Vaping status: Never Used  Substance Use Topics   Alcohol use: No   Drug use: No     Allergies   Patient has no known allergies.   Review of Systems Review of Systems  Per HPI  Physical Exam Triage Vital Signs ED Triage Vitals  Encounter Vitals Group     BP 07/19/23 1030 121/69     Girls Systolic BP Percentile --      Girls Diastolic BP Percentile --      Boys Systolic BP Percentile --      Boys Diastolic BP Percentile --      Pulse Rate 07/19/23 1030 83     Resp 07/19/23 1030 18     Temp 07/19/23 1030 98 F (36.7 C)     Temp Source 07/19/23 1030 Oral     SpO2 07/19/23 1030 100 %     Weight --      Height --      Head Circumference --      Peak Flow --      Pain Score 07/19/23 1028 10     Pain Loc --      Pain Education --      Exclude from Growth Chart --    No data found.  Updated Vital Signs BP 121/69 (BP Location: Right Arm)   Pulse 83   Temp 98 F (36.7 C) (Oral)   Resp 18   SpO2 100%   Visual Acuity Right Eye Distance:   Left Eye Distance:   Bilateral Distance:    Right Eye Near:   Left Eye Near:    Bilateral Near:     Physical Exam Vitals and nursing note reviewed.  Constitutional:      General: She is awake. She is not in acute distress.    Appearance: Normal appearance. She is well-developed and well-groomed. She is not ill-appearing.  Musculoskeletal:     Right knee: Swelling and bony tenderness present. No deformity, erythema or ecchymosis. Normal range of motion. Tenderness present over the medial joint line and lateral joint line.     Comments: Generalized swelling and tenderness noted to right knee.  Skin:    General: Skin is warm and dry.   Neurological:     Mental Status: She is alert.  Psychiatric:        Behavior: Behavior is cooperative.      UC Treatments / Results  Labs (all  labs ordered are listed, but only abnormal results are displayed) Labs Reviewed - No data to display  EKG   Radiology DG Knee Complete 4 Views Right Result Date: 07/19/2023 CLINICAL DATA:  Knee pain and swelling. EXAM: RIGHT KNEE - COMPLETE 4+ VIEW COMPARISON:  10/26/2021. FINDINGS: There is a moderate joint effusion. Slight varus angulation of the knee. Chondrocalcinosis in the medial and lateral compartments. Tricompartment osteophytosis. Mild to moderate lateral joint space narrowing with subchondral sclerosis. IMPRESSION: 1. Moderate joint effusion. 2. Tricompartment osteoarthritis, worst laterally. Electronically Signed   By: Newell Eke M.D.   On: 07/19/2023 11:29    Procedures Procedures (including critical care time)  Medications Ordered in UC Medications - No data to display  Initial Impression / Assessment and Plan / UC Course  I have reviewed the triage vital signs and the nursing notes.  Pertinent labs & imaging results that were available during my care of the patient were reviewed by me and considered in my medical decision making (see chart for details).     Patient is overall well-appearing.  Vitals are stable.  X-ray ordered.  Based on my interpretation there is evidence of worsening osteoarthritis on x-ray.  Radiology report feels moderate joint effusion with tricompartment osteoarthritis that is worsened laterally.  Provided patient with knee brace and walker.  Prescribed Voltaren gel and meloxicam  as needed for pain.  Recommended Tylenol  as needed for pain as well.  Given orthopedic follow-up.  Discussed follow-up and return precautions. Final Clinical Impressions(s) / UC Diagnoses   Final diagnoses:  Acute pain of right knee  Other secondary osteoarthritis of right knee     Discharge Instructions       Your x-ray is negative for any underlying fracture or dislocation.  It does reveal worsening osteoarthritis. You can apply Voltaren gel up to 4 times daily as needed for knee pain. Take meloxicam  once daily to help with knee pain. You can also take 500 mg of Tylenol  every 6-8 hours as needed for pain. Wear knee brace to help with pain. Use walker to help with walking versus crutches. Rest, ice, and elevate  often. You can follow-up with [facility] for further evaluation management of this. Otherwise follow-up with your primary care provider or return here as needed.   ED Prescriptions     Medication Sig Dispense Auth. Provider   diclofenac Sodium (VOLTAREN ARTHRITIS PAIN) 1 % GEL Apply 2 g topically 4 (four) times daily. 20 g Johnie Flaming A, NP   meloxicam  (MOBIC ) 7.5 MG tablet Take 1 tablet (7.5 mg total) by mouth daily. 20 tablet Johnie Flaming A, NP      PDMP not reviewed this encounter.   Johnie Flaming A, NP 07/19/23 1148

## 2023-12-08 ENCOUNTER — Ambulatory Visit (HOSPITAL_COMMUNITY): Payer: Medicare (Managed Care)

## 2023-12-08 ENCOUNTER — Ambulatory Visit (HOSPITAL_COMMUNITY)
Admission: EM | Admit: 2023-12-08 | Discharge: 2023-12-08 | Disposition: A | Discharge status: Home / Self Care | Payer: Medicare (Managed Care) | Attending: Internal Medicine | Admitting: Internal Medicine

## 2023-12-08 ENCOUNTER — Encounter (HOSPITAL_COMMUNITY): Payer: Self-pay

## 2023-12-08 DIAGNOSIS — M19072 Primary osteoarthritis, left ankle and foot: Secondary | ICD-10-CM | POA: Diagnosis not present

## 2023-12-08 DIAGNOSIS — M79672 Pain in left foot: Secondary | ICD-10-CM | POA: Diagnosis not present

## 2023-12-08 LAB — BASIC METABOLIC PANEL WITH GFR
Anion gap: 15 (ref 5–15)
BUN: 14 mg/dL (ref 8–23)
CO2: 24 mmol/L (ref 22–32)
Calcium: 9.6 mg/dL (ref 8.9–10.3)
Chloride: 104 mmol/L (ref 98–111)
Creatinine, Ser: 0.93 mg/dL (ref 0.44–1.00)
GFR, Estimated: >60 mL/min (ref >60)
Glucose, Bld: 100 mg/dL — ABNORMAL HIGH (ref 70–99)
Potassium: 3.5 mmol/L (ref 3.5–5.1)
Sodium: 143 mmol/L (ref 135–145)

## 2023-12-08 MED ORDER — MELOXICAM 15 MG PO TABS: 15.0000 mg | ORAL_TABLET | Freq: Every day | ORAL | 0 refills | Status: AC

## 2023-12-08 NOTE — ED Provider Notes (Signed)
 [facility]    CSN: [id number] Arrival date & time: 12/08/23  1113      History   Chief Complaint Chief Complaint  Patient presents with  . Foot Pain    HPI Deborah Holmes is a 73 y.o. female.   Deborah Holmes is a 73 y.o. female presenting for chief complaint of left foot pain that started last night without trauma or injury.  Pain is localized to the sole of the left midfoot and does not radiate to the forefoot or the hindfoot.  No pain of the left ankle.  She has not injured the left foot recently.  Pain improves when she is performing weightbearing activity on the left foot and worsens when she sits down.  Pain is constant, is described as a sharp pain, and and is currently a 10 on a scale of 0-10. History of bone on bone osteoarthritis pain of the knees for which she takes meloxicam  daily.  She states meloxicam  is not helping very much with her left foot pain. She denies associated redness, swelling, or open wounds to the left foot.  Denies paresthesias to the distal left foot.  She has not stepped on anything recently to trigger the pain. Denies history of gouty arthritis.  She was on her feet for a long period of time cooking Thanksgiving meal 2 weeks ago, otherwise denies recent long periods of standing on her feet.  Taking meloxicam  as stated above with minimal relief of symptoms.   Foot Pain    Past Medical History:  Diagnosis Date  . CHF (congestive heart failure) (HCC)   . Hypertension     Patient Active Problem List   Diagnosis Date Noted  . Primary osteoarthritis of left knee 04/07/2019  . Body mass index 40.0-44.9, adult (HCC) 04/07/2019  . Morbid obesity (HCC) 04/07/2019  . DJD (degenerative joint disease) of knee 03/24/2019  . Lumbar spondylosis 03/24/2019  . Facet hypertrophy of lumbosacral region 03/24/2019  . Chronic combined systolic (congestive) and diastolic (congestive) heart failure (HCC) 01/07/2019  . Obesity, Class III, BMI  40-49.9 (morbid obesity) (HCC) 01/07/2019  . Suspected COVID-19 virus infection 01/07/2019  . Acute respiratory failure due to COVID-19 (HCC) 01/07/2019  . Anemia 12/03/2014  . CHF exacerbation (HCC) 12/03/2014  . Hypertension 12/03/2014  . Acute on chronic combined systolic and diastolic heart failure (HCC)   . Rotator cuff syndrome of right shoulder 04/26/2012  . S/P TAH-BSO 04/26/2012  . Obesity 04/26/2012  . Chronic systolic heart failure (HCC) 10/17/2010  . Hypertensive heart disease 04/16/2000    Past Surgical History:  Procedure Laterality Date  . COLONOSCOPY    . ROTATOR CUFF REPAIR      OB History   No obstetric history on file.      Home Medications    Prior to Admission medications   Medication Sig Start Date End Date Taking? Authorizing Provider  aspirin  EC 81 MG tablet Take 1 tablet by mouth daily. 01/31/17  Yes [provider]  benzonatate  (TESSALON ) 100 MG capsule Take 1 capsule (100 mg total) by mouth every 8 (eight) hours. 08/24/21  Yes Enedelia Dorna HERO, FNP  cetirizine  (ZYRTEC ) 10 MG tablet Take 1 tablet (10 mg total) by mouth daily. 08/24/21  Yes Enedelia Dorna HERO, FNP  diclofenac  Sodium (VOLTAREN  ARTHRITIS PAIN) 1 % GEL Apply 2 g topically 4 (four) times daily. 07/19/23  Yes Johnie, Carley A, NP  fluticasone  (FLONASE ) 50 MCG/ACT nasal spray Place 1 spray into both nostrils  daily. 08/24/21  Yes Enedelia Dorna HERO, FNP  meloxicam  (MOBIC ) 7.5 MG tablet Take 1 tablet (7.5 mg total) by mouth daily. 07/19/23  Yes Johnie Flaming A, NP  Multiple Vitamin (MULTIVITAMIN WITH MINERALS) TABS tablet Take 1 tablet by mouth daily. 01/13/19  Yes Allana Gins, MD  vitamin B-12 1000 MCG tablet Take 1 tablet (1,000 mcg total) by mouth daily. 12/05/14  Yes Short, Jacquline, MD  amLODipine  (NORVASC ) 5 MG tablet Take 1 tablet (5 mg total) by mouth daily. 01/07/20 07/19/23  Myrna Camelia HERO, NP  carvedilol  (COREG ) 25 MG tablet Take 1 tablet (25 mg total) by mouth  2 (two) times daily. 06/02/20 07/19/23  Myrna Camelia HERO, NP  furosemide  (LASIX ) 20 MG tablet Take 1 tablet (20 mg total) by mouth daily. 01/07/20 07/19/23  Myrna Camelia HERO, NP  losartan  (COZAAR ) 100 MG tablet Take 1 tablet (100 mg total) by mouth daily. 01/07/20 07/19/23  Myrna Camelia HERO, NP    Family History Family History  Problem Relation Age of Onset  . CAD Mother   . Sickle cell anemia Mother   . CAD Father   . Diabetes type II Father   . Colon cancer Neg Hx   . Esophageal cancer Neg Hx   . Rectal cancer Neg Hx   . Stomach cancer Neg Hx     Social History Social History   Tobacco Use  . Smoking status: Never    Passive exposure: Yes  . Smokeless tobacco: Never  Vaping Use  . Vaping status: Never Used  Substance Use Topics  . Alcohol use: No  . Drug use: No     Allergies   Patient has no known allergies.   Review of Systems Review of Systems Per HPI  Physical Exam Triage Vital Signs ED Triage Vitals  Encounter Vitals Group     BP 12/08/23 1150 95/61     Girls Systolic BP Percentile --      Girls Diastolic BP Percentile --      Boys Systolic BP Percentile --      Boys Diastolic BP Percentile --      Pulse Rate 12/08/23 1150 76     Resp 12/08/23 1150 18     Temp 12/08/23 1150 97.8 F (36.6 C)     Temp Source 12/08/23 1150 Oral     SpO2 12/08/23 1150 93 %     Weight 12/08/23 1150 234 lb (106.1 kg)     Height 12/08/23 1150 5' 6 (1.676 m)     Head Circumference --      Peak Flow --      Pain Score 12/08/23 1149 10     Pain Loc --      Pain Education --      Exclude from Growth Chart --    No data found.  Updated Vital Signs BP 95/61 (BP Location: Left Arm)   Pulse 76   Temp 97.8 F (36.6 C) (Oral)   Resp 18   Ht 5' 6 (1.676 m)   Wt 234 lb (106.1 kg)   SpO2 93%   BMI 37.77 kg/m   Visual Acuity Right Eye Distance:   Left Eye Distance:   Bilateral Distance:    Right Eye Near:   Left Eye Near:    Bilateral Near:     Physical Exam Vitals and  nursing note reviewed.  Constitutional:      Appearance: She is not ill-appearing or toxic-appearing.  HENT:     Head: Normocephalic  and atraumatic.     Right Ear: Hearing and external ear normal.     Left Ear: Hearing and external ear normal.     Nose: Nose normal.     Mouth/Throat:     Lips: Pink.  Eyes:     General: Lids are normal. Vision grossly intact. Gaze aligned appropriately.     Extraocular Movements: Extraocular movements intact.     Conjunctiva/sclera: Conjunctivae normal.  Pulmonary:     Effort: Pulmonary effort is normal.  Musculoskeletal:     Cervical back: Neck supple.     Right foot: Normal.     Left foot: Normal range of motion and normal capillary refill. No swelling, deformity, bunion, Charcot foot, foot drop, prominent metatarsal heads, laceration, tenderness, bony tenderness or crepitus. Normal pulse.     Comments: Left foot: Pain to the left foot is not reproducible to palpation. Non-tender to the plantar left foot. No erythema, soft tissue swelling. No open wounds. +2 bilateral dorsalis pedis pulses.   Skin:    General: Skin is warm and dry.     Capillary Refill: Capillary refill takes less than 2 seconds.     Findings: No rash.  Neurological:     General: No focal deficit present.     Mental Status: She is alert and oriented to person, place, and time. Mental status is at baseline.     Cranial Nerves: No dysarthria or facial asymmetry.  Psychiatric:        Mood and Affect: Mood normal.        Speech: Speech normal.        Behavior: Behavior normal.        Thought Content: Thought content normal.        Judgment: Judgment normal.      UC Treatments / Results  Labs (all labs ordered are listed, but only abnormal results are displayed) Labs Reviewed - No data to display  EKG   Radiology No results found.  Procedures Procedures (including critical care time)  Medications Ordered in UC Medications - No data to display  Initial Impression /  Assessment and Plan / UC Course  I have reviewed the triage vital signs and the nursing notes.  Pertinent labs & imaging results that were available during my care of the patient were reviewed by me and considered in my medical decision making (see chart for details).     *** Final Clinical Impressions(s) / UC Diagnoses   Final diagnoses:  Left foot pain   Discharge Instructions   None    ED Prescriptions   None    PDMP not reviewed this encounter.

## 2023-12-08 NOTE — ED Triage Notes (Signed)
 Presenting with left foot pain onset last night. States the pain has returned this morning and getting worse. No known recent injuries or accidents. Does have history of bone on bone pain in the left knee.   Patient has not taken anything for pain.

## 2023-12-08 NOTE — Discharge Instructions (Addendum)
 Your foot pain is due to arthritis.  I'd like to increase your dose of mobic   today from 7.5mg  daily to 15mg  daily.  I have drawn blood work to make sure your kidney function can tolerate this dose increase.  Please schedule a follow-up appointment with [facility] for ongoing evaluation and management of your left foot pain.  Follow-up with PCP as needed.

## 2023-12-09 ENCOUNTER — Ambulatory Visit (HOSPITAL_COMMUNITY): Payer: Self-pay

## 2023-12-17 ENCOUNTER — Ambulatory Visit: Payer: Medicare (Managed Care) | Admitting: Podiatry

## 2023-12-24 ENCOUNTER — Ambulatory Visit: Payer: Medicare (Managed Care) | Admitting: Podiatry

## 2023-12-24 ENCOUNTER — Ambulatory Visit (INDEPENDENT_AMBULATORY_CARE_PROVIDER_SITE_OTHER): Payer: Medicare (Managed Care)

## 2023-12-24 ENCOUNTER — Ambulatory Visit: Payer: Medicare (Managed Care)

## 2023-12-24 DIAGNOSIS — M779 Enthesopathy, unspecified: Secondary | ICD-10-CM

## 2023-12-24 NOTE — Progress Notes (Signed)
 "  Subjective:  Patient ID: Deborah Holmes, female    DOB: [birth year],  MRN: [id number]  No chief complaint on file.   Discussed the use of AI scribe software for clinical note transcription with the patient, who gave verbal consent to proceed.  History of Present Illness Deborah Holmes is a 73 year old female with pain to her right hip and knee. She states her left foot was transiently painful but now doing well, she is not sure why she was referred here.  She reports chronic right-sided knee and hip pain that has persisted for a long time and describes it as involving the entire right side of the knee and hip region.  She has not had prior surgery or follow-up care for these symptoms.  She had prior mid and plantar right foot pain that has resolved and currently has no foot pain or discomfort.     Review of Systems: Negative except as noted in the HPI. Denies N/V/F/Ch.  Past Medical History:  Diagnosis Date   CHF (congestive heart failure) (HCC)    Hypertension    Current Medications[1]  Tobacco Use History[2]  Allergies[3] Objective:   Constitutional Well developed. Well nourished. Oriented to person, place, and time.  Vascular Dorsalis pedis pulses palpable bilaterally. Posterior tibial pulses palpable bilaterally. Capillary refill normal to all digits.  No cyanosis or clubbing noted. Pedal hair growth normal.  Neurologic Normal speech. Epicritic sensation to light touch grossly intact bilaterally. Negative tinel sign at tarsal tunnel bilaterally.   Dermatologic Skin texture and turgor are within normal limits.  No open wounds. No skin lesions.  Musculoskeletal: 5 out of 5 muscle strength all major pedal muscle groups.  Mild pes planus foot shape with calcaneus and valgus.  No pain to palpation of plantar arch today.  No pain with palpation anywhere.   Radiographs: Taken and reviewed.  3 weightbearing views of the left foot were taken today.  These show mild  pes planus with faulting at the naviculocuneiform joint and increased uncoverage at the talonavicular joint.  Mild arthritic changes throughout the hindfoot and midfoot.  No acute osseous abnormality such as fracture or dislocation       Assessment:   1. Tendinitis      Plan:  Patient was evaluated and treated and all questions answered.  Assessment and Plan Assessment & Plan Flat foot Pes planus confirmed on examination and radiography, currently asymptomatic without pain or functional limitation. - Advised use of supportive footwear if symptoms develop.  Right knee and hip pain Chronic right knee and hip pain without prior surgical intervention. Requires further evaluation. - Referred to orthopedic specialists at [facility] for further assessment of right knee and hip pain.   RTC PRN  Prentice Ovens, DPM AACFAS Fellowship Trained Podiatric Surgeon [facility]     [1]  Current Outpatient Medications:    amLODipine  (NORVASC ) 5 MG tablet, Take 1 tablet (5 mg total) by mouth daily., Disp: 90 tablet, Rfl: 3   aspirin  EC 81 MG tablet, Take 1 tablet by mouth daily., Disp: , Rfl:    benzonatate  (TESSALON ) 100 MG capsule, Take 1 capsule (100 mg total) by mouth every 8 (eight) hours., Disp: 21 capsule, Rfl: 0   carvedilol  (COREG ) 25 MG tablet, Take 1 tablet (25 mg total) by mouth 2 (two) times daily., Disp: 60 tablet, Rfl: 11   cetirizine  (ZYRTEC ) 10 MG tablet, Take 1 tablet (10 mg total) by mouth daily., Disp: 30 tablet, Rfl:  2   diclofenac  Sodium (VOLTAREN  ARTHRITIS PAIN) 1 % GEL, Apply 2 g topically 4 (four) times daily., Disp: 20 g, Rfl: 0   fluticasone  (FLONASE ) 50 MCG/ACT nasal spray, Place 1 spray into both nostrils daily., Disp: 16 g, Rfl: 2   furosemide  (LASIX ) 20 MG tablet, Take 1 tablet (20 mg total) by mouth daily., Disp: 90 tablet, Rfl: 3   losartan  (COZAAR ) 100 MG tablet, Take 1 tablet (100 mg total) by mouth daily., Disp: 90 tablet, Rfl: 3   meloxicam   (MOBIC ) 15 MG tablet, Take 1 tablet (15 mg total) by mouth daily., Disp: 30 tablet, Rfl: 0   Multiple Vitamin (MULTIVITAMIN WITH MINERALS) TABS tablet, Take 1 tablet by mouth daily., Disp: 30 tablet, Rfl: 0   vitamin B-12 1000 MCG tablet, Take 1 tablet (1,000 mcg total) by mouth daily., Disp: 30 tablet, Rfl: 0 [2]  Social History Tobacco Use  Smoking Status Never   Passive exposure: Yes  Smokeless Tobacco Never  [3] No Known Allergies  "

## 2024-01-15 ENCOUNTER — Ambulatory Visit: Payer: Medicare (Managed Care) | Admitting: Orthopaedic Surgery

## 2024-01-15 ENCOUNTER — Other Ambulatory Visit (INDEPENDENT_AMBULATORY_CARE_PROVIDER_SITE_OTHER): Payer: Medicare (Managed Care)

## 2024-01-15 ENCOUNTER — Other Ambulatory Visit: Payer: Self-pay

## 2024-01-15 DIAGNOSIS — M1711 Unilateral primary osteoarthritis, right knee: Secondary | ICD-10-CM

## 2024-01-15 DIAGNOSIS — M1611 Unilateral primary osteoarthritis, right hip: Secondary | ICD-10-CM

## 2024-01-15 MED ORDER — TRAMADOL HCL 50 MG PO TABS: 50.0000 mg | ORAL_TABLET | Freq: Every day | ORAL | 0 refills | Status: AC | PRN

## 2024-01-15 NOTE — Progress Notes (Signed)
 "  Office Visit Note   Patient: Deborah Holmes [facility]           Date of Birth: [birth year]           MRN: [id number] Visit Date: 01/15/2024              Requested by: Magdalen Prentice PARAS, DPM [address] [place],  [place] [address] PCP: Pcp, No   Assessment & Plan: Visit Diagnoses:  1. Primary osteoarthritis of right knee   2. Primary osteoarthritis of right hip     Plan: History of Present Illness Deborah Holmes is a 74 year old female who presents for evaluation of worsening right knee and hip pain.  She has chronic right knee and right hip pain, with the knee now most symptomatic. Knee pain involves the entire joint and is more severe than the hip pain. Symptoms have progressed to where she needs a cane for ambulation outside the home and uses it indoors when pain is significant.  She has not tried corticosteroid injections, tramadol , or surgery for either joint.  Right hip pain is localized to the buttock with occasional radiation to the groin and is less severe than the knee pain.  She denies nickel allergy and is not on anticoagulation.  Physical Exam MUSCULOSKELETAL: Right hip pain in buttock and groin with manipulation of the joint. Right knee pain with valgus alignment, lateral joint line tenderness.  Pain with ROM.    Assessment and Plan Primary osteoarthritis of right knee Severe primary osteoarthritis with valgus deformity. Condition is chronic, progressive, and significantly impairs mobility. Total knee arthroplasty indicated due to symptom severity and radiographic findings. No nickel allergy. Impression is severe right knee degenerative joint disease secondary to Osteoarthritis.  Patient has attempted conservative treatment for at least 6 consecutive weeks within the past 12 weeks, including but not limited to physical therapy, home exercise program, NSAIDs, activity modification, and/or corticosteroid injections. Despite these efforts, symptoms have not improved or have  worsened. Conservative measures have been deemed unsuccessful at this time. After a detailed discussion covering diagnosis and treatment options--including the risks, benefits, alternatives, and potential complications of surgical and nonsurgical management--the patient elected to proceed with surgery  Anticoagulants: No antithrombotic Postop anticoagulation: Eliquis Diabetic: No  Nickel allergy: No Prior DVT/PE: No Tobacco use: No Clearances needed for surgery: PCP, cardiologist Anticipated discharge dispo: Home   Primary osteoarthritis of right hip Primary osteoarthritis confirmed by radiography. Right hip less symptomatic than knee; current management focuses on knee. - Discussed potential progression of right hip symptoms and possible future need for hip arthroplasty.  Follow-Up Instructions: No follow-ups on file.   Orders:  Orders Placed This Encounter  Procedures   XR KNEE 3 VIEW RIGHT   XR HIP UNILAT W OR W/O PELVIS 2-3 VIEWS RIGHT   Meds ordered this encounter  Medications   traMADol  (ULTRAM ) 50 MG tablet    Sig: Take 1-2 tablets (50-100 mg total) by mouth daily as needed.    Dispense:  20 tablet    Refill:  0      Procedures: No procedures performed   Clinical Data: No additional findings.   Subjective: Chief Complaint  Patient presents with   Right Hip - Pain   Right Knee - Pain    HPI  Review of Systems  Constitutional: Negative.   HENT: Negative.    Eyes: Negative.   Respiratory: Negative.    Cardiovascular: Negative.   Endocrine: Negative.   Musculoskeletal: Negative.  Neurological: Negative.   Hematological: Negative.   Psychiatric/Behavioral: Negative.    All other systems reviewed and are negative.    Objective: Vital Signs: There were no vitals taken for this visit.  Physical Exam Vitals and nursing note reviewed.  Constitutional:      Appearance: She is well-developed.  HENT:     Head: Atraumatic.     Nose: Nose normal.   Eyes:     Extraocular Movements: Extraocular movements intact.  Cardiovascular:     Pulses: Normal pulses.  Pulmonary:     Effort: Pulmonary effort is normal.  Abdominal:     Palpations: Abdomen is soft.  Musculoskeletal:     Cervical back: Neck supple.  Skin:    General: Skin is warm.     Capillary Refill: Capillary refill takes less than 2 seconds.  Neurological:     Mental Status: She is alert. Mental status is at baseline.  Psychiatric:        Behavior: Behavior normal.        Thought Content: Thought content normal.        Judgment: Judgment normal.     Ortho Exam  Specialty Comments:  No specialty comments available.  Imaging: XR KNEE 3 VIEW RIGHT Result Date: 01/15/2024 X-rays demonstrate severe tricompartmental osteoarthritis.  Bone-on-bone joint space narrowing of lateral compartment with valgus deformity.  Kellgren-Lawrence stage IV  XR HIP UNILAT W OR W/O PELVIS 2-3 VIEWS RIGHT Result Date: 01/15/2024 Xrays show moderately severe arthritic changes of the right hip.    PMFS History: Patient Active Problem List   Diagnosis Date Noted   Primary osteoarthritis of left knee 04/07/2019   Body mass index 40.0-44.9, adult (HCC) 04/07/2019   Morbid obesity (HCC) 04/07/2019   DJD (degenerative joint disease) of knee 03/24/2019   Lumbar spondylosis 03/24/2019   Facet hypertrophy of lumbosacral region 03/24/2019   Chronic combined systolic (congestive) and diastolic (congestive) heart failure (HCC) 01/07/2019   Obesity, Class III, BMI 40-49.9 (morbid obesity) (HCC) 01/07/2019   Suspected COVID-19 virus infection 01/07/2019   Acute respiratory failure due to COVID-19 (HCC) 01/07/2019   Anemia 12/03/2014   CHF exacerbation (HCC) 12/03/2014   Hypertension 12/03/2014   Acute on chronic combined systolic and diastolic heart failure (HCC)    Rotator cuff syndrome of right shoulder 04/26/2012   S/P TAH-BSO 04/26/2012   Obesity 04/26/2012   Chronic systolic heart  failure (HCC) 89/83/7987   Hypertensive heart disease 04/16/2000   Past Medical History:  Diagnosis Date   CHF (congestive heart failure) (HCC)    Hypertension     Family History  Problem Relation Age of Onset   CAD Mother    Sickle cell anemia Mother    CAD Father    Diabetes type II Father    Colon cancer Neg Hx    Esophageal cancer Neg Hx    Rectal cancer Neg Hx    Stomach cancer Neg Hx     Past Surgical History:  Procedure Laterality Date   COLONOSCOPY     ROTATOR CUFF REPAIR     Social History   Occupational History   Not on file  Tobacco Use   Smoking status: Never    Passive exposure: Yes   Smokeless tobacco: Never  Vaping Use   Vaping status: Never Used  Substance and Sexual Activity   Alcohol use: No   Drug use: No   Sexual activity: Not Currently    Birth control/protection: None        "

## 2024-01-17 ENCOUNTER — Telehealth: Payer: Self-pay

## 2024-01-17 NOTE — Telephone Encounter (Signed)
 Patient was given clearance forms to take to Cardiology & PCP for future R TKA.
